# Patient Record
Sex: Female | Born: 1990
Health system: Southern US, Community
[De-identification: ages and names within clinical notes are randomized; demographics above are authoritative.]

## PROBLEM LIST (undated history)

## (undated) DIAGNOSIS — Z87442 Personal history of urinary calculi: Secondary | ICD-10-CM

## (undated) DIAGNOSIS — D649 Anemia, unspecified: Secondary | ICD-10-CM

## (undated) DIAGNOSIS — A048 Other specified bacterial intestinal infections: Secondary | ICD-10-CM

## (undated) HISTORY — DX: Other specified bacterial intestinal infections: A04.8

## (undated) HISTORY — PX: OTHER SURGICAL HISTORY: SHX169

---

## 2016-08-31 ENCOUNTER — Emergency Department (HOSPITAL_COMMUNITY): Payer: Self-pay

## 2016-08-31 ENCOUNTER — Emergency Department (HOSPITAL_COMMUNITY)
Admission: EM | Admit: 2016-08-31 | Discharge: 2016-08-31 | Disposition: A | Discharge status: Home / Self Care | Payer: Self-pay | Attending: Emergency Medicine | Admitting: Emergency Medicine

## 2016-08-31 ENCOUNTER — Encounter (HOSPITAL_COMMUNITY): Payer: Self-pay | Admitting: Emergency Medicine

## 2016-08-31 DIAGNOSIS — R112 Nausea with vomiting, unspecified: Secondary | ICD-10-CM

## 2016-08-31 DIAGNOSIS — N200 Calculus of kidney: Secondary | ICD-10-CM

## 2016-08-31 DIAGNOSIS — N201 Calculus of ureter: Secondary | ICD-10-CM | POA: Insufficient documentation

## 2016-08-31 DIAGNOSIS — R197 Diarrhea, unspecified: Secondary | ICD-10-CM | POA: Insufficient documentation

## 2016-08-31 LAB — URINALYSIS, ROUTINE W REFLEX MICROSCOPIC
Bilirubin Urine: NEGATIVE
Glucose, UA: NEGATIVE mg/dL
Ketones, ur: 20 mg/dL — AB
LEUKOCYTES UA: NEGATIVE
NITRITE: NEGATIVE
PROTEIN: 30 mg/dL — AB
SPECIFIC GRAVITY, URINE: 1.021 (ref 1.005–1.030)
pH: 5 (ref 5.0–8.0)

## 2016-08-31 LAB — CBC
HEMATOCRIT: 39.1 % (ref 36.0–46.0)
Hemoglobin: 12.9 g/dL (ref 12.0–15.0)
MCH: 28 pg (ref 26.0–34.0)
MCHC: 33 g/dL (ref 30.0–36.0)
MCV: 84.8 fL (ref 78.0–100.0)
PLATELETS: 377 10*3/uL (ref 150–400)
RBC: 4.61 MIL/uL (ref 3.87–5.11)
RDW: 13.6 % (ref 11.5–15.5)
WBC: 13.3 10*3/uL — AB (ref 4.0–10.5)

## 2016-08-31 LAB — I-STAT BETA HCG BLOOD, ED (MC, WL, AP ONLY)

## 2016-08-31 LAB — COMPREHENSIVE METABOLIC PANEL
ALBUMIN: 4.1 g/dL (ref 3.5–5.0)
ALT: 20 U/L (ref 14–54)
AST: 18 U/L (ref 15–41)
Alkaline Phosphatase: 39 U/L (ref 38–126)
Anion gap: 8 (ref 5–15)
BILIRUBIN TOTAL: 0.6 mg/dL (ref 0.3–1.2)
BUN: 7 mg/dL (ref 6–20)
CO2: 23 mmol/L (ref 22–32)
CREATININE: 0.86 mg/dL (ref 0.44–1.00)
Calcium: 9.1 mg/dL (ref 8.9–10.3)
Chloride: 107 mmol/L (ref 101–111)
GFR calc Af Amer: >60 mL/min (ref >60)
GLUCOSE: 155 mg/dL — AB (ref 65–99)
Potassium: 3.7 mmol/L (ref 3.5–5.1)
Sodium: 138 mmol/L (ref 135–145)
TOTAL PROTEIN: 6.9 g/dL (ref 6.5–8.1)

## 2016-08-31 LAB — LIPASE, BLOOD: Lipase: 27 U/L (ref 11–51)

## 2016-08-31 MED ORDER — NAPROXEN 500 MG PO TABS: 500.0000 mg | ORAL_TABLET | Freq: Two times a day (BID) | ORAL | 0 refills | Status: DC

## 2016-08-31 MED ORDER — HYDROCODONE-ACETAMINOPHEN 5-325 MG PO TABS: 1.0000 | ORAL_TABLET | Freq: Four times a day (QID) | ORAL | 0 refills | Status: DC | PRN

## 2016-08-31 MED ORDER — MORPHINE SULFATE (PF) 4 MG/ML IV SOLN
4.0000 mg | Freq: Once | INTRAVENOUS | Status: AC
  Administered 2016-08-31: 4 mg via INTRAVENOUS
  Filled 2016-08-31: qty 1

## 2016-08-31 MED ORDER — ONDANSETRON HCL 4 MG/2ML IJ SOLN
4.0000 mg | Freq: Once | INTRAMUSCULAR | Status: AC
  Administered 2016-08-31: 4 mg via INTRAVENOUS
  Filled 2016-08-31: qty 2

## 2016-08-31 MED ORDER — ONDANSETRON 4 MG PO TBDP: 4.0000 mg | ORAL_TABLET | Freq: Once | ORAL | Status: DC | PRN

## 2016-08-31 MED ORDER — ONDANSETRON 4 MG PO TBDP: 4.0000 mg | ORAL_TABLET | Freq: Three times a day (TID) | ORAL | 0 refills | Status: DC | PRN

## 2016-08-31 MED ORDER — KETOROLAC TROMETHAMINE 30 MG/ML IJ SOLN
30.0000 mg | Freq: Once | INTRAMUSCULAR | Status: AC
  Administered 2016-08-31: 30 mg via INTRAVENOUS
  Filled 2016-08-31: qty 1

## 2016-08-31 MED ORDER — SODIUM CHLORIDE 0.9 % IV BOLUS (SEPSIS)
1000.0000 mL | Freq: Once | INTRAVENOUS | Status: AC
  Administered 2016-08-31: 1000 mL via INTRAVENOUS

## 2016-08-31 MED ORDER — LACTATED RINGERS IV BOLUS (SEPSIS)
1000.0000 mL | Freq: Once | INTRAVENOUS | Status: AC
  Administered 2016-08-31: 1000 mL via INTRAVENOUS

## 2016-08-31 MED ORDER — FENTANYL CITRATE (PF) 100 MCG/2ML IJ SOLN
INTRAMUSCULAR | Status: AC
  Filled 2016-08-31: qty 2

## 2016-08-31 MED ORDER — FENTANYL CITRATE (PF) 100 MCG/2ML IJ SOLN
50.0000 ug | Freq: Once | INTRAMUSCULAR | Status: AC
  Administered 2016-08-31: 50 ug via INTRAVENOUS

## 2016-08-31 MED ORDER — ONDANSETRON 4 MG PO TBDP
ORAL_TABLET | ORAL | Status: AC
  Filled 2016-08-31: qty 1

## 2016-08-31 NOTE — ED Provider Notes (Signed)
[facility]-EMERGENCY DEPT Provider Note   CSN: [id number] Arrival date & time: 08/31/16  0253     History   Chief Complaint Chief Complaint  Patient presents with  . Abdominal Pain    HPI Stephanie Young is a 26 y.o. female.  Stephanie Young is a 26 y.o. female with no previous health hx presents to the ER this morning with a complaint of abdominal pain. The pain began 9 hours ago while she was getting ready for bed. She describes the pain as a sharp, cramping pain in her right side radiating through her abdomen to midline.  Pain comes in waves and has been associated with episodes of diarrhea (although she states diarrhea is a regular occurrence for her) as well as nausea and 5-6 episodes of non-bilious, non-bloody emesis. She denies fever, SOB, body aches or pain elsewhere, urinary sx, bloody urine, blood in stool, URI sx, dizziness, HA, numbness and tingling, and skin changes.  Rates her pain a 10/10. Took 2 Tylenol for sx but vomited it back up. Denies exotic foods, sick contacts, hx of STIs, bodily trauma, ETOH and drug use, and previous similar episodes. Last BM was last night, LMP was 08/18/16, she is sexually active. She does not have a PCP and does not take any regular medication. No surgical hx.   The history is provided by the patient, medical records and a parent. No language interpreter was used.  Abdominal Pain   Associated symptoms include diarrhea, nausea and vomiting. Pertinent negatives include fever, dysuria, frequency, hematuria and headaches.    History reviewed. No pertinent past medical history.  There are no active problems to display for this patient.   History reviewed. No pertinent surgical history.  OB History    No data available       Home Medications    Prior to Admission medications   Medication Sig Start Date End Date Taking? Authorizing Provider  HYDROcodone-acetaminophen (NORCO/VICODIN) 5-325 MG tablet Take 1-2 tablets by mouth every 6 (six)  hours as needed for moderate pain or severe pain. 08/31/16   Everlene Farrier, PA-C  naproxen (NAPROSYN) 500 MG tablet Take 1 tablet (500 mg total) by mouth 2 (two) times daily with a meal. 08/31/16   Everlene Farrier, PA-C  ondansetron (ZOFRAN ODT) 4 MG disintegrating tablet Take 1 tablet (4 mg total) by mouth every 8 (eight) hours as needed for nausea or vomiting. 08/31/16   Everlene Farrier, PA-C    Family History History reviewed. No pertinent family history.  Social History Social History  Substance Use Topics  . Smoking status: Never Smoker  . Smokeless tobacco: Never Used  . Alcohol use No     Allergies   Patient has no known allergies.   Review of Systems Review of Systems  Constitutional: Negative for chills and fever.  HENT: Negative for congestion and sore throat.   Eyes: Negative for visual disturbance.  Respiratory: Negative for cough and shortness of breath.   Cardiovascular: Negative for chest pain.  Gastrointestinal: Positive for abdominal pain, diarrhea, nausea and vomiting.  Genitourinary: Positive for flank pain. Negative for difficulty urinating, dysuria, frequency, hematuria, urgency, vaginal bleeding and vaginal discharge.  Musculoskeletal: Negative for back pain and neck pain.  Skin: Negative for rash.  Neurological: Negative for light-headedness and headaches.     Physical Exam Updated Vital Signs BP (!) 150/89   Pulse (!) 42   Temp 98.3 F (36.8 C) (Oral)   Resp 17   LMP 08/18/2016   SpO2 99%  Physical Exam  Constitutional: She appears well-developed and well-nourished. No distress.  Nontoxic appearing.  HENT:  Head: Normocephalic and atraumatic.  Mouth/Throat: Oropharynx is clear and moist.  Eyes: Conjunctivae are normal. Pupils are equal, round, and reactive to light. Right eye exhibits no discharge. Left eye exhibits no discharge.  Neck: Neck supple.  Cardiovascular: Normal rate, regular rhythm, normal heart sounds and intact distal pulses.   Exam reveals no gallop and no friction rub.   No murmur heard. Pulmonary/Chest: Effort normal and breath sounds normal. No respiratory distress. She has no wheezes. She has no rales.  Abdominal: Soft. Bowel sounds are normal. She exhibits no distension and no mass. There is tenderness. There is no rebound and no guarding.  Abdomen is soft bowel sounds are present. Patient has right flank and right lower quadrant tenderness to palpation.  Musculoskeletal: She exhibits no edema.  Lymphadenopathy:    She has no cervical adenopathy.  Neurological: She is alert. Coordination normal.  Skin: Skin is warm and dry. No rash noted. She is not diaphoretic. No erythema. No pallor.  Psychiatric: She has a normal mood and affect. Her behavior is normal.  Nursing note and vitals reviewed.    ED Treatments / Results  Labs (all labs ordered are listed, but only abnormal results are displayed) Labs Reviewed  COMPREHENSIVE METABOLIC PANEL - Abnormal; Notable for the following:       Result Value   Glucose, Bld 155 (*)    All other components within normal limits  CBC - Abnormal; Notable for the following:    WBC 13.3 (*)    All other components within normal limits  URINALYSIS, ROUTINE W REFLEX MICROSCOPIC - Abnormal; Notable for the following:    APPearance CLOUDY (*)    Hgb urine dipstick LARGE (*)    Ketones, ur 20 (*)    Protein, ur 30 (*)    Bacteria, UA MANY (*)    Squamous Epithelial / LPF 0-5 (*)    All other components within normal limits  LIPASE, BLOOD  I-STAT BETA HCG BLOOD, ED ([facility], [facility], [facility] ONLY)    EKG  EKG Interpretation None       Radiology Ct Renal Stone Study  Result Date: 08/31/2016 CLINICAL DATA:  Right-sided abdominal pain for several hours EXAM: CT ABDOMEN AND PELVIS WITHOUT CONTRAST TECHNIQUE: Multidetector CT imaging of the abdomen and pelvis was performed following the standard protocol without IV contrast. COMPARISON:  None. FINDINGS: Lower chest: No acute  abnormality. Hepatobiliary: No focal liver abnormality is seen. No gallstones, gallbladder wall thickening, or biliary dilatation. Pancreas: Unremarkable. No pancreatic ductal dilatation or surrounding inflammatory changes. Spleen: Normal in size without focal abnormality. Adrenals/Urinary Tract: The adrenal glands are within normal limits. The left kidney shows renal calculi or obstructive changes. The right kidney is enlarged and slightly decreased in attenuation with mild hydronephrosis and hydroureter. This extends inferiorly to the level of the ureterovesical junction. The bladder is decompressed. A 3 mm stone is noted in the right UVJ causing the obstructive change. Stomach/Bowel: Stomach is within normal limits. Appendix appears normal. No evidence of bowel wall thickening, distention, or inflammatory changes. Vascular/Lymphatic: No significant vascular findings are present. No enlarged abdominal or pelvic lymph nodes. Reproductive: Uterus and bilateral adnexa are unremarkable. Other: Minimal free pelvic fluid is likely physiologic in nature. 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 No other focal abnormality is noted. IMPRESSION: 3 mm right UVJ stone with mild hydronephrosis and hydroureter. Chronic  changes as described above. Electronically Signed   By: Alcide CleverMark  Lukens M.D.   On: 08/31/2016 11:13    Procedures Procedures (including critical care time)  Medications Ordered in ED Medications  ondansetron (ZOFRAN-ODT) disintegrating tablet 4 mg (not administered)  ondansetron (ZOFRAN-ODT) 4 MG disintegrating tablet (not administered)  fentaNYL (SUBLIMAZE) 100 MCG/2ML injection (not administered)  fentaNYL (SUBLIMAZE) injection 50 mcg (50 mcg Intravenous Given 08/31/16 0312)  sodium chloride 0.9 % bolus 1,000 mL (0 mLs Intravenous Stopped 08/31/16 1038)  ondansetron (ZOFRAN) injection 4 mg (4 mg Intravenous Given 08/31/16 0857)  morphine 4 MG/ML injection 4  mg (4 mg Intravenous Given 08/31/16 0857)  morphine 4 MG/ML injection 4 mg (4 mg Intravenous Given 08/31/16 1015)  lactated ringers bolus 1,000 mL (1,000 mLs Intravenous New Bag/Given 08/31/16 1201)  ketorolac (TORADOL) 30 MG/ML injection 30 mg (30 mg Intravenous Given 08/31/16 1201)     Initial Impression / Assessment and Plan / ED Course  I have reviewed the triage vital signs and the nursing notes.  Pertinent labs & imaging results that were available during my care of the patient were reviewed by me and considered in my medical decision making (see chart for details).    Patient presented to the emergency department to 9 hours of right-sided flank pain radiating to her right lower quadrant. It is colicky in nature. She denies history of previous abdominal surgeries. No history of kidney stones. On exam the patient is afebrile and nontoxic appearing. She has right flank pain and right lower quadrant abdominal tenderness to palpation. Urinalysis shows hematuria and no evidence of infection. Pregnancy test is negative. Lipase is within normal limits. CMP shows good kidney function. CBC is remarkable for mild leukocytosis of 13,000. CT renal stone study shows a 3 mm right UVJ stone with mild hydronephrosis and hydroureter. Appendix is normal. Reevaluation following Toradol and additional fluid bolus patient reports her pain has resolved. She is tolerating by mouth. We'll discharge with prescriptions for naproxen, Zofran and Norco. I discussed narcotic pain medicine precautions. The patient was [place] the [place] controlled substance database. No recent prescriptions for controlled substances. I discussed return precautions. I encouraged follow-up with urology as this is the first kidney stone she's had. I advised the patient to follow-up with their primary care provider this week. I advised the patient to return to the emergency department with new or worsening symptoms or new concerns. The  patient verbalized understanding and agreement with plan.      Final Clinical Impressions(s) / ED Diagnoses   Final diagnoses:  Kidney stone  Nausea vomiting and diarrhea    New Prescriptions New Prescriptions   HYDROCODONE-ACETAMINOPHEN (NORCO/VICODIN) 5-325 MG TABLET    Take 1-2 tablets by mouth every 6 (six) hours as needed for moderate pain or severe pain.   NAPROXEN (NAPROSYN) 500 MG TABLET    Take 1 tablet (500 mg total) by mouth 2 (two) times daily with a meal.   ONDANSETRON (ZOFRAN ODT) 4 MG DISINTEGRATING TABLET    Take 1 tablet (4 mg total) by mouth every 8 (eight) hours as needed for nausea or vomiting.     Everlene FarrierDansie, Deren Degrazia, PA-C 08/31/16 1301    Charlynne PanderYao, David Hsienta, MD 09/02/16 1106

## 2016-08-31 NOTE — ED Triage Notes (Signed)
Pt presents to ED for assessment of abdominal pain radiating to her side side, stated she has had diarrhea all day today, followed by central abdominal pain then multiple episodes of vomiting, and now pain radiates down right side of abdomen.

## 2016-08-31 NOTE — ED Notes (Signed)
Report given to Anna, RN 

## 2016-08-31 NOTE — ED Notes (Signed)
Back from ct scan

## 2016-08-31 NOTE — ED Notes (Signed)
Pt unable to urinate at this time.  

## 2016-08-31 NOTE — ED Notes (Signed)
Triage of level 2 was accidental.  Changed to level 3.

## 2016-08-31 NOTE — ED Notes (Signed)
Provider at bedside

## 2016-08-31 NOTE — ED Notes (Signed)
Pt complaining of pain coming back nurse notified

## 2016-08-31 NOTE — ED Notes (Signed)
Pt given glass of ice

## 2017-02-25 ENCOUNTER — Ambulatory Visit: Payer: Self-pay | Attending: Family Medicine | Admitting: Family Medicine

## 2017-02-25 ENCOUNTER — Other Ambulatory Visit: Payer: Self-pay

## 2017-02-25 ENCOUNTER — Encounter: Payer: Self-pay | Admitting: Family Medicine

## 2017-02-25 VITALS — Ht 64.5 in | Wt 196.0 lb

## 2017-02-25 DIAGNOSIS — R195 Other fecal abnormalities: Secondary | ICD-10-CM

## 2017-02-25 DIAGNOSIS — Z79899 Other long term (current) drug therapy: Secondary | ICD-10-CM | POA: Insufficient documentation

## 2017-02-25 DIAGNOSIS — E739 Lactose intolerance, unspecified: Secondary | ICD-10-CM | POA: Insufficient documentation

## 2017-02-25 DIAGNOSIS — M545 Low back pain, unspecified: Secondary | ICD-10-CM

## 2017-02-25 DIAGNOSIS — N926 Irregular menstruation, unspecified: Secondary | ICD-10-CM | POA: Insufficient documentation

## 2017-02-25 DIAGNOSIS — Z87442 Personal history of urinary calculi: Secondary | ICD-10-CM | POA: Insufficient documentation

## 2017-02-25 DIAGNOSIS — Z789 Other specified health status: Secondary | ICD-10-CM

## 2017-02-25 LAB — POCT URINALYSIS DIPSTICK
GLUCOSE UA: NEGATIVE
Nitrite, UA: NEGATIVE
PH UA: 6 (ref 5.0–8.0)
Protein, UA: 100
Spec Grav, UA: 1.025 (ref 1.010–1.025)
UROBILINOGEN UA: 0.2 U/dL

## 2017-02-25 LAB — POCT URINE PREGNANCY: Preg Test, Ur: NEGATIVE

## 2017-02-25 MED ORDER — IBUPROFEN 600 MG PO TABS: 600.0000 mg | ORAL_TABLET | Freq: Three times a day (TID) | ORAL | 0 refills | Status: DC | PRN

## 2017-02-25 MED ORDER — LACTASE 3000 UNITS PO TABS: ORAL_TABLET | ORAL | Status: DC

## 2017-02-25 NOTE — Progress Notes (Signed)
   Subjective:  Patient ID: Stephanie Young, female    DOB: 17-Mar-1990  Age: 26 y.o. MRN: [id number]  CC: Back Pain   HPI Stephanie Young presents for back pain.  She reports recent history of nephrolithiasis 2 months ago.  Back pain is left-sided and is worse at night.  She reports taking ibuprofen over-the-counter and Tylenol for symptoms.  She denies any dysuria nausea, or vomiting.  She is currently on menstrual cycle.  She does report frequent urination.  She reports seeing black particulates in her urine about 2 weeks ago denies any since.  Abdominal pain: More than 1 year ago.  Associated symptoms include loose stool.  She reports symptoms aggravated with dairy products and fatty foods.  Attempting to conceive: She reports history of irregular menstrual periods .  Menstrual cycles typically last 5-6 days.   Outpatient Medications Prior to Visit  Medication Sig Dispense Refill  . HYDROcodone-acetaminophen (NORCO/VICODIN) 5-325 MG tablet Take 1-2 tablets by mouth every 6 (six) hours as needed for moderate pain or severe pain. 12 tablet 0  . naproxen (NAPROSYN) 500 MG tablet Take 1 tablet (500 mg total) by mouth 2 (two) times daily with a meal. 30 tablet 0  . ondansetron (ZOFRAN ODT) 4 MG disintegrating tablet Take 1 tablet (4 mg total) by mouth every 8 (eight) hours as needed for nausea or vomiting. 10 tablet 0   No facility-administered medications prior to visit.     ROS Review of Systems  Constitutional: Negative.   Respiratory: Negative.   Cardiovascular: Negative.   Gastrointestinal: Positive for abdominal pain.  Genitourinary: Positive for menstrual problem.  Musculoskeletal: Positive for back pain.   Objective:  Ht 5' 4.5" (1.638 m)   Wt 196 lb (88.9 kg)   LMP 02/25/2017 (Exact Date)   BMI 33.12 kg/m   BP/Weight 02/25/2017 08/31/2016  Systolic BP - 101  Diastolic BP - 70  Wt. (Lbs) 196 -  BMI 33.12 -     Physical Exam  Constitutional: She appears well-developed  and well-nourished.  HENT:  Head: Normocephalic and atraumatic.  Right Ear: External ear normal.  Left Ear: External ear normal.  Nose: Nose normal.  Mouth/Throat: Oropharynx is clear and moist.  Cardiovascular: Normal rate, regular rhythm, normal heart sounds and intact distal pulses.  Pulmonary/Chest: Effort normal and breath sounds normal.  Abdominal: Soft. Bowel sounds are normal. There is no tenderness.  Musculoskeletal:       Lumbar back: She exhibits pain.  Psychiatric: She has a normal mood and affect.  Nursing note and vitals reviewed.   Assessment & Plan:   1. Acute left-sided low back pain without sciatica  - POCT urinalysis dipstick - ibuprofen (ADVIL,MOTRIN) 600 MG tablet; Take 1 tablet (600 mg total) by mouth every 8 (eight) hours as needed.  Dispense: 30 tablet; Refill: 0 - DG Abd 1 View; Future  2. History of nephrolithiasis  - DG Abd 1 View; Future  3. Irregular menstrual cycle  - FSH/LH - TSH - POCT urine pregnancy  4. Attempting to conceive  - POCT urine pregnancy  5. Lactose intolerance  - lactase (LACTAID) 3000 units tablet; Use as directed.  6. Loose stools Rule out any gallbladder disease or acute infection. - CMP and Liver - CBC     Follow-up: Return in about 6 weeks (around 04/08/2017) for Back pain/ irregular periods.   Lizbeth BarkMandesia R Eward Rutigliano FNP

## 2017-02-25 NOTE — Progress Notes (Signed)
Pt c/o back pain Concerns for UTI

## 2017-02-26 LAB — CMP AND LIVER
ALBUMIN: 4.9 g/dL (ref 3.5–5.5)
ALT: 17 IU/L (ref 0–32)
AST: 18 IU/L (ref 0–40)
Alkaline Phosphatase: 51 IU/L (ref 39–117)
BUN: 9 mg/dL (ref 6–20)
Bilirubin Total: 0.6 mg/dL (ref 0.0–1.2)
Bilirubin, Direct: 0.15 mg/dL (ref 0.00–0.40)
CALCIUM: 9.5 mg/dL (ref 8.7–10.2)
CO2: 21 mmol/L (ref 20–29)
CREATININE: 0.77 mg/dL (ref 0.57–1.00)
Chloride: 105 mmol/L (ref 96–106)
GFR, EST AFRICAN AMERICAN: 124 mL/min/{1.73_m2} (ref >59)
GFR, EST NON AFRICAN AMERICAN: 108 mL/min/{1.73_m2} (ref >59)
GLUCOSE: 80 mg/dL (ref 65–99)
POTASSIUM: 4.2 mmol/L (ref 3.5–5.2)
SODIUM: 141 mmol/L (ref 134–144)
TOTAL PROTEIN: 7.5 g/dL (ref 6.0–8.5)

## 2017-02-26 LAB — CBC
Hematocrit: 40.2 % (ref 34.0–46.6)
Hemoglobin: 13 g/dL (ref 11.1–15.9)
MCH: 27.5 pg (ref 26.6–33.0)
MCHC: 32.3 g/dL (ref 31.5–35.7)
MCV: 85 fL (ref 79–97)
PLATELETS: 414 10*3/uL — AB (ref 150–379)
RBC: 4.72 x10E6/uL (ref 3.77–5.28)
RDW: 13.8 % (ref 12.3–15.4)
WBC: 10.7 10*3/uL (ref 3.4–10.8)

## 2017-02-26 LAB — TSH: TSH: 0.424 u[IU]/mL — ABNORMAL LOW (ref 0.450–4.500)

## 2017-02-26 LAB — FSH/LH
FSH: 4.2 m[IU]/mL
LH: 2.5 m[IU]/mL

## 2017-03-07 ENCOUNTER — Telehealth: Payer: Self-pay | Admitting: *Deleted

## 2017-03-07 NOTE — Telephone Encounter (Signed)
Patient verified DOB Patient is aware of all labs being normal. Patient is aware of thyroid being slightly decreased. Patient is scheduled for 03/25/17 at 9:00am for TSH FU. No further questions.

## 2017-03-07 NOTE — Telephone Encounter (Signed)
-----   Message from Mandesia R Hairston, FNP sent at 03/03/2017  1:15 PM EST ----- Thyroid levels are slightly decreased which can indicate hyperthyroidism. Recommend follow up in 4 weeks to re-evaluate levels. Labs that evaluate ovarian function are normal. Labs normal.  Kidney function normal Liver function normal   

## 2017-03-07 NOTE — Telephone Encounter (Signed)
-----   Message from Lizbeth BarkMandesia R Hairston, FNP sent at 03/03/2017  1:15 PM EST ----- Thyroid levels are slightly decreased which can indicate hyperthyroidism. Recommend follow up in 4 weeks to re-evaluate levels. Labs that evaluate ovarian function are normal. Labs normal.  Kidney function normal Liver function normal

## 2017-03-25 ENCOUNTER — Ambulatory Visit: Payer: Self-pay | Attending: Family Medicine | Admitting: Family Medicine

## 2017-03-25 ENCOUNTER — Other Ambulatory Visit: Payer: Self-pay

## 2017-03-25 ENCOUNTER — Encounter: Payer: Self-pay | Admitting: Family Medicine

## 2017-03-25 VITALS — BP 121/76 | HR 74 | Temp 98.5°F | Resp 12 | Ht 64.0 in | Wt 194.0 lb

## 2017-03-25 DIAGNOSIS — G8929 Other chronic pain: Secondary | ICD-10-CM | POA: Insufficient documentation

## 2017-03-25 DIAGNOSIS — Z0000 Encounter for general adult medical examination without abnormal findings: Secondary | ICD-10-CM

## 2017-03-25 DIAGNOSIS — Z23 Encounter for immunization: Secondary | ICD-10-CM | POA: Insufficient documentation

## 2017-03-25 DIAGNOSIS — F4322 Adjustment disorder with anxiety: Secondary | ICD-10-CM | POA: Insufficient documentation

## 2017-03-25 DIAGNOSIS — R195 Other fecal abnormalities: Secondary | ICD-10-CM | POA: Insufficient documentation

## 2017-03-25 DIAGNOSIS — K219 Gastro-esophageal reflux disease without esophagitis: Secondary | ICD-10-CM | POA: Insufficient documentation

## 2017-03-25 DIAGNOSIS — R1084 Generalized abdominal pain: Secondary | ICD-10-CM | POA: Insufficient documentation

## 2017-03-25 DIAGNOSIS — Z1329 Encounter for screening for other suspected endocrine disorder: Secondary | ICD-10-CM | POA: Insufficient documentation

## 2017-03-25 LAB — POCT URINE PREGNANCY: PREG TEST UR: NEGATIVE

## 2017-03-25 MED ORDER — SIMETHICONE 80 MG PO CHEW: 80.0000 mg | CHEWABLE_TABLET | Freq: Four times a day (QID) | ORAL | 0 refills | Status: DC | PRN

## 2017-03-25 MED ORDER — RANITIDINE HCL 150 MG PO TABS: 150.0000 mg | ORAL_TABLET | Freq: Every day | ORAL | 2 refills | Status: DC | PRN

## 2017-03-25 NOTE — Progress Notes (Signed)
Subjective:  Patient ID: Stephanie Young, female    DOB: September 27, 1990  Age: 27 y.o. MRN: [id number]  CC: Follow-up   HPI Stephanie Young presents for follow up. Upon screening for thyroid disorder, tsh levels showed possible hyperthyroidism. Recommend re-evaluation of levels. She denies any cardiac symptoms. She does reports GI symptoms of chronic intermittent abdominal pain. Onset 5 years ago.  Symptoms include loose stools with every meal, flatulence, and nausea.  She reports episode of abdominal pain 2 years ago that was so severe she had difficulty drinking fluids for about 5 days.  She denies any present dysphasia.  She does report episode of blood in stool over a month ago.  She denies any history of hemorrhoids.  She does report anxious mood.  Risk factors include: Negative event-mother was diagnosed with cancer 5 years ago.  She denies any SI/HI.  She denies any interventions.  She declined speaking with LCSW or additional interventions at this time.      Outpatient Medications Prior to Visit  Medication Sig Dispense Refill  . ibuprofen (ADVIL,MOTRIN) 600 MG tablet Take 1 tablet (600 mg total) by mouth every 8 (eight) hours as needed. 30 tablet 0  . lactase (LACTAID) 3000 units tablet Use as directed. (Patient not taking: Reported on 03/25/2017)     No facility-administered medications prior to visit.     ROS Review of Systems  Constitutional: Negative.   Respiratory: Negative.   Cardiovascular: Negative.   Gastrointestinal: Positive for abdominal pain.  Psychiatric/Behavioral: The patient is nervous/anxious.     Objective:  BP 121/76 (BP Location: Left Arm, Patient Position: Sitting, Cuff Size: Normal)   Pulse 74   Temp 98.5 F (36.9 C) (Oral)   Resp 12   Ht 5\' 4"  (1.626 m)   Wt 194 lb (88 kg)   LMP 02/25/2017 (Exact Date)   BMI 33.30 kg/m   BP/Weight 03/25/2017 02/25/2017 08/31/2016  Systolic BP 121 - 101  Diastolic BP 76 - 70  Wt. (Lbs) 194 196 -  BMI 33.3 33.12  -     Physical Exam  Constitutional: She is oriented to person, place, and time. She appears well-developed and well-nourished.  HENT:  Head: Normocephalic and atraumatic.  Right Ear: External ear normal.  Left Ear: External ear normal.  Nose: Nose normal.  Mouth/Throat: Oropharynx is clear and moist.  Eyes: Conjunctivae are normal. Pupils are equal, round, and reactive to light.  Neck: Normal range of motion. Neck supple. No thyromegaly present.  Cardiovascular: Normal rate, regular rhythm, normal heart sounds and intact distal pulses.  Pulmonary/Chest: Effort normal and breath sounds normal.  Abdominal: Soft. Bowel sounds are normal. There is no tenderness.  Neurological: She is alert and oriented to person, place, and time.  Skin: Skin is warm and dry.  Psychiatric: Her mood appears anxious.  Nursing note and vitals reviewed.    Assessment & Plan:   1. Screening for thyroid disorder  - Thyroid Panel With TSH  2. Chronic generalized abdominal pain  - Ambulatory referral to Gastroenterology - POCT urine pregnancy - H. pylori breath test - simethicone (GAS-X) 80 MG chewable tablet; Chew 1 tablet (80 mg total) by mouth every 6 (six) hours as needed for flatulence.  Dispense: 30 tablet; Refill: 0  3. Loose stools  - Ambulatory referral to Gastroenterology  4. Gastroesophageal reflux disease without esophagitis  - ranitidine (ZANTAC) 150 MG tablet; Take 1 tablet (150 mg total) by mouth daily as needed for heartburn.  Dispense: 30 tablet; Refill:  2  5. Adjustment disorder with anxious mood  She declines speaking with LCSW or interventions at this time.  6. Needs flu shot  - Flu Vaccine QUAD 6+ mos PF IM (Fluarix Quad PF)  7. Healthcare maintenance  - HIV antibody (with reflex)       Follow-up: Return in about 8 weeks (around 05/20/2017) for GERD/Anxiety.   Lizbeth Bark FNP

## 2017-03-25 NOTE — Progress Notes (Signed)
6 week f/u  Concerns with kidney and abdomen Taking medication Ibuprofen prn

## 2017-03-25 NOTE — Patient Instructions (Signed)
Food Choices for Gastroesophageal Reflux Disease, Adult When you have gastroesophageal reflux disease (GERD), the foods you eat and your eating habits are very important. Choosing the right foods can help ease your discomfort. What guidelines do I need to follow?  Choose fruits, vegetables, whole grains, and low-fat dairy products.  Choose low-fat meat, fish, and poultry.  Limit fats such as oils, salad dressings, butter, nuts, and avocado.  Keep a food diary. This helps you identify foods that cause symptoms.  Avoid foods that cause symptoms. These may be different for everyone.  Eat small meals often instead of 3 large meals a day.  Eat your meals slowly, in a place where you are relaxed.  Limit fried foods.  Cook foods using methods other than frying.  Avoid drinking alcohol.  Avoid drinking large amounts of liquids with your meals.  Avoid bending over or lying down until 2-3 hours after eating. What foods are not recommended? These are some foods and drinks that may make your symptoms worse: Vegetables  Tomatoes. Tomato juice. Tomato and spaghetti sauce. Chili peppers. Onion and garlic. Horseradish. Fruits  Oranges, grapefruit, and lemon (fruit and juice). Meats  High-fat meats, fish, and poultry. This includes hot dogs, ribs, ham, sausage, salami, and bacon. Dairy  Whole milk and chocolate milk. Sour cream. Cream. Butter. Ice cream. Cream cheese. Drinks  Coffee and tea. Bubbly (carbonated) drinks or energy drinks. Condiments  Hot sauce. Barbecue sauce. Sweets/Desserts  Chocolate and cocoa. Donuts. Peppermint and spearmint. Fats and Oils  High-fat foods. This includes French fries and potato chips. Other  Vinegar. Strong spices. This includes black pepper, white pepper, red pepper, cayenne, curry powder, cloves, ginger, and chili powder. The items listed above may not be a complete list of foods and drinks to avoid. Contact your dietitian for more information.    This information is not intended to replace advice given to you by your health care provider. Make sure you discuss any questions you have with your health care provider. Document Released: 08/24/2011 Document Revised: 07/31/2015 Document Reviewed: 12/27/2012 Elsevier Interactive Patient Education  2017 [employer]  

## 2017-03-26 LAB — THYROID PANEL WITH TSH
Free Thyroxine Index: 2.5 (ref 1.2–4.9)
T3 Uptake Ratio: 26 % (ref 24–39)
T4, Total: 9.6 ug/dL (ref 4.5–12.0)
TSH: 0.586 u[IU]/mL (ref 0.450–4.500)

## 2017-03-26 LAB — HIV ANTIBODY (ROUTINE TESTING W REFLEX): HIV SCREEN 4TH GENERATION: NONREACTIVE

## 2017-03-27 LAB — H. PYLORI BREATH TEST: H pylori Breath Test: POSITIVE — AB

## 2017-03-29 ENCOUNTER — Telehealth: Payer: Self-pay | Admitting: Family Medicine

## 2017-03-29 ENCOUNTER — Other Ambulatory Visit: Payer: Self-pay | Admitting: Family Medicine

## 2017-03-29 DIAGNOSIS — A048 Other specified bacterial intestinal infections: Secondary | ICD-10-CM

## 2017-03-29 MED ORDER — CLARITHROMYCIN 500 MG PO TABS: 500.0000 mg | ORAL_TABLET | Freq: Two times a day (BID) | ORAL | 0 refills | Status: DC

## 2017-03-29 MED ORDER — OMEPRAZOLE 20 MG PO CPDR: 20.0000 mg | DELAYED_RELEASE_CAPSULE | Freq: Every day | ORAL | 0 refills | Status: DC

## 2017-03-29 MED ORDER — AMOXICILLIN 500 MG PO TABS
1000.0000 mg | ORAL_TABLET | Freq: Two times a day (BID) | ORAL | 0 refills | Status: DC
Start: 2017-03-29 — End: 2017-05-24

## 2017-03-29 NOTE — Telephone Encounter (Signed)
Patient called to check on lab results, please fu with patient.

## 2017-03-29 NOTE — Telephone Encounter (Signed)
Labs resulted pleas notify pt.

## 2017-03-31 NOTE — Telephone Encounter (Signed)
Patient verified DOB Patient is aware of lab results showing a positive h pylori and a negative thyroid and HIV. Patient advised to pick up and [facility] the two medications [facility].  No further questions at this time

## 2017-04-06 ENCOUNTER — Telehealth (INDEPENDENT_AMBULATORY_CARE_PROVIDER_SITE_OTHER): Payer: Self-pay | Admitting: *Deleted

## 2017-04-06 NOTE — Telephone Encounter (Signed)
Patient verified DOB Patient is aware of h pylori being positive and has began treatment. Patient is also are of HIV and thyroid being normal. No further questions.

## 2017-04-06 NOTE — Telephone Encounter (Signed)
-----   Message from Lizbeth BarkMandesia R Hairston, FNP sent at 03/29/2017  4:21 PM EST ----- -H.pylori is positive. H.pylori is a bacteria that can infect the stomach and cause stomach ulcers. You will be prescribed medications to treat. Thyroid screen is normal HIV negative

## 2017-05-20 ENCOUNTER — Ambulatory Visit: Payer: Self-pay | Admitting: Family Medicine

## 2017-05-24 ENCOUNTER — Ambulatory Visit: Payer: Self-pay | Attending: Nurse Practitioner | Admitting: Nurse Practitioner

## 2017-05-24 ENCOUNTER — Encounter: Payer: Self-pay | Admitting: Nurse Practitioner

## 2017-05-24 VITALS — BP 117/85 | HR 84 | Temp 98.8°F | Ht 64.0 in | Wt 185.0 lb

## 2017-05-24 DIAGNOSIS — R109 Unspecified abdominal pain: Secondary | ICD-10-CM | POA: Insufficient documentation

## 2017-05-24 DIAGNOSIS — Z79899 Other long term (current) drug therapy: Secondary | ICD-10-CM | POA: Insufficient documentation

## 2017-05-24 DIAGNOSIS — Z7689 Persons encountering health services in other specified circumstances: Secondary | ICD-10-CM | POA: Insufficient documentation

## 2017-05-24 NOTE — Patient Instructions (Addendum)
Health Maintenance, Female Adopting a healthy lifestyle and getting preventive care can go a long way to promote health and wellness. Talk with your health care provider about what schedule of regular examinations is right for you. This is a good chance for you to check in with your provider about disease prevention and staying healthy. In between checkups, there are plenty of things you can do on your own. Experts have done a lot of research about which lifestyle changes and preventive measures are most likely to keep you healthy. Ask your health care provider for more information. Weight and diet Eat a healthy diet  Be sure to include plenty of vegetables, fruits, low-fat dairy products, and lean protein.  Do not eat a lot of foods high in solid fats, added sugars, or salt.  Get regular exercise. This is one of the most important things you can do for your health. ? Most adults should exercise for at least 150 minutes each week. The exercise should increase your heart rate and make you sweat (moderate-intensity exercise). ? Most adults should also do strengthening exercises at least twice a week. This is in addition to the moderate-intensity exercise.  Maintain a healthy weight  Body mass index (BMI) is a measurement that can be used to identify possible weight problems. It estimates body fat based on height and weight. Your health care provider can help determine your BMI and help you achieve or maintain a healthy weight.  For females 20 years of age and older: ? A BMI below 18.5 is considered underweight. ? A BMI of 18.5 to 24.9 is normal. ? A BMI of 25 to 29.9 is considered overweight. ? A BMI of 30 and above is considered obese.  Watch levels of cholesterol and blood lipids  You should start having your blood tested for lipids and cholesterol at 26 years of age, then have this test every 5 years.  You may need to have your cholesterol levels checked more often if: ? Your lipid or  cholesterol levels are high. ? You are older than 26 years of age. ? You are at high risk for heart disease.  Cancer screening Lung Cancer  Lung cancer screening is recommended for adults 55-80 years old who are at high risk for lung cancer because of a history of smoking.  A yearly low-dose CT scan of the lungs is recommended for people who: ? Currently smoke. ? Have quit within the past 15 years. ? Have at least a 30-pack-year history of smoking. A pack year is smoking an average of one pack of cigarettes a day for 1 year.  Yearly screening should continue until it has been 15 years since you quit.  Yearly screening should stop if you develop a health problem that would prevent you from having lung cancer treatment.  Breast Cancer  Practice breast self-awareness. This means understanding how your breasts normally appear and feel.  It also means doing regular breast self-exams. Let your health care provider know about any changes, no matter how small.  If you are in your 20s or 30s, you should have a clinical breast exam (CBE) by a health care provider every 1-3 years as part of a regular health exam.  If you are 40 or older, have a CBE every year. Also consider having a breast X-ray (mammogram) every year.  If you have a family history of breast cancer, talk to your health care provider about genetic screening.  If you are at high risk   for breast cancer, talk to your health care provider about having an MRI and a mammogram every year.  Breast cancer gene (BRCA) assessment is recommended for women who have family members with BRCA-related cancers. BRCA-related cancers include: ? Breast. ? Ovarian. ? Tubal. ? Peritoneal cancers.  Results of the assessment will determine the need for genetic counseling and BRCA1 and BRCA2 testing.  Cervical Cancer Your health care provider may recommend that you be screened regularly for cancer of the pelvic organs (ovaries, uterus, and  vagina). This screening involves a pelvic examination, including checking for microscopic changes to the surface of your cervix (Pap test). You may be encouraged to have this screening done every 3 years, beginning at age 22.  For women ages 56-65, health care providers may recommend pelvic exams and Pap testing every 3 years, or they may recommend the Pap and pelvic exam, combined with testing for human papilloma virus (HPV), every 5 years. Some types of HPV increase your risk of cervical cancer. Testing for HPV may also be done on women of any age with unclear Pap test results.  Other health care providers may not recommend any screening for nonpregnant women who are considered low risk for pelvic cancer and who do not have symptoms. Ask your health care provider if a screening pelvic exam is right for you.  If you have had past treatment for cervical cancer or a condition that could lead to cancer, you need Pap tests and screening for cancer for at least 20 years after your treatment. If Pap tests have been discontinued, your risk factors (such as having a new sexual partner) need to be reassessed to determine if screening should resume. Some women have medical problems that increase the chance of getting cervical cancer. In these cases, your health care provider may recommend more frequent screening and Pap tests.  Colorectal Cancer  This type of cancer can be detected and often prevented.  Routine colorectal cancer screening usually begins at 26 years of age and continues through 26 years of age.  Your health care provider may recommend screening at an earlier age if you have risk factors for colon cancer.  Your health care provider may also recommend using home test kits to check for hidden blood in the stool.  A small camera at the end of a tube can be used to examine your colon directly (sigmoidoscopy or colonoscopy). This is done to check for the earliest forms of colorectal  cancer.  Routine screening usually begins at age 33.  Direct examination of the colon should be repeated every 5-10 years through 26 years of age. However, you may need to be screened more often if early forms of precancerous polyps or small growths are found.  Skin Cancer  Check your skin from head to toe regularly.  Tell your health care provider about any new moles or changes in moles, especially if there is a change in a mole's shape or color.  Also tell your health care provider if you have a mole that is larger than the size of a pencil eraser.  Always use sunscreen. Apply sunscreen liberally and repeatedly throughout the day.  Protect yourself by wearing long sleeves, pants, a wide-brimmed hat, and sunglasses whenever you are outside.  Heart disease, diabetes, and high blood pressure  High blood pressure causes heart disease and increases the risk of stroke. High blood pressure is more likely to develop in: ? People who have blood pressure in the high end of  the normal range (130-139/85-89 mm Hg). ? People who are overweight or obese. ? People who are African American.  If you are 21-29 years of age, have your blood pressure checked every 3-5 years. If you are 3 years of age or older, have your blood pressure checked every year. You should have your blood pressure measured twice-once when you are at a hospital or clinic, and once when you are not at a hospital or clinic. Record the average of the two measurements. To check your blood pressure when you are not at a hospital or clinic, you can use: ? An automated blood pressure machine at a pharmacy. ? A home blood pressure monitor.  If you are between 17 years and 37 years old, ask your health care provider if you should take aspirin to prevent strokes.  Have regular diabetes screenings. This involves taking a blood sample to check your fasting blood sugar level. ? If you are at a normal weight and have a low risk for diabetes,  have this test once every three years after 26 years of age. ? If you are overweight and have a high risk for diabetes, consider being tested at a younger age or more often. Preventing infection Hepatitis B  If you have a higher risk for hepatitis B, you should be screened for this virus. You are considered at high risk for hepatitis B if: ? You were born in a country where hepatitis B is common. Ask your health care provider which countries are considered high risk. ? Your parents were born in a high-risk country, and you have not been immunized against hepatitis B (hepatitis B vaccine). ? You have HIV or AIDS. ? You use needles to inject street drugs. ? You live with someone who has hepatitis B. ? You have had sex with someone who has hepatitis B. ? You get hemodialysis treatment. ? You take certain medicines for conditions, including cancer, organ transplantation, and autoimmune conditions.  Hepatitis C  Blood testing is recommended for: ? Everyone born from [birth year] through 1964. ? Anyone with known risk factors for hepatitis C.  Sexually transmitted infections (STIs)  You should be screened for sexually transmitted infections (STIs) including gonorrhea and chlamydia if: ? You are sexually active and are younger than 26 years of age. ? You are older than 26 years of age and your health care provider tells you that you are at risk for this type of infection. ? Your sexual activity has changed since you were last screened and you are at an increased risk for chlamydia or gonorrhea. Ask your health care provider if you are at risk.  If you do not have HIV, but are at risk, it may be recommended that you take a prescription medicine daily to prevent HIV infection. This is called pre-exposure prophylaxis (PrEP). You are considered at risk if: ? You are sexually active and do not regularly use condoms or know the HIV status of your partner(s). ? You take drugs by injection. ? You are  sexually active with a partner who has HIV.  Talk with your health care provider about whether you are at high risk of being infected with HIV. If you choose to begin PrEP, you should first be tested for HIV. You should then be tested every 3 months for as long as you are taking PrEP. Pregnancy  If you are premenopausal and you may become pregnant, ask your health care provider about preconception counseling.  If you may become  pregnant, take 400 to 800 micrograms (mcg) of folic acid every day.  If you want to prevent pregnancy, talk to your health care provider about birth control (contraception). Osteoporosis and menopause  Osteoporosis is a disease in which the bones lose minerals and strength with aging. This can result in serious bone fractures. Your risk for osteoporosis can be identified using a bone density scan.  If you are 82 years of age or older, or if you are at risk for osteoporosis and fractures, ask your health care provider if you should be screened.  Ask your health care provider whether you should take a calcium or vitamin D supplement to lower your risk for osteoporosis.  Menopause may have certain physical symptoms and risks.  Hormone replacement therapy may reduce some of these symptoms and risks. Talk to your health care provider about whether hormone replacement therapy is right for you. Follow these instructions at home:  Schedule regular health, dental, and eye exams.  Stay current with your immunizations.  Do not use any tobacco products including cigarettes, chewing tobacco, or electronic cigarettes.  If you are pregnant, do not drink alcohol.  If you are breastfeeding, limit how much and how often you drink alcohol.  Limit alcohol intake to no more than 1 drink per day for nonpregnant women. One drink equals 12 ounces of beer, 5 ounces of wine, or 1 ounces of hard liquor.  Do not use street drugs.  Do not share needles.  Ask your health care  provider for help if you need support or information about quitting drugs.  Tell your health care provider if you often feel depressed.  Tell your health care provider if you have ever been abused or do not feel safe at home. This information is not intended to replace advice given to you by your health care provider. Make sure you discuss any questions you have with your health care provider. Document Released: 09/07/2010 Document Revised: 07/31/2015 Document Reviewed: 11/26/2014 Elsevier Interactive Patient Education  2018 [place] Maintenance, Female Adopting a healthy lifestyle and getting preventive care can go a long way to promote health and wellness. Talk with your health care provider about what schedule of regular examinations is right for you. This is a good chance for you to check in with your provider about disease prevention and staying healthy. In between checkups, there are plenty of things you can do on your own. Experts have done a lot of research about which lifestyle changes and preventive measures are most likely to keep you healthy. Ask your health care provider for more information. Weight and diet Eat a healthy diet  Be sure to include plenty of vegetables, fruits, low-fat dairy products, and lean protein.  Do not eat a lot of foods high in solid fats, added sugars, or salt.  Get regular exercise. This is one of the most important things you can do for your health. ? Most adults should exercise for at least 150 minutes each week. The exercise should increase your heart rate and make you sweat (moderate-intensity exercise). ? Most adults should also do strengthening exercises at least twice a week. This is in addition to the moderate-intensity exercise.  Maintain a healthy weight  Body mass index (BMI) is a measurement that can be used to identify possible weight problems. It estimates body fat based on height and weight. Your health care provider can help  determine your BMI and help you achieve or maintain a healthy weight.  For  females 62 years of age and older: ? A BMI below 18.5 is considered underweight. ? A BMI of 18.5 to 24.9 is normal. ? A BMI of 25 to 29.9 is considered overweight. ? A BMI of 30 and above is considered obese.  Watch levels of cholesterol and blood lipids  You should start having your blood tested for lipids and cholesterol at 26 years of age, then have this test every 5 years.  You may need to have your cholesterol levels checked more often if: ? Your lipid or cholesterol levels are high. ? You are older than 26 years of age. ? You are at high risk for heart disease.  Cancer screening Lung Cancer  Lung cancer screening is recommended for adults 43-33 years old who are at high risk for lung cancer because of a history of smoking.  A yearly low-dose CT scan of the lungs is recommended for people who: ? Currently smoke. ? Have quit within the past 15 years. ? Have at least a 30-pack-year history of smoking. A pack year is smoking an average of one pack of cigarettes a day for 1 year.  Yearly screening should continue until it has been 15 years since you quit.  Yearly screening should stop if you develop a health problem that would prevent you from having lung cancer treatment.  Breast Cancer  Practice breast self-awareness. This means understanding how your breasts normally appear and feel.  It also means doing regular breast self-exams. Let your health care provider know about any changes, no matter how small.  If you are in your 20s or 30s, you should have a clinical breast exam (CBE) by a health care provider every 1-3 years as part of a regular health exam.  If you are 70 or older, have a CBE every year. Also consider having a breast X-ray (mammogram) every year.  If you have a family history of breast cancer, talk to your health care provider about genetic screening.  If you are at high risk for  breast cancer, talk to your health care provider about having an MRI and a mammogram every year.  Breast cancer gene (BRCA) assessment is recommended for women who have family members with BRCA-related cancers. BRCA-related cancers include: ? Breast. ? Ovarian. ? Tubal. ? Peritoneal cancers.  Results of the assessment will determine the need for genetic counseling and BRCA1 and BRCA2 testing.  Cervical Cancer Your health care provider may recommend that you be screened regularly for cancer of the pelvic organs (ovaries, uterus, and vagina). This screening involves a pelvic examination, including checking for microscopic changes to the surface of your cervix (Pap test). You may be encouraged to have this screening done every 3 years, beginning at age 21.  For women ages 42-65, health care providers may recommend pelvic exams and Pap testing every 3 years, or they may recommend the Pap and pelvic exam, combined with testing for human papilloma virus (HPV), every 5 years. Some types of HPV increase your risk of cervical cancer. Testing for HPV may also be done on women of any age with unclear Pap test results.  Other health care providers may not recommend any screening for nonpregnant women who are considered low risk for pelvic cancer and who do not have symptoms. Ask your health care provider if a screening pelvic exam is right for you.  If you have had past treatment for cervical cancer or a condition that could lead to cancer, you need Pap tests and  screening for cancer for at least 20 years after your treatment. If Pap tests have been discontinued, your risk factors (such as having a new sexual partner) need to be reassessed to determine if screening should resume. Some women have medical problems that increase the chance of getting cervical cancer. In these cases, your health care provider may recommend more frequent screening and Pap tests.  Colorectal Cancer  This type of cancer can be  detected and often prevented.  Routine colorectal cancer screening usually begins at 26 years of age and continues through 26 years of age.  Your health care provider may recommend screening at an earlier age if you have risk factors for colon cancer.  Your health care provider may also recommend using home test kits to check for hidden blood in the stool.  A small camera at the end of a tube can be used to examine your colon directly (sigmoidoscopy or colonoscopy). This is done to check for the earliest forms of colorectal cancer.  Routine screening usually begins at age 81.  Direct examination of the colon should be repeated every 5-10 years through 25 years of age. However, you may need to be screened more often if early forms of precancerous polyps or small growths are found.  Skin Cancer  Check your skin from head to toe regularly.  Tell your health care provider about any new moles or changes in moles, especially if there is a change in a mole's shape or color.  Also tell your health care provider if you have a mole that is larger than the size of a pencil eraser.  Always use sunscreen. Apply sunscreen liberally and repeatedly throughout the day.  Protect yourself by wearing long sleeves, pants, a wide-brimmed hat, and sunglasses whenever you are outside.  Heart disease, diabetes, and high blood pressure  High blood pressure causes heart disease and increases the risk of stroke. High blood pressure is more likely to develop in: ? People who have blood pressure in the high end of the normal range (130-139/85-89 mm Hg). ? People who are overweight or obese. ? People who are African American.  If you are 53-71 years of age, have your blood pressure checked every 3-5 years. If you are 5 years of age or older, have your blood pressure checked every year. You should have your blood pressure measured twice-once when you are at a hospital or clinic, and once when you are not at a  hospital or clinic. Record the average of the two measurements. To check your blood pressure when you are not at a hospital or clinic, you can use: ? An automated blood pressure machine at a pharmacy. ? A home blood pressure monitor.  If you are between 28 years and 69 years old, ask your health care provider if you should take aspirin to prevent strokes.  Have regular diabetes screenings. This involves taking a blood sample to check your fasting blood sugar level. ? If you are at a normal weight and have a low risk for diabetes, have this test once every three years after 26 years of age. ? If you are overweight and have a high risk for diabetes, consider being tested at a younger age or more often. Preventing infection Hepatitis B  If you have a higher risk for hepatitis B, you should be screened for this virus. You are considered at high risk for hepatitis B if: ? You were born in a country where hepatitis B is common. Ask  your health care provider which countries are considered high risk. ? Your parents were born in a high-risk country, and you have not been immunized against hepatitis B (hepatitis B vaccine). ? You have HIV or AIDS. ? You use needles to inject street drugs. ? You live with someone who has hepatitis B. ? You have had sex with someone who has hepatitis B. ? You get hemodialysis treatment. ? You take certain medicines for conditions, including cancer, organ transplantation, and autoimmune conditions.  Hepatitis C  Blood testing is recommended for: ? Everyone born from [birth year] through 1964. ? Anyone with known risk factors for hepatitis C.  Sexually transmitted infections (STIs)  You should be screened for sexually transmitted infections (STIs) including gonorrhea and chlamydia if: ? You are sexually active and are younger than 26 years of age. ? You are older than 26 years of age and your health care provider tells you that you are at risk for this type of  infection. ? Your sexual activity has changed since you were last screened and you are at an increased risk for chlamydia or gonorrhea. Ask your health care provider if you are at risk.  If you do not have HIV, but are at risk, it may be recommended that you take a prescription medicine daily to prevent HIV infection. This is called pre-exposure prophylaxis (PrEP). You are considered at risk if: ? You are sexually active and do not regularly use condoms or know the HIV status of your partner(s). ? You take drugs by injection. ? You are sexually active with a partner who has HIV.  Talk with your health care provider about whether you are at high risk of being infected with HIV. If you choose to begin PrEP, you should first be tested for HIV. You should then be tested every 3 months for as long as you are taking PrEP. Pregnancy  If you are premenopausal and you may become pregnant, ask your health care provider about preconception counseling.  If you may become pregnant, take 400 to 800 micrograms (mcg) of folic acid every day.  If you want to prevent pregnancy, talk to your health care provider about birth control (contraception). Osteoporosis and menopause  Osteoporosis is a disease in which the bones lose minerals and strength with aging. This can result in serious bone fractures. Your risk for osteoporosis can be identified using a bone density scan.  If you are 38 years of age or older, or if you are at risk for osteoporosis and fractures, ask your health care provider if you should be screened.  Ask your health care provider whether you should take a calcium or vitamin D supplement to lower your risk for osteoporosis.  Menopause may have certain physical symptoms and risks.  Hormone replacement therapy may reduce some of these symptoms and risks. Talk to your health care provider about whether hormone replacement therapy is right for you. Follow these instructions at home:  Schedule  regular health, dental, and eye exams.  Stay current with your immunizations.  Do not use any tobacco products including cigarettes, chewing tobacco, or electronic cigarettes.  If you are pregnant, do not drink alcohol.  If you are breastfeeding, limit how much and how often you drink alcohol.  Limit alcohol intake to no more than 1 drink per day for nonpregnant women. One drink equals 12 ounces of beer, 5 ounces of wine, or 1 ounces of hard liquor.  Do not use street drugs.  Do not share  needles.  Ask your health care provider for help if you need support or information about quitting drugs.  Tell your health care provider if you often feel depressed.  Tell your health care provider if you have ever been abused or do not feel safe at home. This information is not intended to replace advice given to you by your health care provider. Make sure you discuss any questions you have with your health care provider. Document Released: 09/07/2010 Document Revised: 07/31/2015 Document Reviewed: 11/26/2014 Elsevier Interactive Patient Education  [employer]. Infertility Infertility is when you are unable to get pregnant (conceive) after a year of having sex regularly without using birth control. Infertility can also mean that a woman is not able to carry a pregnancy to full term. Both women and men can have fertility problems. What causes infertility? What Causes Infertility in Women? There are many possible causes of infertility in women. For some women, the cause of infertility is not known (unexplained infertility). Infertility can also be linked to more than one cause. Infertility problems in women can be caused by problems with the menstrual cycle or reproductive organs, certain medical conditions, and factors related to lifestyle and age.  Problems with your menstrual cycle can interfere with your ovaries producing eggs (ovulation). This can make it difficult to get pregnant. This  includes having a menstrual cycle that is very long, very short, or irregular.  Problems with reproductive organs can include: ? An abnormally narrow cervix or a cervix that does not remain closed during a pregnancy. ? A blockage in your fallopian tubes. ? An abnormally shaped uterus. ? Uterine fibroids. This is a tissue mass (tumor) that can develop on your uterus.  Medical conditions that can affect a woman's fertility include: ? Polycystic ovarian syndrome (PCOS). This is a hormonal disorder that can cause small cysts to grow on your ovaries. This is the most common cause of infertility in women. ? Endometriosis. This is a condition in which the tissue that lines your uterus (endometrium) grows outside of its normal location. ? Primary ovary insufficiency. This is when your ovaries stop producing eggs and hormones before the age of 92. ? Sexually transmitted diseases, such as chlamydia or gonorrhea. These infections can cause scarring in your fallopian tubes. This makes it difficult for eggs to reach your uterus. ? Autoimmune disorders. These are disorders in which your immune system attacks normal, healthy cells. ? Hormone imbalances.  Other factors include: ? Age. A woman's fertility declines with age, especially after her mid-59s. ? Being under- or overweight. ? Drinking too much alcohol. ? Using drugs. ? Exercising excessively. ? Being exposed to environmental toxins, such as radiation, pesticides, and certain chemicals.  What Causes Infertility in Men? There are many causes of infertility in men. Infertility can be linked to more than one cause. Infertility problems in men can be caused by problems with sperm or the reproductive organs, certain medical conditions, and factors related to lifestyle and age. Some men have unexplained infertility.  Problems with sperm. Infertility can result if there is a problem producing: ? Enough sperm (low sperm count). ? Enough normally-shaped  sperm (sperm morphology). ? Sperm that are able to reach the egg (poor motility).  Infertility can also be caused by: ? A problem with hormones. ? Enlarged veins (varicoceles), cysts (spermatoceles), or tumors of the testicles. ? Sexual dysfunction. ? Injury to the testicles. ? A birth defect, such as not having the tubes that carry sperm (vas deferens).  Medical conditions that can affect a man's fertility include: ? Diabetes. ? Cancer treatments, such as chemotherapy or radiation. ? Klinefelter syndrome. This is an inherited genetic disorder. ? Thyroid problems, such as an under- or overactive thyroid. ? Cystic fibrosis. ? Sexually transmitted diseases.  Other factors include: ? Age. A man's fertility declines with age. ? Drinking too much alcohol. ? Using drugs. ? Being exposed to environmental toxins, such as pesticides and lead.  What are the symptoms of infertility? Being unable to get pregnant after one year of having regular sex without using birth control is the only sign of infertility. How is infertility diagnosed? In order to be diagnosed with infertility, both partners will have a physical exam. Both partners will also have an extensive medical and sexual history taken. If there is no obvious reason for infertility, additional tests may be done. What Tests Will Women Have? Women may first have tests to check whether they are ovulating each month. The tests may include:  Blood tests to check hormone levels.  An ultrasound of the ovaries. This looks for possible problems on or in the ovaries.  Taking a small sample of the tissue that lines the uterus for examination under a microscope (endometrial biopsy).  Women who are ovulating may have additional tests. These may include:  Hysterosalpingography. ? This is an X-ray of the fallopian tubes and uterus taken after a specific type of dye is injected. ? This test can show the shape of the uterus and whether the  fallopian tubes are open.  Laparoscopy. ? In this test, a lighted tube (laparoscope) is used to look for problems in the fallopian tubes and other female organs.  Transvaginal ultrasound. ? This is an imaging test to check for abnormalities of the uterus and ovaries. ? A health care provider can use this test to count the number of follicles on the ovaries.  Hysteroscopy. ? This test involves using a lighted tube to examine the cervix and inside the uterus. ? It is done to find any abnormalities inside the uterus.  What Tests Will Men Have? Tests for men's infertility includes:  Semen tests to check sperm count, morphology, and motility.  Blood tests to check for hormone levels.  Taking a small sample of tissue from inside a testicle (biopsy). This is examined under a microscope.  Blood tests to check for genetic abnormalities (genetic testing).  How are women treated for infertility? Treatment depends on the cause of infertility. Most cases of infertility in women are treated with medicine or surgery.  Women may take medicine to: ? Correct ovulation problems. ? Treat other health conditions, such as PCOS.  Surgery may be done to: ? Repair damage to the ovaries, fallopian tubes, cervix, or uterus. ? Remove growths from the uterus. ? Remove scar tissue from the uterus, pelvis, or other female organs.  How are men treated for infertility? Treatment depends on the cause of infertility. Most cases of infertility in men are treated with medicine or surgery.  Men may take medicine to: ? Correct hormone problems. ? Treat other health conditions. ? Treat sexual dysfunction.  Surgery may be done to: ? Remove blockages in the reproductive tract. ? Correct other structural problems of the reproductive tract.  What is assisted reproductive technology? Assisted reproductive technology (ART) refers to all treatments and procedures that combine eggs and sperm outside the body to try  to help a couple conceive. ART is often combined with fertility drugs to stimulate ovulation. Sometimes ART  is done using eggs retrieved from another woman's body (donor eggs) or from previously frozen fertilized eggs (embryos). There are different types of ART. These include:  Intrauterine insemination (IUI). ? In this procedure, sperm is placed directly into a woman's uterus with a long, thin tube. ? This may be most effective for infertility caused by sperm problems, including low sperm count and low motility. ? Can be used in combination with fertility drugs.  In vitro fertilization (IVF). ? This is often done when a woman's fallopian tubes are blocked or when a man has low sperm counts. ? Fertility drugs stimulate the ovaries to produce multiple eggs. Once mature, these eggs are removed from the body and combined with the sperm to be fertilized. ? These fertilized eggs are then placed in the woman's uterus.  This information is not intended to replace advice given to you by your health care provider. Make sure you discuss any questions you have with your health care provider. Document Released: 02/25/2003 Document Revised: 07/25/2015 Document Reviewed: 11/07/2013 Elsevier Interactive Patient Education  2018 [employer].

## 2017-05-24 NOTE — Progress Notes (Signed)
Assessment & Plan:  Stephanie Young was seen today for establish care.  Diagnoses and all orders for this visit:  Abdominal pain, unspecified abdominal location Resolved   Patient has been counseled on age-appropriate routine health concerns for screening and prevention. These are reviewed and up-to-date. Referrals have been placed accordingly. Immunizations are up-to-date or declined.    Subjective:   Chief Complaint  Patient presents with  . Establish Care    Pt's establish care. Pt's would like to talk to PCP about getting a pap smear next time she's here. Stomach is much better now.    HPI Stephanie SchneidersJuliana Young 27 y.o. female presents to office today to establish care and for follow up of abdominal pain. She is due for PAP. Will schedule for next office visit.   Abdominal Pain She was diagnosed and treated for Hpylori a few months ago. Today she reports abdominal pain has greatly improved. She endorses previous medication compliance to treat hpylori.    Review of Systems  Constitutional: Negative for fever, malaise/fatigue and weight loss.  HENT: Negative.  Negative for nosebleeds.   Eyes: Negative.  Negative for blurred vision, double vision and photophobia.  Respiratory: Negative.  Negative for cough and shortness of breath.   Cardiovascular: Negative.  Negative for chest pain, palpitations and leg swelling.  Gastrointestinal: Negative.  Negative for abdominal pain, constipation, diarrhea, heartburn, nausea and vomiting.  Musculoskeletal: Negative.  Negative for myalgias.  Neurological: Negative.  Negative for dizziness, focal weakness, seizures and headaches.  Psychiatric/Behavioral: Negative.  Negative for suicidal ideas.    History reviewed. No pertinent past medical history.  History reviewed. No pertinent surgical history.  Family History  Problem Relation Age of Onset  . Breast cancer Mother   . Hypertension Mother   . Hypertension Brother   . Diabetes Maternal Uncle     . Diabetes Other     Social History Reviewed with no changes to be made today.   Outpatient Medications Prior to Visit  Medication Sig Dispense Refill  . amoxicillin (AMOXIL) 500 MG tablet Take 2 tablets (1,000 mg total) by mouth 2 (two) times daily. (Patient not taking: Reported on 05/24/2017) 56 tablet 0  . clarithromycin (BIAXIN) 500 MG tablet Take 1 tablet (500 mg total) by mouth 2 (two) times daily. (Patient not taking: Reported on 05/24/2017) 28 tablet 0  . ibuprofen (ADVIL,MOTRIN) 600 MG tablet Take 1 tablet (600 mg total) by mouth every 8 (eight) hours as needed. (Patient not taking: Reported on 05/24/2017) 30 tablet 0  . lactase (LACTAID) 3000 units tablet Use as directed. (Patient not taking: Reported on 03/25/2017)    . omeprazole (PRILOSEC) 20 MG capsule Take 1 capsule (20 mg total) by mouth daily. (Patient not taking: Reported on 05/24/2017) 28 capsule 0  . ranitidine (ZANTAC) 150 MG tablet Take 1 tablet (150 mg total) by mouth daily as needed for heartburn. (Patient not taking: Reported on 05/24/2017) 30 tablet 2  . simethicone (GAS-X) 80 MG chewable tablet Chew 1 tablet (80 mg total) by mouth every 6 (six) hours as needed for flatulence. (Patient not taking: Reported on 05/24/2017) 30 tablet 0   No facility-administered medications prior to visit.     No Known Allergies     Objective:    BP 117/85 (BP Location: Right Arm, Patient Position: Sitting, Cuff Size: Normal)   Pulse 84   Temp 98.8 F (37.1 C) (Oral)   Ht 5\' 4"  (1.626 m)   Wt 185 lb (83.9 kg)   LMP  04/28/2017   SpO2 99%   BMI 31.76 kg/m  Wt Readings from Last 3 Encounters:  05/24/17 185 lb (83.9 kg)  03/25/17 194 lb (88 kg)  02/25/17 196 lb (88.9 kg)    Physical Exam  Constitutional: She is oriented to person, place, and time. She appears well-developed and well-nourished. She is cooperative.  HENT:  Head: Normocephalic and atraumatic.  Eyes: EOM are normal.  Neck: Normal range of motion. Thyromegaly  present.  Cardiovascular: Normal rate, regular rhythm and normal heart sounds. Exam reveals no gallop and no friction rub.  No murmur heard. Pulmonary/Chest: Effort normal and breath sounds normal. No tachypnea. No respiratory distress. She has no decreased breath sounds. She has no wheezes. She has no rhonchi. She has no rales. She exhibits no tenderness.  Abdominal: Soft. Bowel sounds are normal.  Musculoskeletal: Normal range of motion. She exhibits no edema.  Neurological: She is alert and oriented to person, place, and time. Coordination normal.  Skin: Skin is warm and dry.  Psychiatric: She has a normal mood and affect. Her behavior is normal. Judgment and thought content normal.  Nursing note and vitals reviewed.        Patient has been counseled extensively about nutrition and exercise as well as the importance of adherence with medications and regular follow-up. The patient was given clear instructions to go to ER or return to medical center if symptoms don't improve, worsen or new problems develop. The patient verbalized understanding.   Follow-up: Return for PAP SMEAR, Needs appointment with financial representative.Claiborne Rigg, FNP-BC [facility] [place], [place] [phone]   05/24/2017, 10:44 AM

## 2017-05-30 ENCOUNTER — Ambulatory Visit: Payer: Self-pay

## 2017-06-20 ENCOUNTER — Encounter: Payer: Self-pay | Admitting: Internal Medicine

## 2017-06-29 ENCOUNTER — Encounter

## 2017-06-29 ENCOUNTER — Ambulatory Visit: Payer: Self-pay | Admitting: Nurse Practitioner

## 2017-08-23 ENCOUNTER — Encounter: Payer: Self-pay | Admitting: Nurse Practitioner

## 2017-08-23 ENCOUNTER — Other Ambulatory Visit: Payer: Self-pay

## 2017-08-23 ENCOUNTER — Ambulatory Visit: Payer: Self-pay | Attending: Nurse Practitioner | Admitting: Nurse Practitioner

## 2017-08-23 VITALS — BP 117/82 | HR 88 | Temp 98.3°F | Resp 16 | Wt 172.0 lb

## 2017-08-23 DIAGNOSIS — N912 Amenorrhea, unspecified: Secondary | ICD-10-CM | POA: Insufficient documentation

## 2017-08-23 DIAGNOSIS — N921 Excessive and frequent menstruation with irregular cycle: Secondary | ICD-10-CM | POA: Insufficient documentation

## 2017-08-23 DIAGNOSIS — Z0189 Encounter for other specified special examinations: Secondary | ICD-10-CM | POA: Insufficient documentation

## 2017-08-23 DIAGNOSIS — Z124 Encounter for screening for malignant neoplasm of cervix: Secondary | ICD-10-CM | POA: Insufficient documentation

## 2017-08-23 DIAGNOSIS — R946 Abnormal results of thyroid function studies: Secondary | ICD-10-CM | POA: Insufficient documentation

## 2017-08-23 DIAGNOSIS — O9270 Unspecified disorders of lactation: Secondary | ICD-10-CM

## 2017-08-23 DIAGNOSIS — Z8249 Family history of ischemic heart disease and other diseases of the circulatory system: Secondary | ICD-10-CM | POA: Insufficient documentation

## 2017-08-23 DIAGNOSIS — Z01419 Encounter for gynecological examination (general) (routine) without abnormal findings: Secondary | ICD-10-CM | POA: Insufficient documentation

## 2017-08-23 DIAGNOSIS — R7989 Other specified abnormal findings of blood chemistry: Secondary | ICD-10-CM

## 2017-08-23 DIAGNOSIS — Z0000 Encounter for general adult medical examination without abnormal findings: Secondary | ICD-10-CM

## 2017-08-23 LAB — POCT URINE PREGNANCY: PREG TEST UR: NEGATIVE

## 2017-08-23 MED ORDER — TRIAMCINOLONE ACETONIDE 0.5 % EX OINT: 1.0000 "application " | TOPICAL_OINTMENT | Freq: Two times a day (BID) | CUTANEOUS | 1 refills | Status: DC

## 2017-08-23 NOTE — Progress Notes (Signed)
Assessment & Plan:  Stephanie Young was seen today for metrorrhagia and gynecologic exam.  Diagnoses and all orders for this visit:  Encounter for Papanicolaou smear for cervical cancer screening -     Cytology - PAP  Routine adult health maintenance -     HIV antibody (with reflex)  Amenorrhea -     POCT urine pregnancy  Lactation problem -     Prolactin  Abnormal TSH -     Thyroid Panel With TSH   Patient has been counseled on age-appropriate routine health concerns for screening and prevention. These are reviewed and up-to-date. Referrals have been placed accordingly. Immunizations are up-to-date or declined.    Subjective:   Chief Complaint  Patient presents with  . Metrorrhagia  . Gynecologic Exam   HPI Stephanie Young 27 y.o. female presents to office today for routine PAP. She endorses spontaneous lactation of her right breast. This has been ongoing for quite some time now. She has a previous history of abnormal TSH and endorses Thyroid disorder in her maternal cousin. She currently denies any hypo or hyperthyroid symptoms.   Amenorrhea Patient complains of amenorrhea. Currently periods are occurring every several weeks.   Bleeding is moderate.  Periods were not regular in the past occurring every several weeks. Patient has no relevant history of abnormal sexual development. Is there a chance of pregnancy? yes  HCG? yes, negative. Factors that may be contributory to menstrual abnormalities include NONE. Previous treatments for menstrual abnormalities include NONE.     Review of Systems  Constitutional: Negative for fever, malaise/fatigue and weight loss.  HENT: Negative.  Negative for nosebleeds.   Eyes: Negative.  Negative for blurred vision, double vision and photophobia.  Respiratory: Negative.  Negative for cough and shortness of breath.   Cardiovascular: Negative.  Negative for chest pain, palpitations and leg swelling.  Gastrointestinal: Negative.  Negative for  heartburn, nausea and vomiting.  Musculoskeletal: Negative.  Negative for myalgias.  Neurological: Negative.  Negative for dizziness, focal weakness, seizures and headaches.  Psychiatric/Behavioral: Negative.  Negative for suicidal ideas.    No past medical history on file.  No past surgical history on file.  Family History  Problem Relation Age of Onset  . Breast cancer Mother   . Hypertension Mother   . Hypertension Brother   . Diabetes Maternal Uncle   . Diabetes Other     Social History Reviewed with no changes to be made today.   No outpatient medications prior to visit.   No facility-administered medications prior to visit.     No Known Allergies     Objective:    BP 117/82 (BP Location: Left Arm, Patient Position: Sitting, Cuff Size: Normal)   Pulse 88   Temp 98.3 F (36.8 C) (Oral)   Resp 16   Wt 172 lb (78 kg)   SpO2 99%   BMI 29.52 kg/m  Wt Readings from Last 3 Encounters:  08/23/17 172 lb (78 kg)  05/24/17 185 lb (83.9 kg)  03/25/17 194 lb (88 kg)    Physical Exam  Constitutional: She is oriented to person, place, and time. She appears well-developed and well-nourished. She is cooperative.  HENT:  Head: Normocephalic and atraumatic.  Eyes: EOM are normal.  Cardiovascular: Normal rate, regular rhythm and normal heart sounds. Exam reveals no gallop and no friction rub.  No murmur heard. Pulmonary/Chest: Effort normal and breath sounds normal. No tachypnea. No respiratory distress. She has no decreased breath sounds. She has no wheezes. She  has no rhonchi. She has no rales. She exhibits no tenderness. Right breast exhibits nipple discharge. Right breast exhibits no inverted nipple, no mass, no skin change and no tenderness. Left breast exhibits no inverted nipple, no mass, no nipple discharge, no skin change and no tenderness. No breast swelling, tenderness or bleeding. Breasts are symmetrical.  Abdominal: Soft. Bowel sounds are normal. Hernia confirmed  negative in the right inguinal area and confirmed negative in the left inguinal area.  Genitourinary: Rectum normal, vagina normal and uterus normal. Rectal exam shows no external hemorrhoid. No labial fusion. There is no rash, tenderness, lesion or injury on the right labia. There is no rash, tenderness, lesion or injury on the left labia. Uterus is not deviated and not enlarged. Cervix exhibits no motion tenderness and no friability. Right adnexum displays no mass, no tenderness and no fullness. Left adnexum displays no mass, no tenderness and no fullness. No erythema, tenderness or bleeding in the vagina. No foreign body in the vagina. No signs of injury around the vagina. No vaginal discharge found.  Lymphadenopathy: No inguinal adenopathy noted on the right or left side.       Right: No inguinal adenopathy present.       Left: No inguinal adenopathy present.  Neurological: She is alert and oriented to person, place, and time. Coordination normal.  Skin: Skin is warm and dry.  Psychiatric: She has a normal mood and affect. Her behavior is normal. Judgment and thought content normal.  Nursing note and vitals reviewed.        Patient has been counseled extensively about nutrition and exercise as well as the importance of adherence with medications and regular follow-up. The patient was given clear instructions to go to ER or return to medical center if symptoms don't improve, worsen or new problems develop. The patient verbalized understanding.   Follow-up: No follow-ups on file.   Claiborne RiggZelda W Giannie Soliday, FNP-BC [facility] [place] , [place] [phone]   08/23/2017, 2:17 PM

## 2017-08-23 NOTE — Patient Instructions (Signed)
Pap Test Why am I having this test? A pap test is sometimes called a pap smear. It is a screening test that is used to check for signs of cancer of the vagina, cervix, and uterus. The test can also identify the presence of infection or precancerous changes. Your health care provider will likely recommend you have this test done on a regular basis. This test may be done:  Every 3 years, starting at age 26.  Every 5 years, in combination with testing for the presence of human papillomavirus (HPV).  More or less often depending on other medical conditions.  What kind of sample is taken? Using a small cotton swab, plastic spatula, or brush, your health care provider will collect a sample of cells from the surface of your cervix. Your cervix is the opening to your uterus, also called a womb. Secretions from the cervix and vagina may also be collected. How do I prepare for this test?  Be aware of where you are in your menstrual cycle. You may be asked to reschedule the test if you are menstruating on the day of the test.  You may need to reschedule if you have a known vaginal infection on the day of the test.  You may be asked to avoid douching or taking a bath the day before or the day of the test.  Some medicines can cause abnormal test results, such as digitalis and tetracycline. Talk with your health care provider before your test if you take one of these medicines. What do the results mean? Abnormal test results may indicate a number of health conditions. These may include:  Cancer. Although pap test results cannot be used to diagnose cancer of the cervix, vagina, or uterus, they may suggest the possibility of cancer. Further tests would be required to determine if cancer is present.  Sexually transmitted disease.  Fungal infection.  Parasite infection.  Herpes infection.  A condition causing or contributing to infertility.  It is your responsibility to obtain your test results.  Ask the lab or department performing the test when and how you will get your results. Contact your health care provider to discuss any questions you have about your results. Talk with your health care provider to discuss your results, treatment options, and if necessary, the need for more tests. Talk with your health care provider if you have any questions about your results. This information is not intended to replace advice given to you by your health care provider. Make sure you discuss any questions you have with your health care provider. Document Released: 05/15/2002 Document Revised: 10/29/2015 Document Reviewed: 07/16/2013 Elsevier Interactive Patient Education  2018 Elsevier Inc.  Preventing Cervical Cancer Cervical cancer is cancer that grows on the cervix. The cervix is at the bottom of the uterus. It connects the uterus to the vagina. The uterus is where a baby develops during pregnancy. Cancer occurs when cells become abnormal and start to grow out of control. Cervical cancer grows slowly and may not cause any symptoms at first. Over time, the cancer can grow deep into the cervix tissue and spread to other areas. If it is found early, cervical cancer can be treated effectively. You can also take steps to prevent this type of cancer. Most cases of cervical cancer are caused by an STI (sexually transmitted infection) called human papillomavirus (HPV). One way to reduce your risk of cervical cancer is to avoid infection with the HPV virus. You can do this by practicing safe   sex and by getting the HPV vaccine. Getting regular Pap tests is also important because this can help identify changes in cells that could lead to cancer. Your chances of getting this disease can also be reduced by making certain lifestyle changes. How can I protect myself from cervical cancer? Preventing HPV infection  Ask your health care provider about getting the HPV vaccine. If you are 26 years old or younger, you may  need to get this vaccine, which is given in three doses over 6 months. This vaccine protects against the types of HPV that could cause cancer.  Limit the number of people you have sex with. Also avoid having sex with people who have had many sex partners.  Use a latex condom during sex. Getting Pap tests  Get Pap tests regularly, starting at age 26. Talk with your health care provider about how often you need these tests. ? Most women who are 21?26 years of age should have a Pap test every 3 years. ? Most women who are 30?26 years of age should have a Pap test in combination with an HPV test every 5 years. ? Women with a higher risk of cervical cancer, such as those with a weakened immune system or those who have been exposed to the drug diethylstilbestrol (DES), may need more frequent testing. Making other lifestyle changes  Do not use any products that contain nicotine or tobacco, such as cigarettes and e-cigarettes. If you need help quitting, ask your health care provider.  Eat at least 5 servings of fruits and vegetables every day.  Lose weight if you are overweight. Why are these changes important?  These changes and screening tests are designed to address the factors that are known to increase the risk of cervical cancer. Taking these steps is the best way to reduce your risk.  Having regular Pap tests will help identify changes in cells that could lead to cancer. Steps can then be taken to prevent cancer from developing.  These changes will also help find cervical cancer early. This type of cancer can be treated effectively if it is found early. It can be more dangerous and difficult to treat if cancer has grown deep into your cervix or has spread.  In addition to making you less likely to get cervical cancer, these changes will also provide other health benefits, such as the following: ? Practicing safe sex is important for preventing STIs and unplanned pregnancies. ? Avoiding  tobacco can reduce your risk for other cancers and health issues. ? Eating a healthy diet and maintaining a healthy weight are good for your overall health. What can happen if changes are not made? In the early stages, cervical cancer might not have any symptoms. It can take many years for the cancer to grow and get deep into the cervix tissue. This may be happening without you knowing about it. If you develop any symptoms, such as pelvic pain or unusual discharge or bleeding from your vagina, you should see your health care provider right away. If cervical cancer is not found early, you might need treatments such as radiation, chemotherapy, or surgery. In some cases, surgery may mean that you will not be able to get pregnant or carry a pregnancy to term. Where to find support: Talk with your health care provider, school nurse, or local health department for guidance about screening and vaccination. Some children and teens may be able to get the HPV vaccine free of charge through the U.[place]. government's   Vaccines for Children (VFC) program. Other places that provide vaccinations include:  Public health clinics. Check with your local health department.  Federally Qualified Health Centers, where you would pay only what you can afford. To find one near you, check this website: www.fqhc.org/find-an-fqhc/  Rural Health Clinics. These are part of a program for Medicare and Medicaid patients who live in rural areas.  The National Breast and Cervical Cancer Early Detection Program also provides breast and cervical cancer screenings and diagnostic services to low-income, uninsured, and underinsured women. Cervical cancer can be passed down through families. Talk with your health care provider or genetic counselor to learn more about genetic testing for cancer. Where to find more information: Learn more about cervical cancer from:  American College of Gynecology:  www.acog.org/Patients/FAQs/Cervical-Cancer  American Cancer Society: www.cancer.org/cancer/cervicalcancer/  U.S. Centers for Disease Control and Prevention: www.cdc.gov/cancer/cervical/  Summary  Talk with your health care provider about getting the HPV vaccine.  Be sure to get regular Pap tests as recommended by your health care provider.  See your health care provider right away if you have any pelvic pain or unusual discharge or bleeding from your vagina. This information is not intended to replace advice given to you by your health care provider. Make sure you discuss any questions you have with your health care provider. Document Released: 03/09/2015 Document Revised: 10/21/2015 Document Reviewed: 10/21/2015 Elsevier Interactive Patient Education  2018 Elsevier Inc.  

## 2017-08-24 LAB — CYTOLOGY - PAP
Bacterial vaginitis: NEGATIVE
CANDIDA VAGINITIS: NEGATIVE
CHLAMYDIA, DNA PROBE: NEGATIVE
DIAGNOSIS: NEGATIVE
NEISSERIA GONORRHEA: NEGATIVE
TRICH (WINDOWPATH): NEGATIVE

## 2017-08-24 LAB — THYROID PANEL WITH TSH
FREE THYROXINE INDEX: 2.2 (ref 1.2–4.9)
T3 Uptake Ratio: 26 % (ref 24–39)
T4, Total: 8.3 ug/dL (ref 4.5–12.0)
TSH: 0.514 u[IU]/mL (ref 0.450–4.500)

## 2017-08-24 LAB — PROLACTIN: PROLACTIN: 22.1 ng/mL (ref 4.8–23.3)

## 2017-08-24 LAB — HIV ANTIBODY (ROUTINE TESTING W REFLEX): HIV SCREEN 4TH GENERATION: NONREACTIVE

## 2017-08-25 ENCOUNTER — Telehealth: Payer: Self-pay

## 2017-08-25 NOTE — Telephone Encounter (Signed)
CMA attempt to call patient to inform on lab results.  No answer and left a VM for patient to call back.  If patient call back, please inform:   Thyroid levels are normal as well as prolactin level. HIV test negative PAP smear is normal. Next PAP is 2022

## 2017-08-25 NOTE — Telephone Encounter (Signed)
-----   Message from Claiborne RiggZelda W Fleming, NP sent at 08/24/2017  7:03 PM EDT ----- PAP smear is normal. Next PAP is 2022

## 2017-08-25 NOTE — Telephone Encounter (Signed)
-----   Message from Claiborne RiggZelda W Fleming, NP sent at 08/24/2017  9:19 AM EDT ----- Thyroid levels are normal as well as prolactin level. HIV test negative

## 2017-08-29 ENCOUNTER — Telehealth: Payer: Self-pay | Admitting: *Deleted

## 2017-08-29 NOTE — Telephone Encounter (Signed)
Patient verified DOB Patient is aware of labs being normal including prolactin and thyroid. Patient will complete another PAP in 2022. No further questions.

## 2017-09-13 ENCOUNTER — Ambulatory Visit (INDEPENDENT_AMBULATORY_CARE_PROVIDER_SITE_OTHER): Payer: Self-pay | Admitting: Gastroenterology

## 2017-09-13 ENCOUNTER — Encounter: Payer: Self-pay | Admitting: Internal Medicine

## 2017-09-13 ENCOUNTER — Encounter: Payer: Self-pay | Admitting: Gastroenterology

## 2017-09-13 VITALS — BP 137/83 | HR 78 | Temp 97.0°F | Ht 65.0 in | Wt 165.0 lb

## 2017-09-13 DIAGNOSIS — R109 Unspecified abdominal pain: Secondary | ICD-10-CM

## 2017-09-13 DIAGNOSIS — K594 Anal spasm: Secondary | ICD-10-CM

## 2017-09-13 MED ORDER — HYDROCORTISONE 2.5 % RE CREA: 1.0000 "application " | TOPICAL_CREAM | Freq: Two times a day (BID) | RECTAL | 1 refills | Status: DC

## 2017-09-13 MED ORDER — DICYCLOMINE HCL 10 MG PO CAPS: 10.0000 mg | ORAL_CAPSULE | Freq: Three times a day (TID) | ORAL | 3 refills | Status: DC

## 2017-09-13 NOTE — Progress Notes (Signed)
Primary Care Physician:  Claiborne RiggFleming, [name], NP Primary Gastroenterologist:  Dr. Jena Gaussourk   Chief Complaint  Patient presents with  . Abdominal Pain    improved. 'occas cramping eposides then will have a loose stool"  . Rectal Pain    Will occas get sharp rectal pain, comes/goes x past few months    HPI:   Stephanie SchneidersJuliana Young is a 27 y.o. female presenting today at the request of Bertram DenverZelda Fleming, NP, due to abdominal pain and loose stool.   H.pylori breath test positive in Jan 2019. Treated with antibiotics. Epigastric pain resolved. Will sometimes have rare reflux but not routinely.    Will have intermittent cramping and will have loose stool. Will have sharp rectal pain every 2 weeks that lasts a few times throughout the day. No recent rectal bleeding. Scant blood in past but not frequent or repeated episodes. Rectal pain started 3 months ago. BM usually every day. Will have a few flares of loose stool occasionally. No identifying triggers.   Past Medical History:  Diagnosis Date  . H. pylori infection   . Kidney stones     Past Surgical History:  Procedure Laterality Date  . none      Current Outpatient Medications  Medication Sig Dispense Refill  . acetaminophen (TYLENOL) 500 MG tablet Take 500 mg by mouth every 6 (six) hours as needed.     No current facility-administered medications for this visit.     Allergies as of 09/13/2017  . (No Known Allergies)    Family History  Problem Relation Age of Onset  . Breast cancer Mother [id number]  . Hypertension Mother   . Hypertension Brother   . Diabetes Maternal Uncle   . Diabetes Other   . Colon cancer Neg Hx   . Colon polyps Neg Hx     Social History   Socioeconomic History  . Marital status: Single    Spouse name: Not on file  . Number of children: Not on file  . Years of education: Not on file  . Highest education level: Not on file  Occupational History  . Not on file  Social Needs  . Financial resource strain:  Not on file  . Food insecurity:    Worry: Not on file    Inability: Not on file  . Transportation needs:    Medical: Not on file    Non-medical: Not on file  Tobacco Use  . Smoking status: Former Games developermoker  . Smokeless tobacco: Never Used  Substance and Sexual Activity  . Alcohol use: Yes    Comment: socially   . Drug use: Yes    Types: Marijuana    Comment: socially  . Sexual activity: Yes    Partners: Male  Lifestyle  . Physical activity:    Days per week: Not on file    Minutes per session: Not on file  . Stress: Not on file  Relationships  . Social connections:    Talks on phone: Not on file    Gets together: Not on file    Attends religious service: Not on file    Active member of club or organization: Not on file    Attends meetings of clubs or organizations: Not on file    Relationship status: Not on file  . Intimate partner violence:    Fear of current or ex partner: Not on file    Emotionally abused: Not on file    Physically abused: Not on file  Forced sexual activity: Not on file  Other Topics Concern  . Not on file  Social History Narrative  . Not on file    Review of Systems: Gen: Denies any fever, chills, fatigue, weight loss, lack of appetite.  CV: Denies chest pain, heart palpitations, peripheral edema, syncope.  Resp: Denies shortness of breath at rest or with exertion. Denies wheezing or cough.  GI: see HPI  GU : Denies urinary burning, urinary frequency, urinary hesitancy MS: Denies joint pain, muscle weakness, cramps, or limitation of movement.  Derm: Denies rash, itching, dry skin Psych: Denies depression, anxiety, memory loss, and confusion Heme: Denies bruising, bleeding, and enlarged lymph nodes.  Physical Exam: BP 137/83   Pulse 78   Temp (!) 97 F (36.1 C) (Oral)   Ht 5\' 5"  (1.651 m)   Wt 165 lb (74.8 kg)   LMP 09/13/2017   BMI 27.46 kg/m  General:   Alert and oriented. Pleasant and cooperative. Well-nourished and well-developed.   Head:  Normocephalic and atraumatic. Eyes:  Without icterus, sclera clear and conjunctiva pink.  Ears:  Normal auditory acuity. Nose:  No deformity, discharge,  or lesions. Mouth:  No deformity or lesions, oral mucosa pink.  Lungs:  Clear to auscultation bilaterally. No wheezes, rales, or rhonchi. No distress.  Heart:  S1, S2 present without murmurs appreciated.  Abdomen:  +BS, soft, non-tender and non-distended. No HSM noted. No guarding or rebound. No masses appreciated.  Rectal:  Deferred  Msk:  Symmetrical without gross deformities. Normal posture. Extremities:  Without clubbing or edema. Neurologic:  Alert and  oriented x4;  grossly normal neurologically. Psych:  Alert and cooperative. Normal mood and affect.

## 2017-09-13 NOTE — Patient Instructions (Signed)
It was good to meet you!  For the rectal discomfort: you can use the anusol cream as needed for episodes. I sent this to the pharmacy.  For abdominal cramping and intermittent loose stool, you can take Bentyl as needed up to 4 times a day. Monitor for dry mouth, constipation, dizziness.  I will see you in 4-6 months!  It was a pleasure to see you today. I strive to create trusting relationships with patients to provide genuine, compassionate, and quality care. I value your feedback. If you receive a survey regarding your visit,  I greatly appreciate you taking time to fill this out.   Gelene MinkAnna W. Lowell Makara, PhD, ANP-BC [facility]

## 2017-09-16 NOTE — Assessment & Plan Note (Signed)
Intermittent rectal discomfort unprovoked. Trial of anusol prn. May need colonoscopy if continues. Return in 4-6 months.

## 2017-09-16 NOTE — Assessment & Plan Note (Signed)
27 year old delightful female with history of intermittent loose stool and cramping but no alarm features. Purposefully working on weight loss. Will trial Bentyl prn. Return in 4-6 months.

## 2017-09-20 NOTE — Progress Notes (Signed)
cc'ed to pcp °

## 2017-09-27 ENCOUNTER — Encounter: Payer: Self-pay | Admitting: Nurse Practitioner

## 2017-09-27 ENCOUNTER — Ambulatory Visit: Payer: Self-pay | Attending: Nurse Practitioner | Admitting: Nurse Practitioner

## 2017-09-27 VITALS — BP 128/81 | HR 77 | Temp 98.9°F | Ht 64.0 in | Wt 164.2 lb

## 2017-09-27 DIAGNOSIS — Z0000 Encounter for general adult medical examination without abnormal findings: Secondary | ICD-10-CM

## 2017-09-27 DIAGNOSIS — M25511 Pain in right shoulder: Secondary | ICD-10-CM | POA: Insufficient documentation

## 2017-09-27 DIAGNOSIS — Z87442 Personal history of urinary calculi: Secondary | ICD-10-CM | POA: Insufficient documentation

## 2017-09-27 DIAGNOSIS — Z803 Family history of malignant neoplasm of breast: Secondary | ICD-10-CM | POA: Insufficient documentation

## 2017-09-27 DIAGNOSIS — Z0001 Encounter for general adult medical examination with abnormal findings: Secondary | ICD-10-CM | POA: Insufficient documentation

## 2017-09-27 DIAGNOSIS — Z8249 Family history of ischemic heart disease and other diseases of the circulatory system: Secondary | ICD-10-CM | POA: Insufficient documentation

## 2017-09-27 MED ORDER — TIZANIDINE HCL 4 MG PO TABS
4.0000 mg | ORAL_TABLET | Freq: Four times a day (QID) | ORAL | 1 refills | Status: DC | PRN
Start: 2017-09-27 — End: 2017-11-21

## 2017-09-27 NOTE — Progress Notes (Signed)
Assessment & Plan:  Stephanie Young was seen today for annual exam.  Diagnoses and all orders for this visit:  Annual physical exam  Acute pain of right shoulder -     tiZANidine (Stephanie Young) 4 MG tablet; Take 1 tablet (4 mg total) by mouth every 6 (six) hours as needed for muscle spasms.    Patient has been counseled on age-appropriate routine health concerns for screening and prevention. These are reviewed and up-to-date. Referrals have been placed accordingly. Immunizations are up-to-date or declined.    Subjective:   Chief Complaint  Patient presents with  . Annual Exam    Pt. stated is here for a physical and patient have a right shoulder pain, and she fill her lymph nodes are swollen.    HPI Stephanie Young 27 y.o. female presents to office today for annual physical exam. She has complaints of right shoulder pain today.   Shoulder Pain Patient complaints of right shoulder pain. The pain is described as aching, sharp and tight band.  The onset of the pain was several months ago.  The pain occurs continuously and lasts all day.  Location is neck, trapezius. No history of dislocation. Symptoms are aggravated by all activities. Symptoms are diminished by massaging the area. Limited activities include: no limitations. moderate stiffness, no weakness, no swelling, no crepitus noted is reported. Patient has not missed work.   Review of Systems  Constitutional: Negative.  Negative for chills, fever, malaise/fatigue and weight loss.  HENT: Negative.  Negative for congestion, hearing loss, sinus pain and sore throat.   Eyes: Negative.  Negative for blurred vision, double vision, photophobia and pain.  Respiratory: Negative.  Negative for cough, sputum production, shortness of breath and wheezing.   Cardiovascular: Negative.  Negative for chest pain and leg swelling.  Gastrointestinal: Negative.  Negative for abdominal pain, constipation, diarrhea, heartburn, nausea and vomiting.    Genitourinary: Negative.   Musculoskeletal: Positive for myalgias. Negative for joint pain.  Skin: Negative.  Negative for rash.  Neurological: Negative.  Negative for dizziness, tremors, speech change, focal weakness, seizures and headaches.  Endo/Heme/Allergies: Negative.  Negative for environmental allergies.  Psychiatric/Behavioral: Negative.  Negative for depression and suicidal ideas. The patient is not nervous/anxious and does not have insomnia.     Past Medical History:  Diagnosis Date  . Stephanie Young   . Stephanie Young     Past Surgical History:  Procedure Laterality Date  . none      Family History  Problem Relation Age of Onset  . Breast cancer Mother 6  . Hypertension Mother   . Hypertension Brother   . Diabetes Maternal Uncle   . Diabetes Other   . Colon cancer Neg Hx   . Colon polyps Neg Hx     Social History Reviewed with no changes to be made today.   Outpatient Medications Prior to Visit  Medication Sig Dispense Refill  . acetaminophen (TYLENOL) 500 MG tablet Take 500 mg by mouth every 6 (six) hours as needed.    . dicyclomine (BENTYL) 10 MG capsule Take 1 capsule (10 mg total) by mouth 4 (four) times daily -  before meals and at bedtime. As needed for abdominal cramping and loose stool 120 capsule 3  . hydrocortisone (ANUSOL-HC) 2.5 % rectal cream Place 1 application rectally 2 (two) times daily. As needed for rectal discomfort 30 g 1   No facility-administered medications prior to visit.     No Known Allergies     Objective:  BP 128/81 (BP Location: Right Arm, Patient Position: Sitting, Cuff Size: Normal)   Pulse 77   Temp 98.9 F (37.2 C) (Oral)   Ht 5\' 4"  (1.626 m)   Wt 164 lb 3.2 oz (74.5 kg)   LMP 09/13/2017   SpO2 100%   BMI 28.18 kg/m  Wt Readings from Last 3 Encounters:  09/27/17 164 lb 3.2 oz (74.5 kg)  09/13/17 165 lb (74.8 kg)  08/23/17 172 lb (78 kg)    Physical Exam  Constitutional: She is oriented to person,  place, and time. She appears well-developed and well-nourished.  HENT:  Head: Normocephalic and atraumatic.  Right Ear: External ear normal.  Left Ear: External ear normal.  Nose: Nose normal.  Mouth/Throat: Oropharynx is clear and moist. No oropharyngeal exudate.  Eyes: Pupils are equal, round, and reactive to light. Conjunctivae and EOM are normal. Right eye exhibits no discharge. No scleral icterus.  Neck: Normal range of motion. Neck supple. No tracheal deviation present. No thyromegaly present.  Cardiovascular: Normal rate, regular rhythm, normal heart sounds and intact distal pulses. Exam reveals no friction rub.  No murmur heard. Pulmonary/Chest: Effort normal and breath sounds normal. No accessory muscle usage. No respiratory distress. She has no decreased breath sounds. She has no wheezes. She has no rhonchi. She has no rales. She exhibits no tenderness. Right breast exhibits no inverted nipple, no mass, no nipple discharge, no skin change and no tenderness. Left breast exhibits no inverted nipple, no mass, no nipple discharge, no skin change and no tenderness. Breasts are symmetrical.  Abdominal: Soft. Bowel sounds are normal. She exhibits no distension and no mass. There is no tenderness. There is no rebound and no guarding.  Musculoskeletal: Normal range of motion. She exhibits no edema, tenderness or deformity.       Arms: Lymphadenopathy:    She has no cervical adenopathy.  Neurological: She is alert and oriented to person, place, and time. She has normal reflexes. No cranial nerve deficit. Coordination normal.  Skin: Skin is warm and dry. No erythema.  Psychiatric: She has a normal mood and affect. Her speech is normal and behavior is normal. Judgment and thought content normal.      Patient has been counseled extensively about nutrition and exercise as well as the importance of adherence with medications and regular follow-up. The patient was given clear instructions to go to ER  or return to medical center if symptoms don't improve, worsen or new problems develop. The patient verbalized understanding.   Follow-up: Return if symptoms worsen or fail to improve.   Claiborne RiggZelda W Fleming, FNP-BC [facility] [place], [place] [phone]   09/27/2017, 9:32 PM

## 2017-09-27 NOTE — Patient Instructions (Signed)
Benign Positional Vertigo Vertigo is the feeling that you or your surroundings are moving when they are not. Benign positional vertigo is the most common form of vertigo. The cause of this condition is not serious (is benign). This condition is triggered by certain movements and positions (is positional). This condition can be dangerous if it occurs while you are doing something that could endanger you or others, such as driving. What are the causes? In many cases, the cause of this condition is not known. It may be caused by a disturbance in an area of the inner ear that helps your brain to sense movement and balance. This disturbance can be caused by a viral infection (labyrinthitis), head injury, or repetitive motion. What increases the risk? This condition is more likely to develop in:  Women.  People who are 50 years of age or older.  What are the signs or symptoms? Symptoms of this condition usually happen when you move your head or your eyes in different directions. Symptoms may start suddenly, and they usually last for less than a minute. Symptoms may include:  Loss of balance and falling.  Feeling like you are spinning or moving.  Feeling like your surroundings are spinning or moving.  Nausea and vomiting.  Blurred vision.  Dizziness.  Involuntary eye movement (nystagmus).  Symptoms can be mild and cause only slight annoyance, or they can be severe and interfere with daily life. Episodes of benign positional vertigo may return (recur) over time, and they may be triggered by certain movements. Symptoms may improve over time. How is this diagnosed? This condition is usually diagnosed by medical history and a physical exam of the head, neck, and ears. You may be referred to a health care provider who specializes in ear, nose, and throat (ENT) problems (otolaryngologist) or a provider who specializes in disorders of the nervous system (neurologist). You may have additional testing,  including:  MRI.  A CT scan.  Eye movement tests. Your health care provider may ask you to change positions quickly while he or she watches you for symptoms of benign positional vertigo, such as nystagmus. Eye movement may be tested with an electronystagmogram (ENG), caloric stimulation, the Dix-Hallpike test, or the roll test.  An electroencephalogram (EEG). This records electrical activity in your brain.  Hearing tests.  How is this treated? Usually, your health care provider will treat this by moving your head in specific positions to adjust your inner ear back to normal. Surgery may be needed in severe cases, but this is rare. In some cases, benign positional vertigo may resolve on its own in 2-4 weeks. Follow these instructions at home: Safety  Move slowly.Avoid sudden body or head movements.  Avoid driving.  Avoid operating heavy machinery.  Avoid doing any tasks that would be dangerous to you or others if a vertigo episode would occur.  If you have trouble walking or keeping your balance, try using a cane for stability. If you feel dizzy or unstable, sit down right away.  Return to your normal activities as told by your health care provider. Ask your health care provider what activities are safe for you. General instructions  Take over-the-counter and prescription medicines only as told by your health care provider.  Avoid certain positions or movements as told by your health care provider.  Drink enough fluid to keep your urine clear or pale yellow.  Keep all follow-up visits as told by your health care provider. This is important. Contact a health care   provider if:  You have a fever.  Your condition gets worse or you develop new symptoms.  Your family or friends notice any behavioral changes.  Your nausea or vomiting gets worse.  You have numbness or a "pins and needles" sensation. Get help right away if:  You have difficulty speaking or moving.  You are  always dizzy.  You faint.  You develop severe headaches.  You have weakness in your legs or arms.  You have changes in your hearing or vision.  You develop a stiff neck.  You develop sensitivity to light. This information is not intended to replace advice given to you by your health care provider. Make sure you discuss any questions you have with your health care provider. Document Released: 11/30/2005 Document Revised: 07/31/2015 Document Reviewed: 06/17/2014 Elsevier Interactive Patient Education  2018 [employer]. Dizziness Dizziness is a common problem. It makes you feel unsteady or light-headed. You may feel like you are about to pass out (faint). Dizziness can lead to getting hurt if you stumble or fall. Dizziness can be caused by many things, including:  Medicines.  Not having enough water in your body (dehydration).  Illness.  Follow these instructions at home: Eating and drinking  Drink enough fluid to keep your pee (urine) clear or pale yellow. This helps to keep you from getting dehydrated. Try to drink more clear fluids, such as water.  Do not drink alcohol.  Limit how much caffeine you drink or eat, if your doctor tells you to do that.  Limit how much salt (sodium) you drink or eat, if your doctor tells you to do that. Activity  Avoid making quick movements. ? When you stand up from sitting in a chair, steady yourself until you feel okay. ? In the morning, first sit up on the side of the bed. When you feel okay, stand slowly while you hold onto something. Do this until you know that your balance is fine.  If you need to stand in one place for a long time, move your legs often. Tighten and relax the muscles in your legs while you are standing.  Do not drive or use heavy machinery if you feel dizzy.  Avoid bending down if you feel dizzy. Place items in your home so you can reach them easily without leaning over. Lifestyle  Do not use any products that  contain nicotine or tobacco, such as cigarettes and e-cigarettes. If you need help quitting, ask your doctor.  Try to lower your stress level. You can do this by using methods such as yoga or meditation. Talk with your doctor if you need help. General instructions  Watch your dizziness for any changes.  Take over-the-counter and prescription medicines only as told by your doctor. Talk with your doctor if you think that you are dizzy because of a medicine that you are taking.  Tell a friend or a family member that you are feeling dizzy. If he or she notices any changes in your behavior, have this person call your doctor.  Keep all follow-up visits as told by your doctor. This is important. Contact a doctor if:  Your dizziness does not go away.  Your dizziness or light-headedness gets worse.  You feel sick to your stomach (nauseous).  You have trouble hearing.  You have new symptoms.  You are unsteady on your feet.  You feel like the room is spinning. Get help right away if:  You throw up (vomit) or have watery poop (diarrhea),  and you cannot eat or drink anything.  You have trouble: ? Talking. ? Walking. ? Swallowing. ? Using your arms, hands, or legs.  You feel generally weak.  You are not thinking clearly, or you have trouble forming sentences. A friend or family member may notice this.  You have: ? Chest pain. ? Pain in your belly (abdomen). ? Shortness of breath. ? Sweating.  Your vision changes.  You are bleeding.  You have a very bad headache.  You have neck pain or a stiff neck.  You have a fever. These symptoms may be an emergency. Do not wait to see if the symptoms will go away. Get medical help right away. Call your local emergency services (911 in the [place] Do not drive yourself to the hospital. Summary  Dizziness makes you feel unsteady or light-headed. You may feel like you are about to pass out (faint).  Drink enough fluid to keep your pee  (urine) clear or pale yellow. Do not drink alcohol.  Avoid making quick movements if you feel dizzy.  Watch your dizziness for any changes. This information is not intended to replace advice given to you by your health care provider. Make sure you discuss any questions you have with your health care provider. Document Released: 02/11/2011 Document Revised: 03/11/2016 Document Reviewed: 03/11/2016 Elsevier Interactive Patient Education  2017 [employer]  Muscle Pain, Adult Muscle pain (myalgia) may be mild or severe. In most cases, the pain lasts only a short time and it goes away without treatment. It is normal to feel some muscle pain after starting a workout program. Muscles that have not been used often will be sore at first. Muscle pain may also be caused by many other things, including:  Overuse or muscle strain, especially if you are not in shape. This is the most common cause of muscle pain.  Injury.  Bruises.  Viruses, such as the flu.  Infectious diseases.  A chronic condition that causes muscle tenderness, fatigue, and headache (fibromyalgia).  A condition, such as lupus, in which the body's disease-fighting system attacks other organs in the body (autoimmune or rheumatologic diseases).  Certain drugs, including ACE inhibitors and statins.  To diagnose the cause of your muscle pain, your health care provider will do a physical exam and ask questions about the pain and when it began. If you have not had muscle pain for very long, your health care provider may want to wait before doing much testing. If your muscle pain has lasted a long time, your health care provider may want to run tests right away. In some cases, this may include tests to rule out certain conditions or illnesses. Treatment for muscle pain depends on the cause. Home care is often enough to relieve muscle pain. Your health care provider may also prescribe anti-inflammatory medicine. Follow these  instructions at home: Activity  If overuse is causing your muscle pain: ? Slow down your activities until the pain goes away. ? Do regular, gentle exercises if you are not usually active. ? Warm up before exercising. Stretch before and after exercising. This can help lower the risk of muscle pain.  Do not continue working out if the pain is very bad. Bad pain could mean that you have injured a muscle. Managing pain and discomfort   If directed, apply ice to the sore muscle: ? Put ice in a plastic bag. ? Place a towel between your skin and the bag. ? Leave the ice on for 20 minutes,  2-3 times a day.  You may also alternate between applying ice and applying heat as told by your health care provider. To apply heat, use the heat source that your health care provider recommends, such as a moist heat pack or a heating pad. ? Place a towel between your skin and the heat source. ? Leave the heat on for 20-30 minutes. ? Remove the heat if your skin turns bright red. This is especially important if you are unable to feel pain, heat, or cold. You may have a greater risk of getting burned. Medicines  Take over-the-counter and prescription medicines only as told by your health care provider.  Do not drive or use heavy machinery while taking prescription pain medicine. Contact a health care provider if:  Your muscle pain gets worse and medicines do not help.  You have muscle pain that lasts longer than 3 days.  You have a rash or fever along with muscle pain.  You have muscle pain after a tick bite.  You have muscle pain while working out, even though you are in good physical condition.  You have redness, soreness, or swelling along with muscle pain.  You have muscle pain after starting a new medicine or changing the dose of a medicine. Get help right away if:  You have trouble breathing.  You have trouble swallowing.  You have muscle pain along with a stiff neck, fever, and  vomiting.  You have severe muscle weakness or cannot move part of your body. This information is not intended to replace advice given to you by your health care provider. Make sure you discuss any questions you have with your health care provider. Document Released: 01/14/2006 Document Revised: 09/12/2015 Document Reviewed: 07/15/2015 Elsevier Interactive Patient Education  2018 [employer].

## 2017-10-12 ENCOUNTER — Encounter (HOSPITAL_COMMUNITY): Payer: Self-pay | Admitting: Emergency Medicine

## 2017-10-12 ENCOUNTER — Ambulatory Visit (INDEPENDENT_AMBULATORY_CARE_PROVIDER_SITE_OTHER): Payer: Self-pay

## 2017-10-12 ENCOUNTER — Emergency Department (HOSPITAL_COMMUNITY): Payer: Self-pay

## 2017-10-12 ENCOUNTER — Inpatient Hospital Stay (HOSPITAL_COMMUNITY)
Admission: EM | Admit: 2017-10-12 | Discharge: 2017-11-01 | DRG: 870 | Disposition: A | Discharge status: Home / Self Care | Payer: Self-pay | Source: Ambulatory Visit | Attending: Internal Medicine | Admitting: Internal Medicine

## 2017-10-12 ENCOUNTER — Other Ambulatory Visit: Payer: Self-pay

## 2017-10-12 ENCOUNTER — Ambulatory Visit (HOSPITAL_COMMUNITY)
Admission: EM | Admit: 2017-10-12 | Discharge: 2017-10-12 | Disposition: A | Discharge status: Home / Self Care | Payer: Self-pay | Attending: Family Medicine | Admitting: Family Medicine

## 2017-10-12 DIAGNOSIS — R079 Chest pain, unspecified: Secondary | ICD-10-CM

## 2017-10-12 DIAGNOSIS — J80 Acute respiratory distress syndrome: Secondary | ICD-10-CM | POA: Diagnosis not present

## 2017-10-12 DIAGNOSIS — Z8249 Family history of ischemic heart disease and other diseases of the circulatory system: Secondary | ICD-10-CM

## 2017-10-12 DIAGNOSIS — R000 Tachycardia, unspecified: Secondary | ICD-10-CM

## 2017-10-12 DIAGNOSIS — R0602 Shortness of breath: Secondary | ICD-10-CM

## 2017-10-12 DIAGNOSIS — D72829 Elevated white blood cell count, unspecified: Secondary | ICD-10-CM

## 2017-10-12 DIAGNOSIS — R1312 Dysphagia, oropharyngeal phase: Secondary | ICD-10-CM | POA: Diagnosis not present

## 2017-10-12 DIAGNOSIS — J9600 Acute respiratory failure, unspecified whether with hypoxia or hypercapnia: Secondary | ICD-10-CM

## 2017-10-12 DIAGNOSIS — E873 Alkalosis: Secondary | ICD-10-CM | POA: Diagnosis present

## 2017-10-12 DIAGNOSIS — E871 Hypo-osmolality and hyponatremia: Secondary | ICD-10-CM

## 2017-10-12 DIAGNOSIS — R509 Fever, unspecified: Secondary | ICD-10-CM

## 2017-10-12 DIAGNOSIS — Z87891 Personal history of nicotine dependence: Secondary | ICD-10-CM

## 2017-10-12 DIAGNOSIS — Z833 Family history of diabetes mellitus: Secondary | ICD-10-CM

## 2017-10-12 DIAGNOSIS — J9601 Acute respiratory failure with hypoxia: Secondary | ICD-10-CM

## 2017-10-12 DIAGNOSIS — Z3202 Encounter for pregnancy test, result negative: Secondary | ICD-10-CM

## 2017-10-12 DIAGNOSIS — Z781 Physical restraint status: Secondary | ICD-10-CM

## 2017-10-12 DIAGNOSIS — J181 Lobar pneumonia, unspecified organism: Secondary | ICD-10-CM | POA: Diagnosis present

## 2017-10-12 DIAGNOSIS — Z978 Presence of other specified devices: Secondary | ICD-10-CM

## 2017-10-12 DIAGNOSIS — Z789 Other specified health status: Secondary | ICD-10-CM

## 2017-10-12 DIAGNOSIS — R490 Dysphonia: Secondary | ICD-10-CM | POA: Diagnosis not present

## 2017-10-12 DIAGNOSIS — D62 Acute posthemorrhagic anemia: Secondary | ICD-10-CM | POA: Diagnosis present

## 2017-10-12 DIAGNOSIS — Z87442 Personal history of urinary calculi: Secondary | ICD-10-CM

## 2017-10-12 DIAGNOSIS — J189 Pneumonia, unspecified organism: Secondary | ICD-10-CM | POA: Diagnosis present

## 2017-10-12 DIAGNOSIS — A419 Sepsis, unspecified organism: Principal | ICD-10-CM | POA: Diagnosis present

## 2017-10-12 DIAGNOSIS — E870 Hyperosmolality and hypernatremia: Secondary | ICD-10-CM | POA: Diagnosis not present

## 2017-10-12 DIAGNOSIS — E876 Hypokalemia: Secondary | ICD-10-CM | POA: Diagnosis not present

## 2017-10-12 DIAGNOSIS — Z803 Family history of malignant neoplasm of breast: Secondary | ICD-10-CM

## 2017-10-12 DIAGNOSIS — R0682 Tachypnea, not elsewhere classified: Secondary | ICD-10-CM

## 2017-10-12 DIAGNOSIS — R451 Restlessness and agitation: Secondary | ICD-10-CM | POA: Diagnosis not present

## 2017-10-12 DIAGNOSIS — E8770 Fluid overload, unspecified: Secondary | ICD-10-CM | POA: Diagnosis not present

## 2017-10-12 LAB — CBC WITH DIFFERENTIAL/PLATELET
Abs Immature Granulocytes: 0.1 10*3/uL (ref 0.0–0.1)
Basophils Absolute: 0 10*3/uL (ref 0.0–0.1)
Basophils Relative: 0 %
EOS ABS: 0 10*3/uL (ref 0.0–0.7)
EOS PCT: 0 %
HCT: 35.6 % — ABNORMAL LOW (ref 36.0–46.0)
Hemoglobin: 11.4 g/dL — ABNORMAL LOW (ref 12.0–15.0)
Immature Granulocytes: 1 %
Lymphocytes Relative: 3 %
Lymphs Abs: 0.5 10*3/uL — ABNORMAL LOW (ref 0.7–4.0)
MCH: 27.7 pg (ref 26.0–34.0)
MCHC: 32 g/dL (ref 30.0–36.0)
MCV: 86.6 fL (ref 78.0–100.0)
MONO ABS: 0.2 10*3/uL (ref 0.1–1.0)
MONOS PCT: 1 %
Neutro Abs: 16 10*3/uL — ABNORMAL HIGH (ref 1.7–7.7)
Neutrophils Relative %: 95 %
PLATELETS: 398 10*3/uL (ref 150–400)
RBC: 4.11 MIL/uL (ref 3.87–5.11)
RDW: 13.2 % (ref 11.5–15.5)
WBC: 16.8 10*3/uL — ABNORMAL HIGH (ref 4.0–10.5)

## 2017-10-12 LAB — BASIC METABOLIC PANEL
Anion gap: 13 (ref 5–15)
BUN: 6 mg/dL (ref 6–20)
CALCIUM: 9.3 mg/dL (ref 8.9–10.3)
CO2: 21 mmol/L — AB (ref 22–32)
CREATININE: 0.6 mg/dL (ref 0.44–1.00)
Chloride: 101 mmol/L (ref 98–111)
GFR calc Af Amer: >60 mL/min (ref >60)
GFR calc non Af Amer: >60 mL/min (ref >60)
GLUCOSE: 109 mg/dL — AB (ref 70–99)
Potassium: 3.3 mmol/L — ABNORMAL LOW (ref 3.5–5.1)
Sodium: 135 mmol/L (ref 135–145)

## 2017-10-12 LAB — HEPATIC FUNCTION PANEL
ALK PHOS: 66 U/L (ref 38–126)
ALT: 17 U/L (ref 0–44)
AST: 22 U/L (ref 15–41)
Albumin: 3.7 g/dL (ref 3.5–5.0)
BILIRUBIN DIRECT: 0.2 mg/dL (ref 0.0–0.2)
BILIRUBIN INDIRECT: 1.2 mg/dL — AB (ref 0.3–0.9)
Total Bilirubin: 1.4 mg/dL — ABNORMAL HIGH (ref 0.3–1.2)
Total Protein: 8 g/dL (ref 6.5–8.1)

## 2017-10-12 LAB — I-STAT TROPONIN, ED: Troponin i, poc: 0 ng/mL (ref 0.00–0.08)

## 2017-10-12 LAB — D-DIMER, QUANTITATIVE: D-Dimer, Quant: 0.58 ug/mL-FEU — ABNORMAL HIGH (ref 0.00–0.50)

## 2017-10-12 LAB — POCT PREGNANCY, URINE: Preg Test, Ur: NEGATIVE

## 2017-10-12 LAB — I-STAT CG4 LACTIC ACID, ED: Lactic Acid, Venous: 0.84 mmol/L (ref 0.5–1.9)

## 2017-10-12 LAB — POC URINE PREG, ED: Preg Test, Ur: NEGATIVE

## 2017-10-12 LAB — LIPASE, BLOOD: Lipase: 27 U/L (ref 11–51)

## 2017-10-12 MED ORDER — IOPAMIDOL (ISOVUE-370) INJECTION 76%
INTRAVENOUS | Status: AC
  Administered 2017-10-12: 100 mL
  Filled 2017-10-12: qty 100

## 2017-10-12 MED ORDER — SODIUM CHLORIDE 0.9 % IV BOLUS
1000.0000 mL | Freq: Once | INTRAVENOUS | Status: AC
  Administered 2017-10-12: 1000 mL via INTRAVENOUS

## 2017-10-12 MED ORDER — POTASSIUM CHLORIDE CRYS ER 20 MEQ PO TBCR
40.0000 meq | EXTENDED_RELEASE_TABLET | Freq: Once | ORAL | Status: AC
  Administered 2017-10-13: 40 meq via ORAL
  Filled 2017-10-12: qty 2

## 2017-10-12 MED ORDER — LEVOFLOXACIN IN D5W 750 MG/150ML IV SOLN
750.0000 mg | Freq: Once | INTRAVENOUS | Status: AC
  Administered 2017-10-12: 750 mg via INTRAVENOUS
  Filled 2017-10-12: qty 150

## 2017-10-12 NOTE — ED Provider Notes (Signed)
MSE was initiated and I personally evaluated the patient and placed orders (if any) at  4:38 PM on October 12, 2017.  The patient appears stable so that the remainder of the MSE may be completed by another provider.  Patient placed in Quick Look pathway, seen and evaluated   Chief Complaint: chest pain, shortness of breath  HPI:   27 year old female presents to ED for evaluation of 4-day history of acute onset of shortness of breath, chest pain, dry cough.  She was at work today when her coworker checked her pulse and was told it was 125 and that her "right lungs sounded congested."  Patient sent over from urgent care to rule out PE or other cardiac cause of symptoms.  She denies any increased stress or anxiety related to work at home.  Denies any prior history of PE or MI.  Denies any OCP use, recent surgeries, recent prolonged travel.  Has been using her grandmothers inhaler with only mild improvement in symptoms.  ROS: Chest pain, shortness of breath  Physical Exam:   Gen: No distress  Neuro: Awake and Alert  Skin: Warm    Focused Exam: Tachycardic to 115. Lungs CTAB.    Initiation of care has begun. The patient has been counseled on the process, plan, and necessity for staying for the completion/evaluation, and the remainder of the medical screening examination    Dietrich PatesKhatri, Reed Eifert, PA-C 10/12/17 1641    Derwood KaplanNanavati, Ankit, MD 10/16/17 1512

## 2017-10-12 NOTE — ED Triage Notes (Signed)
Pt reports sob that has been going on for 4 days. Pt reports right cp when coughing 6/10. Pt sent from UC to rule out PE.  Dry cough, pt reports vomiting 4x today, yellow bile. No recent travel, no recent surgeries, not on BC pills.

## 2017-10-12 NOTE — ED Triage Notes (Signed)
Pt reports atypical SOB x4 days.  She reports pain in her right lung when she coughs from the SOB.  She reports having tachycardia at times, one reading at 125. She states she is unable to lie down on her side at night, she has to lay on her back.  Yesterday she states she became diaphoretic and nauseated and vomited x4 days. She reports diaphoresis at night.  She has not eaten in several days due to her nausea.  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

## 2017-10-12 NOTE — ED Provider Notes (Addendum)
[facility] Provider [facility]   CSN: [id number] Arrival date & time: 10/12/17  1629     History   Chief Complaint Chief Complaint  Patient presents with  . Shortness of Breath    HPI Stephanie Young is a 27 y.o. female who presents with shortness of breath.  Past medical history significant for H. pylori, kidney stones.  The patient states that she has had a occasional dry cough for the past month which is associated with chest pain with coughing.  It was not persistent or severe so she did not think much of it.  Over the past 4 days she reports becoming short of breath when she gets up and with any activity.  She is had difficulty sleeping due to her shortness of breath.  She reports associated fatigue and generalized malaise, diaphoresis and chills.  She is unsure of a fever.  Last night she started having nausea and vomiting and burning epigastric abdominal pain.  This continued today.  She tried to go to work however due to fatigue and shortness of breath she decided to go to urgent care.  Urgent care advised her to come to the ED to rule out PE. No recent surgery/travel/immobilization, hx of cancer, leg swelling, hemoptysis, prior DVT/PE, or hormone use.  She has been using a family members inhaler with moderate relief of her shortness of breath. No diarrhea or urinary symptoms. 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  HPI  Past Medical History:  Diagnosis Date  . H. pylori infection   . Kidney stones     Patient Active Problem List   Diagnosis Date Noted  . 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 Abdominal cramping 05/24/2017    Past Surgical History:  Procedure Laterality Date  . none       OB History   None      Home Medications    Prior to Admission medications   Medication Sig Start Date End Date Taking? Authorizing Provider  acetaminophen (TYLENOL) 500 MG tablet Take 500 mg by mouth every 6 (six) hours as needed.    [provider]  dicyclomine (BENTYL) 10 MG capsule Take 1 capsule (10 mg total) by mouth 4 (four) times daily -  before meals and at bedtime. As needed for abdominal cramping and loose stool 09/13/17   Gelene MinkBoone, Anna W, NP  hydrocortisone (ANUSOL-HC) 2.5 % rectal cream Place 1 application rectally 2 (two) times daily. As needed for rectal discomfort 09/13/17   Gelene MinkBoone, Anna W, NP  tiZANidine (ZANAFLEX) 4 MG tablet Take 1 tablet (4 mg total) by mouth every 6 (six) hours as needed for muscle spasms. 09/27/17   Claiborne RiggFleming, [name], NP    Family History Family History  Problem Relation Age of Onset  . Breast cancer Mother 2064  . Hypertension Mother   . Hypertension Brother   . Diabetes Maternal Uncle   . Diabetes Other   . Colon cancer Neg Hx   . Colon polyps Neg Hx     Social History Social History   Tobacco Use  . Smoking status: Former Games developermoker  . Smokeless tobacco: Never Used  Substance Use Topics  . Alcohol use: Yes    Comment: socially   . Drug use: Yes    Types: Marijuana    Comment: socially     Allergies   Patient has no known allergies.   Review of Systems Review of Systems  Constitutional: Positive for appetite change, chills,  diaphoresis and fatigue. Negative for fever.  Respiratory: Positive for cough and shortness of breath.   Cardiovascular: Positive for chest pain (with coughing). Negative for palpitations and leg swelling.  Gastrointestinal: Positive for abdominal pain (epigastric), nausea and vomiting. Negative for diarrhea.  Genitourinary: Negative for dysuria.  All other systems reviewed and are negative.    Physical Exam Updated Vital Signs BP (!) 130/94   Pulse (!) 110   Temp 99.2 F (37.3 C) (Oral)   Resp 18   Ht 5\' 5"  (1.651 m)   Wt 71.2 kg (157 lb)   LMP 09/13/2017   SpO2 98%   BMI 26.13 kg/m   Physical Exam  Constitutional: She is oriented to person, place, and time. She appears well-developed and well-nourished. No distress.  Calm, cooperative.   Tachypnea  HENT:  Head: Normocephalic and atraumatic.  Eyes: Pupils are equal, round, and reactive to light. Conjunctivae are normal. Right eye exhibits no discharge. Left eye exhibits no discharge. No scleral icterus.  Neck: Normal range of motion.  Cardiovascular: Tachycardia present. Exam reveals no gallop and no friction rub.  No murmur heard. Pulmonary/Chest: Effort normal. No stridor. Tachypnea noted. No respiratory distress. She has no wheezes. She has rales in the right middle field. She exhibits no tenderness.  Abdominal: Soft. Bowel sounds are normal. She exhibits no distension. There is no tenderness.  Musculoskeletal:  No peripheral edema  Neurological: She is alert and oriented to person, place, and time.  Skin: Skin is warm and dry.  Psychiatric: She has a normal mood and affect. Her behavior is normal.  Nursing note and vitals reviewed.    ED Treatments / Results  Labs (all labs ordered are listed, but only abnormal results are displayed) Labs Reviewed  BASIC METABOLIC PANEL - Abnormal; Notable for the following components:      Result Value   Potassium 3.3 (*)    CO2 21 (*)    Glucose, Bld 109 (*)    All other components within normal limits  CBC WITH DIFFERENTIAL/PLATELET - Abnormal; Notable for the following components:   WBC 16.8 (*)    Hemoglobin 11.4 (*)    HCT 35.6 (*)    Neutro Abs 16.0 (*)    Lymphs Abs 0.5 (*)    All other components within normal limits  D-DIMER, QUANTITATIVE (NOT AT [facility]) - Abnormal; Notable for the following components:   D-Dimer, Quant 0.58 (*)    All other components within normal limits  I-STAT TROPONIN, ED    EKG EKG Interpretation  Date/Time:  Wednesday October 12 2017 16:43:19 EDT Ventricular Rate:  101 PR Interval:  140 QRS Duration: 108 QT Interval:  338 QTC Calculation: 438 R Axis:   80 Text Interpretation:  Sinus tachycardia Incomplete right bundle branch block Borderline ECG No significant change was found  Confirmed by Paula Libra ([id number]) on 10/13/2017 12:53:12 PM   Radiology Dg Chest 2 View  Result Date: 10/12/2017 CLINICAL DATA:  Shortness of breath and productive cough for 4 days. EXAM: CHEST - 2 VIEW COMPARISON:  None. FINDINGS: The heart size and mediastinal contours are within normal limits. There is no focal infiltrate, pulmonary edema, or pleural effusion. The visualized skeletal structures are unremarkable. IMPRESSION: No active cardiopulmonary disease. Electronically Signed   By: Sherian Rein M.D.   On: 10/12/2017 15:27    Procedures Procedures (including critical care time)  Medications Ordered in ED Medications  potassium chloride SA (K-DUR,KLOR-CON) CR tablet 40 mEq (has no administration in time range)  sodium chloride 0.9 % bolus 1,000 mL (0 mLs Intravenous Stopped 10/12/17 2102)  iopamidol (ISOVUE-370) 76 % injection (100 mLs  Contrast Given 10/12/17 1919)  sodium chloride 0.9 % bolus 1,000 mL (0 mLs Intravenous Stopped 10/12/17 2252)  levofloxacin (LEVAQUIN) IVPB 750 mg (0 mg Intravenous Stopped 10/12/17 2252)     Initial Impression / Assessment and Plan / ED Course  I have reviewed the triage vital signs and the nursing notes.  Pertinent labs & imaging results that were available during my care of the patient were reviewed by me and considered in my medical decision making (see chart for details).  27 year old female presents with cough, diaphoresis, shortness of breath for the past several days.  On initial exam she is tachycardic and tachypneic.  She is hypertensive and not hypoxic. Her lung exam is remarkable for rales in the right middle lung field.  CBC is remarkable for leukocytosis of 16.8.  BMP is remarkable for mild hypokalemia.  Hepatic function is normal.  Troponin is normal.  Her history is concerning for pneumonia however CXR is clear. D-dimer was elevated and therefore CT of the chest was ordered to rule out PE and further characterize. Will hold off on antibiotics  until etiology is more clear.  8:00 PM CT is negative for PE but remarkable for multifocal pneumonia. Sepsis order set was utilized and blood cultures, lactic acid were obtained. Initial lactic acid is normal. She was given 30cc/kg bolus and Levaquin.  She was ambulated and maintained her O2 sats but heart rate was in the 130s and she still had tachypnea. Will discuss with hospitalist regarding admission.  12:40 AM Spoke with Dr. Clyde Lundborg who will come to admit   Final Clinical Impressions(s) / ED Diagnoses   Final diagnoses:  Multifocal pneumonia    ED Discharge Orders    None       Bethel Born, PA-C 10/13/17 0041    Charlynne Pander, MD 10/13/17 1453    Bethel Born, PA-C 10/23/17 1647    Charlynne Pander, MD 10/27/17 [phone]

## 2017-10-12 NOTE — ED Provider Notes (Signed)
[facility]   [id number] 10/12/17 Arrival Time: 1410  ASSESSMENT & PLAN:  1. SOB (shortness of breath)   2. Chest pain, unspecified type   3. Tachycardia    Unclear etiology. CXR normal. EKG with sinus tachycardia. Discussed importance of ruling out PE even with lower suspicion. Will proceed to the ED for further evaluation.  Stable upon discharge.  Reviewed expectations re: course of current medical issues. Questions answered. Outlined signs and symptoms indicating need for more acute intervention. Patient verbalized understanding. After Visit Summary given.   SUBJECTIVE:  Stephanie Young is a 27 y.o. female who presents with complaint of fairly abrupt onset of shortness of breath at rest and with exertion approximately 4 days ago. Gradual worsening. Describes anterior upper R chest discomfort (described as an ache) at onset and continuing sporadically. HR of 125 at work today. Associated episodes of significant diaphoresis at night and at work today. Nausea with emesis also reported; sporadic. Overall decreased PO intake. Also reports associated cough without hemoptysis. R sided chest discomfort sometimes worse at night, esp when supine; "can't really get comfortable". SOB is non-positional. No h/o similar symptoms. No LE edema. No recent travel or surgery. No abdominal pain reported. No specific aggravating or alleviating factors reported. No stress/anxiety reported. No drug use reported.  Patient's last menstrual period was 09/13/2017.  No OCP use.  Social History   Tobacco Use  Smoking Status Former Smoker  Smokeless Tobacco Never Used   No OTC treatment.  ROS: As per HPI. All other systems negative.  OBJECTIVE:  Vitals:   10/12/17 1448  BP: (!) 149/100  Pulse: (!) 115  Temp: 99.7 F (37.6 C)  TempSrc: Oral  SpO2: 98%    Elevated BP and tachycardia noted.  General appearance: alert Eyes: EOMI; PERRLA HENT: normocephalic; atraumatic Neck:  supple Lungs: breathing heavily on exam table but able to speak full sentences; clear to auscultation bilaterally Heart: regular; tachycardic Chest Wall: no tenderness with palpation Abdomen: soft, non-tender; bowel sounds normal Extremities: no edema; symmetrical with no gross deformities Skin: warm and dry Psychological: alert and cooperative; normal mood and affect  ECG: Orders placed or performed during the hospital encounter of 10/12/17  . ED EKG  . ED EKG    Labs: Results for orders placed or performed during the hospital encounter of 10/12/17  Pregnancy, urine POC  Result Value Ref Range   Preg Test, Ur NEGATIVE NEGATIVE   Labs Reviewed  POCT PREGNANCY, URINE    Imaging: Dg Chest 2 View  Result Date: 10/12/2017 CLINICAL DATA:  Shortness of breath and productive cough for 4 days. EXAM: CHEST - 2 VIEW COMPARISON:  None. FINDINGS: The heart size and mediastinal contours are within normal limits. There is no focal infiltrate, pulmonary edema, or pleural effusion. The visualized skeletal structures are unremarkable. IMPRESSION: No active cardiopulmonary disease. Electronically Signed   By: Sherian Rein M.D.   On: 10/12/2017 15:27    No Known Allergies  Past Medical History:  Diagnosis Date  . H. pylori infection   . Kidney stones    Social History   Socioeconomic History  . Marital status: Single    Spouse name: Not on file  . Number of children: Not on file  . Years of education: Not on file  . Highest education level: Not on file  Occupational History  . Not on file  Social Needs  . Financial resource strain: Not on file  . Food insecurity:    Worry: Not on  file    Inability: Not on file  . Transportation needs:    Medical: Not on file    Non-medical: Not on file  Tobacco Use  . Smoking status: Former Games developermoker  . Smokeless tobacco: Never Used  Substance and Sexual Activity  . Alcohol use: Yes    Comment: socially   . Drug use: Yes    Types: Marijuana     Comment: socially  . Sexual activity: Yes    Partners: Male  Lifestyle  . Physical activity:    Days per week: Not on file    Minutes per session: Not on file  . Stress: Not on file  Relationships  . Social connections:    Talks on phone: Not on file    Gets together: Not on file    Attends religious service: Not on file    Active member of club or organization: Not on file    Attends meetings of clubs or organizations: Not on file    Relationship status: Not on file  . Intimate partner violence:    Fear of current or ex partner: Not on file    Emotionally abused: Not on file    Physically abused: Not on file    Forced sexual activity: Not on file  Other Topics Concern  . Not on file  Social History Narrative  . Not on file   Family History  Problem Relation Age of Onset  . Breast cancer Mother 3264  . Hypertension Mother   . Hypertension Brother   . Diabetes Maternal Uncle   . Diabetes Other   . Colon cancer Neg Hx   . Colon polyps Neg Hx    Past Surgical History:  Procedure Laterality Date  . none       Mardella LaymanHagler, Virgel Haro, MD 10/12/17 [phone]

## 2017-10-13 ENCOUNTER — Encounter (HOSPITAL_COMMUNITY): Payer: Self-pay | Admitting: Internal Medicine

## 2017-10-13 DIAGNOSIS — J189 Pneumonia, unspecified organism: Secondary | ICD-10-CM | POA: Diagnosis present

## 2017-10-13 DIAGNOSIS — J181 Lobar pneumonia, unspecified organism: Secondary | ICD-10-CM | POA: Diagnosis present

## 2017-10-13 DIAGNOSIS — E876 Hypokalemia: Secondary | ICD-10-CM | POA: Diagnosis present

## 2017-10-13 DIAGNOSIS — A419 Sepsis, unspecified organism: Secondary | ICD-10-CM | POA: Diagnosis present

## 2017-10-13 LAB — URINALYSIS, ROUTINE W REFLEX MICROSCOPIC
Bacteria, UA: NONE SEEN
Bilirubin Urine: NEGATIVE
Glucose, UA: NEGATIVE mg/dL
Ketones, ur: 20 mg/dL — AB
Leukocytes, UA: NEGATIVE
Nitrite: NEGATIVE
Protein, ur: NEGATIVE mg/dL
Specific Gravity, Urine: 1.011 (ref 1.005–1.030)
pH: 5 (ref 5.0–8.0)

## 2017-10-13 LAB — RESPIRATORY PANEL BY PCR
ADENOVIRUS-RVPPCR: NOT DETECTED
Bordetella pertussis: NOT DETECTED
CHLAMYDOPHILA PNEUMONIAE-RVPPCR: NOT DETECTED
CORONAVIRUS 229E-RVPPCR: NOT DETECTED
CORONAVIRUS HKU1-RVPPCR: NOT DETECTED
CORONAVIRUS NL63-RVPPCR: NOT DETECTED
CORONAVIRUS OC43-RVPPCR: NOT DETECTED
INFLUENZA A-RVPPCR: NOT DETECTED
Influenza B: NOT DETECTED
MYCOPLASMA PNEUMONIAE-RVPPCR: NOT DETECTED
Metapneumovirus: NOT DETECTED
PARAINFLUENZA VIRUS 1-RVPPCR: NOT DETECTED
Parainfluenza Virus 2: NOT DETECTED
Parainfluenza Virus 3: NOT DETECTED
Parainfluenza Virus 4: NOT DETECTED
Respiratory Syncytial Virus: NOT DETECTED
Rhinovirus / Enterovirus: NOT DETECTED

## 2017-10-13 LAB — LACTIC ACID, PLASMA: Lactic Acid, Venous: 0.8 mmol/L (ref 0.5–1.9)

## 2017-10-13 LAB — STREP PNEUMONIAE URINARY ANTIGEN: STREP PNEUMO URINARY ANTIGEN: NEGATIVE

## 2017-10-13 LAB — HIV ANTIBODY (ROUTINE TESTING W REFLEX): HIV SCREEN 4TH GENERATION: NONREACTIVE

## 2017-10-13 LAB — PROCALCITONIN: Procalcitonin: 0.56 ng/mL

## 2017-10-13 MED ORDER — SODIUM CHLORIDE 0.9 % IV BOLUS
500.0000 mL | Freq: Once | INTRAVENOUS | Status: AC
  Administered 2017-10-13: 500 mL via INTRAVENOUS

## 2017-10-13 MED ORDER — SODIUM CHLORIDE 0.9 % IV SOLN
1.0000 g | INTRAVENOUS | Status: DC
  Administered 2017-10-13: 1 g via INTRAVENOUS
  Filled 2017-10-13: qty 10

## 2017-10-13 MED ORDER — ALUM & MAG HYDROXIDE-SIMETH 200-200-20 MG/5ML PO SUSP
30.0000 mL | ORAL | Status: DC | PRN
  Filled 2017-10-13: qty 30

## 2017-10-13 MED ORDER — POTASSIUM CHLORIDE CRYS ER 20 MEQ PO TBCR
40.0000 meq | EXTENDED_RELEASE_TABLET | Freq: Once | ORAL | Status: AC
  Administered 2017-10-13: 40 meq via ORAL
  Filled 2017-10-13: qty 2

## 2017-10-13 MED ORDER — LEVALBUTEROL HCL 1.25 MG/0.5ML IN NEBU
1.2500 mg | INHALATION_SOLUTION | Freq: Four times a day (QID) | RESPIRATORY_TRACT | Status: DC
  Administered 2017-10-13 – 2017-10-14 (×6): 1.25 mg via RESPIRATORY_TRACT
  Filled 2017-10-13 (×8): qty 0.5

## 2017-10-13 MED ORDER — GUAIFENESIN ER 600 MG PO TB12
1200.0000 mg | ORAL_TABLET | Freq: Two times a day (BID) | ORAL | Status: DC
  Administered 2017-10-13 – 2017-10-14 (×3): 1200 mg via ORAL
  Filled 2017-10-13 (×3): qty 2

## 2017-10-13 MED ORDER — LEVOFLOXACIN IN D5W 750 MG/150ML IV SOLN: 750.0000 mg | Freq: Once | INTRAVENOUS | Status: DC

## 2017-10-13 MED ORDER — POTASSIUM CHLORIDE 20 MEQ/15ML (10%) PO SOLN
40.0000 meq | Freq: Once | ORAL | Status: AC
  Administered 2017-10-13: 40 meq via ORAL
  Filled 2017-10-13: qty 30

## 2017-10-13 MED ORDER — ENOXAPARIN SODIUM 40 MG/0.4ML ~~LOC~~ SOLN
40.0000 mg | Freq: Every day | SUBCUTANEOUS | Status: DC
  Administered 2017-10-13 – 2017-11-01 (×20): 40 mg via SUBCUTANEOUS
  Filled 2017-10-13 (×20): qty 0.4

## 2017-10-13 MED ORDER — TIZANIDINE HCL 4 MG PO TABS
4.0000 mg | ORAL_TABLET | Freq: Four times a day (QID) | ORAL | Status: DC | PRN
  Filled 2017-10-13 (×2): qty 1

## 2017-10-13 MED ORDER — DICYCLOMINE HCL 10 MG PO CAPS
10.0000 mg | ORAL_CAPSULE | Freq: Four times a day (QID) | ORAL | Status: DC | PRN
  Administered 2017-10-13: 10 mg via ORAL
  Filled 2017-10-13 (×5): qty 1

## 2017-10-13 MED ORDER — SODIUM CHLORIDE 0.9 % IV SOLN
1.0000 g | INTRAVENOUS | Status: DC
  Administered 2017-10-13 – 2017-10-14 (×2): 1 g via INTRAVENOUS
  Filled 2017-10-13 (×2): qty 10

## 2017-10-13 MED ORDER — SODIUM CHLORIDE 0.9 % IV SOLN
500.0000 mg | INTRAVENOUS | Status: DC
  Administered 2017-10-13 – 2017-10-15 (×3): 500 mg via INTRAVENOUS
  Filled 2017-10-13 (×4): qty 500

## 2017-10-13 MED ORDER — ONDANSETRON HCL 4 MG/2ML IJ SOLN
4.0000 mg | Freq: Three times a day (TID) | INTRAMUSCULAR | Status: DC | PRN
  Administered 2017-10-14 – 2017-10-16 (×4): 4 mg via INTRAVENOUS
  Filled 2017-10-13 (×4): qty 2

## 2017-10-13 MED ORDER — CHOLESTYRAMINE LIGHT 4 G PO PACK
4.0000 g | PACK | Freq: Four times a day (QID) | ORAL | Status: AC
  Administered 2017-10-13 – 2017-10-14 (×5): 4 g via ORAL
  Filled 2017-10-13 (×8): qty 1

## 2017-10-13 MED ORDER — DM-GUAIFENESIN ER 30-600 MG PO TB12
1.0000 | ORAL_TABLET | Freq: Two times a day (BID) | ORAL | Status: DC | PRN
  Administered 2017-10-14 – 2017-10-15 (×2): 1 via ORAL
  Filled 2017-10-13 (×4): qty 1

## 2017-10-13 MED ORDER — ACETAMINOPHEN 325 MG PO TABS
650.0000 mg | ORAL_TABLET | Freq: Four times a day (QID) | ORAL | Status: DC | PRN
  Administered 2017-10-13 – 2017-10-22 (×16): 650 mg via ORAL
  Filled 2017-10-13 (×17): qty 2

## 2017-10-13 MED ORDER — SODIUM CHLORIDE 0.9 % IV SOLN
INTRAVENOUS | Status: DC
  Administered 2017-10-13 – 2017-10-21 (×6): via INTRAVENOUS

## 2017-10-13 MED ORDER — STERILE WATER FOR INJECTION IJ SOLN
INTRAMUSCULAR | Status: AC
  Administered 2017-10-13: 04:00:00
  Filled 2017-10-13: qty 10

## 2017-10-13 MED ORDER — ZOLPIDEM TARTRATE 5 MG PO TABS
5.0000 mg | ORAL_TABLET | Freq: Every evening | ORAL | Status: DC | PRN
  Administered 2017-10-13: 5 mg via ORAL
  Filled 2017-10-13: qty 1

## 2017-10-13 MED ORDER — LEVOFLOXACIN IN D5W 750 MG/150ML IV SOLN: 750.0000 mg | INTRAVENOUS | Status: DC

## 2017-10-13 NOTE — ED Notes (Signed)
Attempted report 

## 2017-10-13 NOTE — H&P (Addendum)
History and Physical    Stephanie Young VHQ:[id number] DOB: 08/09/1990 DOA: 10/12/2017  Referring MD/NP/PA:   PCP: Claiborne RiggFleming, [name], NP   Patient coming from:  The patient is coming from home.  At baseline, pt is independent for most of ADL.   Chief Complaint: SOB and cough  HPI: Stephanie Young is a 27 y.o. female without significant past medical history, who presents with a cough, shortness of breath and chest pain.  Patient states that she has been having cough, shortness of breath and chest pain for almost 4 days. She coughs up clear mucus. Her chest pain is located in the frontal chest, sharp, mild, nonradiating, pleuritic, aggravated by deep breath and coughing. Patient has a subjective fever and chills. She states she has nausea and vomited several times, which has resolved. Currently patient does not have nausea, vomiting, diarrhea or abdominal pain. No symptoms of UTI. No unilateral weakness.  ED Course: pt was found to have  WBC 16.8, lactic acid 0.84, negative pregnancy test, potassium 3.3, creatinine normal, temperature 99.7, tachycardia, tachypnea, O2 sat are 96% on room air. CT angiogram is negative for PE, but showed multifocal pneumonia. Patient is placed on telemetry bed for observation.  Review of Systems:   General: has subjective fevers, chills, no body weight gain, has fatigue HEENT: no blurry vision, hearing changes or sore throat Respiratory: no dyspnea, coughing, wheezing CV: has chest pain, no palpitations GI: has nausea, vomiting, no abdominal pain, diarrhea, constipation GU: no dysuria, burning on urination, increased urinary frequency, hematuria  Ext: no leg edema Neuro: no unilateral weakness, numbness, or tingling, no vision change or hearing loss Skin: no rash, no skin tear. MSK: No muscle spasm, no deformity, no limitation of range of movement in spin Heme: No easy bruising.  Travel history: No recent long distant travel.  Allergy: No Known  Allergies  Past Medical History:  Diagnosis Date  . H. pylori infection   . Kidney stones     Past Surgical History:  Procedure Laterality Date  . dental procedure      Social History:  reports that she has quit smoking. She has never used smokeless tobacco. She reports that she drinks alcohol. She reports that she has current or past drug history. Drug: Marijuana.  Family History:  Family History  Problem Relation Age of Onset  . Breast cancer Mother 1264  . Hypertension Mother   . Hypertension Brother   . Diabetes Maternal Uncle   . Diabetes Other   . Colon cancer Neg Hx   . Colon polyps Neg Hx      Prior to Admission medications   Medication Sig Start Date End Date Taking? Authorizing Provider  acetaminophen (TYLENOL) 500 MG tablet Take 1,000 mg by mouth every 6 (six) hours as needed for moderate pain.    Yes [provider]  albuterol (PROVENTIL HFA;VENTOLIN HFA) 108 (90 Base) MCG/ACT inhaler Inhale 2 puffs into the lungs every 6 (six) hours as needed for wheezing or shortness of breath.   Yes [provider]  dicyclomine (BENTYL) 10 MG capsule Take 1 capsule (10 mg total) by mouth 4 (four) times daily -  before meals and at bedtime. As needed for abdominal cramping and loose stool 09/13/17  Yes Gelene MinkBoone, Anna W, NP  tiZANidine (ZANAFLEX) 4 MG tablet Take 1 tablet (4 mg total) by mouth every 6 (six) hours as needed for muscle spasms. 09/27/17  Yes Claiborne RiggFleming, [name], NP  hydrocortisone (ANUSOL-HC) 2.5 % rectal cream Place  1 application rectally 2 (two) times daily. As needed for rectal discomfort Patient not taking: Reported on 10/12/2017 09/13/17   Gelene Mink, NP    Physical Exam: Vitals:   10/13/17 0007 10/13/17 0015 10/13/17 0045 10/13/17 0100  BP: 125/71 117/60 117/64 (!) 106/58  Pulse: (!) 127 (!) 114 (!) 113 (!) 114  Resp: (!) 38 (!) 24 18 18   Temp:      TempSrc:      SpO2: 98% 96% 97% 96%  Weight:      Height:       General: Not in acute  distress HEENT:       Eyes: PERRL, EOMI, no scleral icterus.       ENT: No discharge from the ears and nose, no pharynx injection, no tonsillar enlargement.        Neck: No JVD, no bruit, no mass felt. Heme: No neck lymph node enlargement. Cardiac: S1/S2, RRR, No murmurs, No gallops or rubs. Respiratory: No rales, wheezing, rhonchi or rubs. GI: Soft, nondistended, nontender, no rebound pain, no organomegaly, BS present. GU: No hematuria Ext: No pitting leg edema bilaterally. 2+DP/PT pulse bilaterally. Musculoskeletal: No joint deformities, No joint redness or warmth, no limitation of ROM in spin. Skin: No rashes.  Neuro: Alert, oriented X3, cranial nerves II-XII grossly intact, moves all extremities normally. Psych: Patient is not psychotic, no suicidal or hemocidal ideation.  Labs on Admission: I have personally reviewed following labs and imaging studies  CBC: Recent Labs  Lab 10/12/17 1643  WBC 16.8*  NEUTROABS 16.0*  HGB 11.4*  HCT 35.6*  MCV 86.6  PLT 398   Basic Metabolic Panel: Recent Labs  Lab 10/12/17 1643  NA 135  K 3.3*  CL 101  CO2 21*  GLUCOSE 109*  BUN 6  CREATININE 0.60  CALCIUM 9.3   GFR: Estimated Creatinine Clearance: 105.5 mL/min (by C-G formula based on SCr of 0.6 mg/dL). Liver Function Tests: Recent Labs  Lab 10/12/17 1911  AST 22  ALT 17  ALKPHOS 66  BILITOT 1.4*  PROT 8.0  ALBUMIN 3.7   Recent Labs  Lab 10/12/17 1911  LIPASE 27   No results for input(s): AMMONIA in the last 168 hours. Coagulation Profile: No results for input(s): INR, PROTIME in the last 168 hours. Cardiac Enzymes: No results for input(s): CKTOTAL, CKMB, CKMBINDEX, TROPONINI in the last 168 hours. BNP (last 3 results) No results for input(s): PROBNP in the last 8760 hours. HbA1C: No results for input(s): HGBA1C in the last 72 hours. CBG: No results for input(s): GLUCAP in the last 168 hours. Lipid Profile: No results for input(s): CHOL, HDL, LDLCALC, TRIG,  CHOLHDL, LDLDIRECT in the last 72 hours. Thyroid Function Tests: No results for input(s): TSH, T4TOTAL, FREET4, T3FREE, THYROIDAB in the last 72 hours. Anemia Panel: No results for input(s): VITAMINB12, FOLATE, FERRITIN, TIBC, IRON, RETICCTPCT in the last 72 hours. Urine analysis:    Component Value Date/Time   COLORURINE YELLOW 08/31/2016 1039   APPEARANCEUR CLOUDY (A) 08/31/2016 1039   LABSPEC 1.021 08/31/2016 1039   PHURINE 5.0 08/31/2016 1039   GLUCOSEU NEGATIVE 08/31/2016 1039   HGBUR LARGE (A) 08/31/2016 1039   BILIRUBINUR small 02/25/2017 1428   KETONESUR 20 (A) 08/31/2016 1039   PROTEINUR 100 02/25/2017 1428   PROTEINUR 30 (A) 08/31/2016 1039   UROBILINOGEN 0.2 02/25/2017 1428   NITRITE negative 02/25/2017 1428   NITRITE NEGATIVE 08/31/2016 1039   LEUKOCYTESUR Trace (A) 02/25/2017 1428   Sepsis Labs: @LABRCNTIP (procalcitonin:4,lacticidven:4) )No results  found for this or any previous visit (from the past 240 hour(s)).   Radiological Exams on Admission: Dg Chest 2 View  Result Date: 10/12/2017 CLINICAL DATA:  Shortness of breath and productive cough for 4 days. EXAM: CHEST - 2 VIEW COMPARISON:  None. FINDINGS: The heart size and mediastinal contours are within normal limits. There is no focal infiltrate, pulmonary edema, or pleural effusion. The visualized skeletal structures are unremarkable. IMPRESSION: No active cardiopulmonary disease. Electronically Signed   By: Sherian Rein M.D.   On: 10/12/2017 15:27   Ct Angio Chest Pe W/cm &/or Wo Cm  Result Date: 10/12/2017 CLINICAL DATA:  Shortness of breath and cough for 4 days. EXAM: CT ANGIOGRAPHY CHEST WITH CONTRAST TECHNIQUE: Multidetector CT imaging of the chest was performed using the standard protocol during bolus administration of intravenous contrast. Multiplanar CT image reconstructions and MIPs were obtained to evaluate the vascular anatomy. CONTRAST:  ISOVUE-370 IOPAMIDOL (ISOVUE-370) INJECTION 76% COMPARISON:   Chest radiograph October 12, 2017 FINDINGS: CARDIOVASCULAR: Suboptimal contrast opacification of the pulmonary artery's (main pulmonary artery is 120 Hounsfield units, target is 250 Hounsfield units). Main pulmonary artery is not enlarged. No pulmonary arterial filling defects to the level of the lobar branches. Admixture of contrast and respiratory motion limits sensitivity for segmental and subsegmental emboli heart size is normal. No pericardial effusion. Thoracic aorta is normal course and caliber, unremarkable. MEDIASTINUM/NODES: No lymphadenopathy by CT size criteria. LUNGS/PLEURA: Tracheobronchial tree is patent, no pneumothorax. Multifocal dense consolidation bilateral lungs, confident in the lower lobes. No pleural effusion. UPPER ABDOMEN: Included view of the abdomen is unremarkable. MUSCULOSKELETAL: Visualized soft tissues and included osseous structures appear normal. Review of the MIP images confirms the above findings. IMPRESSION: 1. No acute central pulmonary embolus. 2. Multifocal pneumonia. Electronically Signed   By: Awilda Metro M.D.   On: 10/12/2017 19:53     EKG: Independently reviewed.  Sinus rhythm, QTC 485, incomplete right bundle blockage  Assessment/Plan Principal Problem:   Lobar pneumonia (HCC) Active Problems:   Hypokalemia   Sepsis (HCC)   Sepsis due to lobar pneumonia (multifocal pneumonia): atient meets criteria for sepsis with leukocytosis, tachycardia and tachypnea. Lactic acid is normal. Hemodynamically stable.  - Will place on telemetry bed for obs. - Levaquin was started by EDP-->will continue - Mucinex for cough  - prn Xopenex Neb prn for SOB - Urine legionella and S. pneumococcal antigen - Follow up blood culture x2, sputum culture and respiratory virus panel - will get Procalcitonin and trend lactic acid level per sepsis protocol - IVF: 2L + 500 ml of NS bolus, followed by 125 mL per hour of NS   Hypokalemia: K=3.3   on admission. -  Repleted   DVT ppx:  SQ Lovenox Code Status: Full code Family Communication:  Yes, patient's mother   at bed side Disposition Plan:  Anticipate discharge back to previous home environment Consults called:  none Admission status: Obs / tele    Date of Service 10/13/2017    Lorretta Harp [facility] Pager [phone]  If 7PM-7AM, please contact night-coverage [email or web address] Password [email or web address] 10/13/2017, 3:02 AM

## 2017-10-13 NOTE — ED Notes (Signed)
Attempting report

## 2017-10-13 NOTE — ED Notes (Signed)
PT ambulated in hall on monitor. Pulse: 130 O2: 98

## 2017-10-13 NOTE — Progress Notes (Signed)
Patient seen and evaluated, chart reviewed, please see EMR for updated orders. Please see full H&P dictated by admitting physician Dr Clyde LundborgNiu for same date of service.    27 year old otherwise healthy female admitted with community-acquired pneumonia, white count 16.8, lactic acid is not elevated  Patient's mom is at bedside, patient is quite symptomatic with mostly dry cough and tachycardia as well as tachypnea.  No significant hypoxia     Community-acquired pneumonia/Multi-focal---azithromycin, continue mucolytics and bronchodilators (switch to Xopenex due to tachycardia), continue to hydrate    Patient seen and evaluated, chart reviewed, please see EMR for updated orders. Please see full H&P dictated by admitting physician Dr Clyde LundborgNiu for same date of service.   Shon Haleourage Purva Vessell, MD

## 2017-10-13 NOTE — Progress Notes (Signed)
Pt arrived from ED via wheelchair.  Alert and oriented x4.  Boyfriend at bedside.  Telemetry applied. IV in RAC infusing NS @150 /hr.  Denies any pain, SOB, N/V.  Oriented to room.  Updated on plan of care.  NSR/ST on telemetry.  Call light within reach, will continue to monitor.

## 2017-10-13 NOTE — ED Notes (Signed)
[place]

## 2017-10-13 NOTE — Progress Notes (Signed)
Pt had temp of 103.1.  Night shift MD paged to notify.  Tylenol given.  Awaiting any new orders.  Report given to night shift RN.  Will continue to monitor.

## 2017-10-13 NOTE — ED Notes (Signed)
Admitting at bedside 

## 2017-10-14 LAB — CBC
HCT: 30.9 % — ABNORMAL LOW (ref 36.0–46.0)
HEMOGLOBIN: 9.9 g/dL — AB (ref 12.0–15.0)
MCH: 27.9 pg (ref 26.0–34.0)
MCHC: 32 g/dL (ref 30.0–36.0)
MCV: 87 fL (ref 78.0–100.0)
PLATELETS: 342 10*3/uL (ref 150–400)
RBC: 3.55 MIL/uL — ABNORMAL LOW (ref 3.87–5.11)
RDW: 13.5 % (ref 11.5–15.5)
WBC: 13.4 10*3/uL — ABNORMAL HIGH (ref 4.0–10.5)

## 2017-10-14 LAB — RESPIRATORY PANEL BY PCR
ADENOVIRUS-RVPPCR: NOT DETECTED
Bordetella pertussis: NOT DETECTED
CORONAVIRUS 229E-RVPPCR: NOT DETECTED
CORONAVIRUS HKU1-RVPPCR: NOT DETECTED
CORONAVIRUS NL63-RVPPCR: NOT DETECTED
Chlamydophila pneumoniae: NOT DETECTED
Coronavirus OC43: NOT DETECTED
Influenza A: NOT DETECTED
Influenza B: NOT DETECTED
MYCOPLASMA PNEUMONIAE-RVPPCR: NOT DETECTED
Metapneumovirus: NOT DETECTED
Parainfluenza Virus 1: NOT DETECTED
Parainfluenza Virus 2: NOT DETECTED
Parainfluenza Virus 3: NOT DETECTED
Parainfluenza Virus 4: NOT DETECTED
Respiratory Syncytial Virus: NOT DETECTED
Rhinovirus / Enterovirus: NOT DETECTED

## 2017-10-14 LAB — BASIC METABOLIC PANEL
Anion gap: 13 (ref 5–15)
BUN: <5 mg/dL — ABNORMAL LOW (ref 6–20)
CALCIUM: 8.7 mg/dL — AB (ref 8.9–10.3)
CHLORIDE: 105 mmol/L (ref 98–111)
CO2: 21 mmol/L — ABNORMAL LOW (ref 22–32)
CREATININE: 0.68 mg/dL (ref 0.44–1.00)
Glucose, Bld: 111 mg/dL — ABNORMAL HIGH (ref 70–99)
Potassium: 3.5 mmol/L (ref 3.5–5.1)
SODIUM: 139 mmol/L (ref 135–145)

## 2017-10-14 LAB — EXPECTORATED SPUTUM ASSESSMENT W GRAM STAIN, RFLX TO RESP C

## 2017-10-14 LAB — LEGIONELLA PNEUMOPHILA SEROGP 1 UR AG: L. pneumophila Serogp 1 Ur Ag: NEGATIVE

## 2017-10-14 LAB — EXPECTORATED SPUTUM ASSESSMENT W REFEX TO RESP CULTURE

## 2017-10-14 MED ORDER — SODIUM CHLORIDE 0.9 % IV SOLN
3.0000 g | Freq: Four times a day (QID) | INTRAVENOUS | Status: DC
  Administered 2017-10-14 – 2017-10-19 (×20): 3 g via INTRAVENOUS
  Filled 2017-10-14 (×22): qty 3

## 2017-10-14 MED ORDER — LEVALBUTEROL HCL 1.25 MG/0.5ML IN NEBU
1.2500 mg | INHALATION_SOLUTION | Freq: Two times a day (BID) | RESPIRATORY_TRACT | Status: DC
  Administered 2017-10-14: 1.25 mg via RESPIRATORY_TRACT
  Filled 2017-10-14: qty 0.5

## 2017-10-14 MED ORDER — ALBUTEROL SULFATE (2.5 MG/3ML) 0.083% IN NEBU
2.5000 mg | INHALATION_SOLUTION | RESPIRATORY_TRACT | Status: DC | PRN
  Administered 2017-10-15 – 2017-10-16 (×2): 2.5 mg via RESPIRATORY_TRACT
  Filled 2017-10-14 (×2): qty 3

## 2017-10-14 MED ORDER — GUAIFENESIN ER 600 MG PO TB12
600.0000 mg | ORAL_TABLET | Freq: Two times a day (BID) | ORAL | Status: DC
  Administered 2017-10-14 – 2017-10-16 (×4): 600 mg via ORAL
  Filled 2017-10-14 (×4): qty 1

## 2017-10-14 MED ORDER — LEVALBUTEROL HCL 1.25 MG/0.5ML IN NEBU
1.2500 mg | INHALATION_SOLUTION | Freq: Four times a day (QID) | RESPIRATORY_TRACT | Status: DC
  Administered 2017-10-15 – 2017-10-16 (×6): 1.25 mg via RESPIRATORY_TRACT
  Filled 2017-10-14 (×6): qty 0.5

## 2017-10-14 NOTE — Progress Notes (Signed)
PROGRESS NOTE    Stephanie Young   AOZ:[id number]  DOB: 12-Dec-1990  DOA: 10/12/2017 PCP: Claiborne RiggFleming, [name], NP   Brief Narrative:  Stephanie Young is a 27 y/o with cough, dyspnea and chest pain who is found to have multifocal pneumonia on CT. seh was given Levofloxacin, Rocephin and Azithromycin and admitted for further mangement   Subjective: Coughing and bringing up loose sputum with is clear colored. No dyspnea.    Assessment & Plan:   Principal Problem:   Multifocal pneumonia/   Sepsis  - cont on Ceftriaxone and Azithromycin but still having high fevers - WBC 16.8 has improved to 13.4 - strep pneumo and legionella negative - as she is still having fevers, will check Resp virus panel - apparently she was having vomiting prior to admission as well and aspiration pneumonia may need considered in the differential - will change Ceftriaxone to Unasyn  Active Problems:   Hypokalemia  - replace     DVT prophylaxis: Lovenox Code Status: Full code Family Communication:  Disposition Plan: home Consultants:   none Procedures:   none Antimicrobials:  Anti-infectives (From admission, onward)   Start     Dose/Rate Route Frequency Ordered Stop   10/13/17 2000  levofloxacin (LEVAQUIN) IVPB 750 mg  Status:  Discontinued     750 mg 100 mL/hr over 90 Minutes Intravenous  Once 10/13/17 0257 10/13/17 0752   10/13/17 2000  levofloxacin (LEVAQUIN) IVPB 750 mg  Status:  Discontinued     750 mg 100 mL/hr over 90 Minutes Intravenous Every 24 hours 10/13/17 0752 10/13/17 0817   10/13/17 2000  azithromycin (ZITHROMAX) 500 mg in sodium chloride 0.9 % 250 mL IVPB     500 mg 250 mL/hr over 60 Minutes Intravenous Every 24 hours 10/13/17 0817     10/13/17 1300  cefTRIAXone (ROCEPHIN) 1 g in sodium chloride 0.9 % 100 mL IVPB     1 g 200 mL/hr over 30 Minutes Intravenous Every 24 hours 10/13/17 1258     10/13/17 1000  cefTRIAXone (ROCEPHIN) 1 g in sodium chloride 0.9 % 100 mL IVPB  Status:   Discontinued     1 g 200 mL/hr over 30 Minutes Intravenous Every 24 hours 10/13/17 0817 10/13/17 1259   10/12/17 2030  levofloxacin (LEVAQUIN) IVPB 750 mg     750 mg 100 mL/hr over 90 Minutes Intravenous  Once 10/12/17 2017 10/12/17 2252       Objective: Vitals:   10/14/17 0008 10/14/17 0246 10/14/17 0724 10/14/17 0728  BP: 112/75  118/61   Pulse: 91  (!) 115   Resp: 20  18   Temp: 98.6 F (37 C)  (!) 102.4 F (39.1 C)   TempSrc: Oral  Oral   SpO2: 94% 91% 92% 90%  Weight:      Height:        Intake/Output Summary (Last 24 hours) at 10/14/2017 1513 Last data filed at 10/14/2017 0800 Gross per 24 hour  Intake 4227.99 ml  Output 400 ml  Net 3827.99 ml   Filed Weights   10/12/17 1639 10/13/17 1600  Weight: 71.2 kg 71.2 kg    Examination: General exam: Appears comfortable  HEENT: PERRLA, oral mucosa moist, no sclera icterus or thrush Respiratory system: not able to take deep breaths. No crackles on exam but poor air movement. Has cough.  Cardiovascular system: S1 & S2 heard, RRR.   Gastrointestinal system: Abdomen soft, non-tender, nondistended. Normal bowel sound. No organomegaly Central nervous system: Alert and oriented. No  focal neurological deficits. Extremities: No cyanosis, clubbing or edema Skin: No rashes or ulcers Psychiatry:  Mood & affect appropriate.     Data Reviewed: I have personally reviewed following labs and imaging studies  CBC: Recent Labs  Lab 10/12/17 1643 10/14/17 0433  WBC 16.8* 13.4*  NEUTROABS 16.0*  --   HGB 11.4* 9.9*  HCT 35.6* 30.9*  MCV 86.6 87.0  PLT 398 342   Basic Metabolic Panel: Recent Labs  Lab 10/12/17 1643 10/14/17 0433  NA 135 139  K 3.3* 3.5  CL 101 105  CO2 21* 21*  GLUCOSE 109* 111*  BUN 6 <5*  CREATININE 0.60 0.68  CALCIUM 9.3 8.7*   GFR: Estimated Creatinine Clearance: 105.5 mL/min (by C-G formula based on SCr of 0.68 mg/dL). Liver Function Tests: Recent Labs  Lab 10/12/17 1911  AST 22  ALT 17    ALKPHOS 66  BILITOT 1.4*  PROT 8.0  ALBUMIN 3.7   Recent Labs  Lab 10/12/17 1911  LIPASE 27   No results for input(s): AMMONIA in the last 168 hours. Coagulation Profile: No results for input(s): INR, PROTIME in the last 168 hours. Cardiac Enzymes: No results for input(s): CKTOTAL, CKMB, CKMBINDEX, TROPONINI in the last 168 hours. BNP (last 3 results) No results for input(s): PROBNP in the last 8760 hours. HbA1C: No results for input(s): HGBA1C in the last 72 hours. CBG: No results for input(s): GLUCAP in the last 168 hours. Lipid Profile: No results for input(s): CHOL, HDL, LDLCALC, TRIG, CHOLHDL, LDLDIRECT in the last 72 hours. Thyroid Function Tests: No results for input(s): TSH, T4TOTAL, FREET4, T3FREE, THYROIDAB in the last 72 hours. Anemia Panel: No results for input(s): VITAMINB12, FOLATE, FERRITIN, TIBC, IRON, RETICCTPCT in the last 72 hours. Urine analysis:    Component Value Date/Time   COLORURINE YELLOW 10/13/2017 2051   APPEARANCEUR CLEAR 10/13/2017 2051   LABSPEC 1.011 10/13/2017 2051   PHURINE 5.0 10/13/2017 2051   GLUCOSEU NEGATIVE 10/13/2017 2051   HGBUR SMALL (A) 10/13/2017 2051   BILIRUBINUR NEGATIVE 10/13/2017 2051   BILIRUBINUR small 02/25/2017 1428   KETONESUR 20 (A) 10/13/2017 2051   PROTEINUR NEGATIVE 10/13/2017 2051   UROBILINOGEN 0.2 02/25/2017 1428   NITRITE NEGATIVE 10/13/2017 2051   LEUKOCYTESUR NEGATIVE 10/13/2017 2051   Sepsis Labs: @LABRCNTIP (procalcitonin:4,lacticidven:4) ) Recent Results (from the past 240 hour(s))  Blood culture (routine x 2)     Status: None (Preliminary result)   Collection Time: 10/12/17  8:20 PM  Result Value Ref Range Status   Specimen Description BLOOD RIGHT ANTECUBITAL  Final   Special Requests   Final    BOTTLES DRAWN AEROBIC AND ANAEROBIC Blood Culture adequate volume   Culture   Final    NO GROWTH 2 DAYS Performed at [facility], [address], [place], [place] [address]    Report Status  PENDING  Incomplete  Blood culture (routine x 2)     Status: None (Preliminary result)   Collection Time: 10/12/17  8:37 PM  Result Value Ref Range Status   Specimen Description BLOOD SITE NOT SPECIFIED  Final   Special Requests   Final    BOTTLES DRAWN AEROBIC AND ANAEROBIC Blood Culture results may not be optimal due to an excessive volume of blood received in culture bottles   Culture   Final    NO GROWTH 2 DAYS Performed at [facility], [address] [place], [place] [address]    Report Status PENDING  Incomplete  Respiratory Panel  by PCR     Status: None   Collection Time: 10/13/17  8:40 AM  Result Value Ref Range Status   Adenovirus NOT DETECTED NOT DETECTED Final   Coronavirus 229E NOT DETECTED NOT DETECTED Final   Coronavirus HKU1 NOT DETECTED NOT DETECTED Final   Coronavirus NL63 NOT DETECTED NOT DETECTED Final   Coronavirus OC43 NOT DETECTED NOT DETECTED Final   Metapneumovirus NOT DETECTED NOT DETECTED Final   Rhinovirus / Enterovirus NOT DETECTED NOT DETECTED Final   Influenza A NOT DETECTED NOT DETECTED Final   Influenza B NOT DETECTED NOT DETECTED Final   Parainfluenza Virus 1 NOT DETECTED NOT DETECTED Final   Parainfluenza Virus 2 NOT DETECTED NOT DETECTED Final   Parainfluenza Virus 3 NOT DETECTED NOT DETECTED Final   Parainfluenza Virus 4 NOT DETECTED NOT DETECTED Final   Respiratory Syncytial Virus NOT DETECTED NOT DETECTED Final   Bordetella pertussis NOT DETECTED NOT DETECTED Final   Chlamydophila pneumoniae NOT DETECTED NOT DETECTED Final   Mycoplasma pneumoniae NOT DETECTED NOT DETECTED Final    Comment: Performed at [facility], [address], [place], [place] [address]         Radiology Studies: Dg Chest 2 View  Result Date: 10/12/2017 CLINICAL DATA:  Shortness of breath and productive cough for 4 days. EXAM: CHEST - 2 VIEW COMPARISON:  None. FINDINGS: The heart size and mediastinal contours are within normal limits. There is no focal  infiltrate, pulmonary edema, or pleural effusion. The visualized skeletal structures are unremarkable. IMPRESSION: No active cardiopulmonary disease. Electronically Signed   By: Sherian Rein M.D.   On: 10/12/2017 15:27   Ct Angio Chest Pe W/cm &/or Wo Cm  Result Date: 10/12/2017 CLINICAL DATA:  Shortness of breath and cough for 4 days. EXAM: CT ANGIOGRAPHY CHEST WITH CONTRAST TECHNIQUE: Multidetector CT imaging of the chest was performed using the standard protocol during bolus administration of intravenous contrast. Multiplanar CT image reconstructions and MIPs were obtained to evaluate the vascular anatomy. CONTRAST:  ISOVUE-370 IOPAMIDOL (ISOVUE-370) INJECTION 76% COMPARISON:  Chest radiograph October 12, 2017 FINDINGS: CARDIOVASCULAR: Suboptimal contrast opacification of the pulmonary artery's (main pulmonary artery is 120 Hounsfield units, target is 250 Hounsfield units). Main pulmonary artery is not enlarged. No pulmonary arterial filling defects to the level of the lobar branches. Admixture of contrast and respiratory motion limits sensitivity for segmental and subsegmental emboli heart size is normal. No pericardial effusion. Thoracic aorta is normal course and caliber, unremarkable. MEDIASTINUM/NODES: No lymphadenopathy by CT size criteria. LUNGS/PLEURA: Tracheobronchial tree is patent, no pneumothorax. Multifocal dense consolidation bilateral lungs, confident in the lower lobes. No pleural effusion. UPPER ABDOMEN: Included view of the abdomen is unremarkable. MUSCULOSKELETAL: Visualized soft tissues and included osseous structures appear normal. Review of the MIP images confirms the above findings. IMPRESSION: 1. No acute central pulmonary embolus. 2. Multifocal pneumonia. Electronically Signed   By: Awilda Metro M.D.   On: 10/12/2017 19:53      Scheduled Meds: . cholestyramine light  4 g Oral QID  . enoxaparin (LOVENOX) injection  40 mg Subcutaneous Daily  . guaiFENesin  1,200 mg Oral  BID  . levalbuterol  1.25 mg Nebulization BID   Continuous Infusions: . sodium chloride 150 mL/hr at 10/14/17 0400  . azithromycin 500 mg (10/13/17 2005)  . cefTRIAXone (ROCEPHIN)  IV 1 g (10/14/17 1312)     LOS: 0 days    Time spent in minutes: 35    Calvert Cantor, MD [facility] Pager:  [email or web address] Password [email or web address] 10/14/2017, 3:13 PM

## 2017-10-14 NOTE — Progress Notes (Signed)
ANTIBIOTIC CONSULT NOTE - INITIAL  Pharmacy Consult for Ampicillin/Sulbactam Indication: Aspiration PNA  No Known Allergies  Patient Measurements: Height: 5\' 5"  (165.1 cm) Weight: 156 lb 15.5 oz (71.2 kg) IBW/kg (Calculated) : 57   Vital Signs: Temp: 102.4 F (39.1 C) (08/09 0724) Temp Source: Oral (08/09 0724) BP: 118/61 (08/09 0724) Pulse Rate: 115 (08/09 0724) Intake/Output from previous day: 08/08 0701 - 08/09 0700 In: 3328 [P.O.:700; I.V.:2278; IV Piggyback:350] Out: 400 [Urine:400] Intake/Output from this shift: Total I/O In: 900 [P.O.:300; I.V.:600] Out: -   Labs: Recent Labs    10/12/17 1643 10/14/17 0433  WBC 16.8* 13.4*  HGB 11.4* 9.9*  PLT 398 342  CREATININE 0.60 0.68   Estimated Creatinine Clearance: 105.5 mL/min (by C-G formula based on SCr of 0.68 mg/dL). No results for input(s): VANCOTROUGH, VANCOPEAK, VANCORANDOM, GENTTROUGH, GENTPEAK, GENTRANDOM, TOBRATROUGH, TOBRAPEAK, TOBRARND, AMIKACINPEAK, AMIKACINTROU, AMIKACIN in the last 72 hours.   Microbiology: Recent Results (from the past 720 hour(s))  Blood culture (routine x 2)     Status: None (Preliminary result)   Collection Time: 10/12/17  8:20 PM  Result Value Ref Range Status   Specimen Description BLOOD RIGHT ANTECUBITAL  Final   Special Requests   Final    BOTTLES DRAWN AEROBIC AND ANAEROBIC Blood Culture adequate volume   Culture   Final    NO GROWTH 2 DAYS Performed at [facility], [address] [place], [place] [address]    Report Status PENDING  Incomplete  Blood culture (routine x 2)     Status: None (Preliminary result)   Collection Time: 10/12/17  8:37 PM  Result Value Ref Range Status   Specimen Description BLOOD SITE NOT SPECIFIED  Final   Special Requests   Final    BOTTLES DRAWN AEROBIC AND ANAEROBIC Blood Culture results may not be optimal due to an excessive volume of blood received in culture bottles   Culture   Final    NO GROWTH 2 DAYS Performed at [facility], [address] [place], [place] [address]    Report Status PENDING  Incomplete  Respiratory Panel by PCR     Status: None   Collection Time: 10/13/17  8:40 AM  Result Value Ref Range Status   Adenovirus NOT DETECTED NOT DETECTED Final   Coronavirus 229E NOT DETECTED NOT DETECTED Final   Coronavirus HKU1 NOT DETECTED NOT DETECTED Final   Coronavirus NL63 NOT DETECTED NOT DETECTED Final   Coronavirus OC43 NOT DETECTED NOT DETECTED Final   Metapneumovirus NOT DETECTED NOT DETECTED Final   Rhinovirus / Enterovirus NOT DETECTED NOT DETECTED Final   Influenza A NOT DETECTED NOT DETECTED Final   Influenza B NOT DETECTED NOT DETECTED Final   Parainfluenza Virus 1 NOT DETECTED NOT DETECTED Final   Parainfluenza Virus 2 NOT DETECTED NOT DETECTED Final   Parainfluenza Virus 3 NOT DETECTED NOT DETECTED Final   Parainfluenza Virus 4 NOT DETECTED NOT DETECTED Final   Respiratory Syncytial Virus NOT DETECTED NOT DETECTED Final   Bordetella pertussis NOT DETECTED NOT DETECTED Final   Chlamydophila pneumoniae NOT DETECTED NOT DETECTED Final   Mycoplasma pneumoniae NOT DETECTED NOT DETECTED Final    Comment: Performed at [facility], [address] [place], [place] [address]    Medical History: Past Medical History:  Diagnosis Date  . H. pylori infection   . Kidney stones      Assessment: 27 yo female to be treated for aspiration PNA. Pharmacy asked to  dose ampicillin/sulbactam.   Plan:  Unasyn 3gm IV q6h Monitor clinical course, LOT, renal function, and fever curve.   Glenna Brunkow A. Jeanella Craze, PharmD, BCPS Clinical Pharmacist [place] Pager: [phone] Please utilize Amion for appropriate phone number to reach the unit pharmacist [facility])   10/14/2017,3:22 PM

## 2017-10-15 ENCOUNTER — Inpatient Hospital Stay (HOSPITAL_COMMUNITY): Payer: Self-pay

## 2017-10-15 DIAGNOSIS — R112 Nausea with vomiting, unspecified: Secondary | ICD-10-CM

## 2017-10-15 DIAGNOSIS — R5081 Fever presenting with conditions classified elsewhere: Secondary | ICD-10-CM

## 2017-10-15 LAB — CBC
HCT: 29.9 % — ABNORMAL LOW (ref 36.0–46.0)
Hemoglobin: 9.4 g/dL — ABNORMAL LOW (ref 12.0–15.0)
MCH: 27.4 pg (ref 26.0–34.0)
MCHC: 31.4 g/dL (ref 30.0–36.0)
MCV: 87.2 fL (ref 78.0–100.0)
PLATELETS: 355 10*3/uL (ref 150–400)
RBC: 3.43 MIL/uL — AB (ref 3.87–5.11)
RDW: 13.8 % (ref 11.5–15.5)
WBC: 16.6 10*3/uL — ABNORMAL HIGH (ref 4.0–10.5)

## 2017-10-15 LAB — MRSA PCR SCREENING: MRSA BY PCR: NEGATIVE

## 2017-10-15 MED ORDER — IBUPROFEN 200 MG PO TABS
400.0000 mg | ORAL_TABLET | Freq: Once | ORAL | Status: AC
  Administered 2017-10-16: 400 mg via ORAL
  Filled 2017-10-15: qty 2

## 2017-10-15 MED ORDER — VANCOMYCIN HCL IN DEXTROSE 1-5 GM/200ML-% IV SOLN
1000.0000 mg | Freq: Two times a day (BID) | INTRAVENOUS | Status: DC
  Administered 2017-10-16: 1000 mg via INTRAVENOUS
  Filled 2017-10-15 (×2): qty 200

## 2017-10-15 MED ORDER — VANCOMYCIN HCL 10 G IV SOLR
1500.0000 mg | Freq: Once | INTRAVENOUS | Status: AC
  Administered 2017-10-15: 1500 mg via INTRAVENOUS
  Filled 2017-10-15: qty 1500

## 2017-10-15 MED ORDER — IBUPROFEN 200 MG PO TABS
400.0000 mg | ORAL_TABLET | Freq: Once | ORAL | Status: AC
  Administered 2017-10-15: 400 mg via ORAL
  Filled 2017-10-15: qty 2

## 2017-10-15 MED ORDER — HYDROCOD POLST-CPM POLST ER 10-8 MG/5ML PO SUER
5.0000 mL | Freq: Two times a day (BID) | ORAL | Status: DC
  Administered 2017-10-15 – 2017-10-16 (×3): 5 mL via ORAL
  Filled 2017-10-15 (×4): qty 5

## 2017-10-15 MED ORDER — LEVOFLOXACIN IN D5W 750 MG/150ML IV SOLN: 750.0000 mg | INTRAVENOUS | Status: DC

## 2017-10-15 MED ORDER — VANCOMYCIN HCL IN DEXTROSE 1-5 GM/200ML-% IV SOLN: 1000.0000 mg | Freq: Two times a day (BID) | INTRAVENOUS | Status: DC

## 2017-10-15 MED ORDER — SODIUM CHLORIDE 0.9 % IV BOLUS
500.0000 mL | Freq: Once | INTRAVENOUS | Status: AC
  Administered 2017-10-16: 500 mL via INTRAVENOUS

## 2017-10-15 NOTE — Consult Note (Signed)
  [facility]  Total days of antibiotics 4        Day 2 amp/sub       Reason for Consult:pneumonia    Referring Physician: rizwan  Principal Problem:   Multifocal pneumonia Active Problems:   Hypokalemia   Sepsis (HCC)   Lobar pneumonia (HCC)    HPI: Stephanie Young is a 27 y.o. 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 She was admitted on 8/7 for bilateral patchy pneumonia, with leukocytosis ,and fevers. Also complained of chest pain is located in the frontal chest, sharp, mild, nonradiating, pleuritic, aggravated by deep breath and coughing. On admit, she has leukocytosis of 16.8K with 95% N, and temp of 102.8F. Initially started on levo  X 1 dose -> ceftriaxone plus azithromycin  X 2 d-> but patient still having ongoing fevers, and elevated WBC.-> she was changed to amp/sub. CXR suggests mild worsening of bilateral infiltrates per my read. RVP negative, strep pneumo ur ag neg, as well as ur legionella neg.blood cx neg. She reports fevers mostly at night. She notices some improvement with albuterol nebulizers. She is coughing up think pinkish secretions occasionally.  May have had sick contact exposure at work from patients. Doesn't think she had cold prior to onset of these symptoms  Past Medical History:  Diagnosis Date  . H. pylori infection   . Kidney stones     Allergies: No Known Allergies  MEDICATIONS: . enoxaparin (LOVENOX) injection  40 mg Subcutaneous Daily  . guaiFENesin  600 mg Oral BID  . levalbuterol  1.25 mg Nebulization Q6H    Social History   Tobacco Use  . Smoking status: Former Smoker  . Smokeless tobacco: Never Used  Substance Use Topics  . Alcohol use: Yes    Comment: socially   . Drug use: Yes    Types: Marijuana   Comment: socially    Family History  Problem Relation Age of Onset  . Breast cancer Mother 64  . Hypertension Mother   . Hypertension Brother   . Diabetes Maternal Uncle   . Diabetes Other   . Colon cancer Neg Hx   . Colon polyps Neg Hx      Review of Systems  Constitutional: positive for fever, chills, diaphoresis, activity change, appetite change, fatigue and unexpected weight change.  HENT: Negative for congestion, sore throat, rhinorrhea, sneezing, trouble swallowing and sinus pressure.  Eyes: Negative for photophobia and visual disturbance.  Respiratory: positive for cough, chest tightness, shortness of breath, wheezing and stridor.  Cardiovascular: Negative for chest pain, palpitations and leg swelling.  Gastrointestinal: Negative for nausea, vomiting, abdominal pain, diarrhea, constipation, blood in stool, abdominal distention and anal bleeding.  Genitourinary: Negative for dysuria, hematuria, flank pain and difficulty urinating.  Musculoskeletal: Negative for myalgias, back pain, joint swelling, arthralgias and gait problem.  Skin: Negative for color change, pallor, rash and wound.  Neurological: Negative for dizziness, tremors, weakness and light-headedness.  Hematological: Negative for adenopathy. Does not bruise/bleed easily.  Psychiatric/Behavioral: Negative for behavioral problems, confusion, sleep disturbance, dysphoric mood, decreased concentration and agitation.     OBJECTIVE: Temp:  [98.9 F (37.2 C)-103 F (39.4 C)] 99.7 F (37.6 C) (08/10 0745) Pulse Rate:  [98-114] 104 (08/10 0745) Resp:  [18-24] 24 (08/10 0745) BP: (117-135)/(67-78)   117/67 (08/10 0745) SpO2:  [90 %-96 %] 96 % (08/10 0919) Physical Exam  Constitutional:  oriented to person, place, and time. appears well-developed and well-nourished. No distress.  HENT: [place]/AT, PERRLA, no scleral icterus Mouth/Throat: Oropharynx is clear and moist. No oropharyngeal exudate.  Cardiovascular: Normal rate,  regular rhythm and normal heart sounds. Exam reveals no gallop and no friction rub.  No murmur heard.  Pulmonary/Chest: Effort normal and breath sounds normal. No respiratory distress.  has no wheezes.  Neck = supple, no nuchal rigidity, + submandibular LN Abdominal: Soft. Bowel sounds are normal.  exhibits no distension. There is no tenderness.  Neurological: alert and oriented to person, place, and time.  Skin: Skin is warm and dry. No rash noted. No erythema.  Psychiatric: a normal mood and affect.  behavior is normal.    LABS: Results for orders placed or performed during the hospital encounter of 10/12/17 (from the past 48 hour(s))  Urinalysis, Routine w reflex microscopic     Status: Abnormal   Collection Time: 10/13/17  8:51 PM  Result Value Ref Range   Color, Urine YELLOW YELLOW   APPearance CLEAR CLEAR   Specific Gravity, Urine 1.011 1.005 - 1.030   pH 5.0 5.0 - 8.0   Glucose, UA NEGATIVE NEGATIVE mg/dL   Hgb urine dipstick SMALL (A) NEGATIVE   Bilirubin Urine NEGATIVE NEGATIVE   Ketones, ur 20 (A) NEGATIVE mg/dL   Protein, ur NEGATIVE NEGATIVE mg/dL   Nitrite NEGATIVE NEGATIVE   Leukocytes, UA NEGATIVE NEGATIVE   RBC / HPF 0-5 0 - 5 RBC/hpf   WBC, UA 0-5 0 - 5 WBC/hpf   Bacteria, UA NONE SEEN NONE SEEN   Squamous Epithelial / LPF 0-5 0 - 5   Mucus PRESENT     Comment: Performed at [facility], [address] [place], [place] [address]  CBC     Status: Abnormal   Collection Time: 10/14/17  4:33 AM  Result Value Ref Range   WBC 13.4 (H) 4.0 - 10.5 K/uL   RBC 3.55 (L) 3.87 - 5.11 MIL/uL   Hemoglobin 9.9 (L) 12.0 - 15.0 g/dL   HCT 30.9 (L) 36.0 - 46.0 %   MCV 87.0 78.0 - 100.0 fL   MCH 27.9 26.0 - 34.0 pg   MCHC 32.0 30.0 - 36.0 g/dL   RDW 13.5 11.5 - 15.5 %   Platelets 342 150 - 400 K/uL    Comment: Performed at [facility], [address] [place], [place] [address]  Basic metabolic panel     Status: Abnormal   Collection Time: 10/14/17  4:33 AM    Result Value Ref Range   Sodium 139 135 - 145 mmol/L   Potassium 3.5 3.5 - 5.1 mmol/L   Chloride 105 98 - 111 mmol/L   CO2 21 (L) 22 - 32 mmol/L   Glucose, Bld 111 (H) 70 - 99 mg/dL   BUN <5 (L) 6 - 20 mg/dL   Creatinine, Ser 0.68 0.44 - 1.00 mg/dL   Calcium 8.7 (L) 8.9 - 10.3 mg/dL   GFR calc non Af Amer >60 >60 mL/min   GFR calc Af Amer >60 >60 mL/min    Comment: (NOTE) The eGFR has been calculated using the CKD EPI equation. This calculation has not been validated in all clinical situations. eGFR's persistently <60 mL/min signify possible Chronic Kidney Disease.    Anion gap 13 5 - 15    Comment: Performed at [facility],   [address] [place], [place] [address]  Culture, sputum-assessment     Status: None   Collection Time: 10/14/17 12:30 PM  Result Value Ref Range   Specimen Description EXPECTORATED SPUTUM    Special Requests NONE    Sputum evaluation      Sputum specimen not acceptable for testing.  Please recollect.   Results Called to: .C. OKEEFE, RN AT 1616 ON 10/14/17 BY C. JESSUP, MLT. Performed at [facility], [place] [address], [place], [place] [address]    Report Status 10/14/2017 FINAL   Respiratory Panel by PCR     Status: None   Collection Time: 10/14/17  3:59 PM  Result Value Ref Range   Adenovirus NOT DETECTED NOT DETECTED   Coronavirus 229E NOT DETECTED NOT DETECTED   Coronavirus HKU1 NOT DETECTED NOT DETECTED   Coronavirus NL63 NOT DETECTED NOT DETECTED   Coronavirus OC43 NOT DETECTED NOT DETECTED   Metapneumovirus NOT DETECTED NOT DETECTED   Rhinovirus / Enterovirus NOT DETECTED NOT DETECTED   Influenza A NOT DETECTED NOT DETECTED   Influenza B NOT DETECTED NOT DETECTED   Parainfluenza Virus 1 NOT DETECTED NOT DETECTED   Parainfluenza Virus 2 NOT DETECTED NOT DETECTED   Parainfluenza Virus 3 NOT DETECTED NOT DETECTED   Parainfluenza Virus 4 NOT DETECTED NOT DETECTED   Respiratory Syncytial Virus NOT DETECTED NOT DETECTED   Bordetella  pertussis NOT DETECTED NOT DETECTED   Chlamydophila pneumoniae NOT DETECTED NOT DETECTED   Mycoplasma pneumoniae NOT DETECTED NOT DETECTED    Comment: Performed at [place] [address] [place], [place] [address]  CBC     Status: Abnormal   Collection Time: 10/15/17  4:14 AM  Result Value Ref Range   WBC 16.6 (H) 4.0 - 10.5 K/uL   RBC 3.43 (L) 3.87 - 5.11 MIL/uL   Hemoglobin 9.4 (L) 12.0 - 15.0 g/dL   HCT 29.9 (L) 36.0 - 46.0 %   MCV 87.2 78.0 - 100.0 fL   MCH 27.4 26.0 - 34.0 pg   MCHC 31.4 30.0 - 36.0 g/dL   RDW 13.8 11.5 - 15.5 %   Platelets 355 150 - 400 K/uL    Comment: Performed at [facility], [place] [address], [place], [place] [address]    MICRO: Reviewed, results listed above IMAGING: Dg Chest 2 View  Result Date: 10/15/2017 CLINICAL DATA:  Fever and cough. EXAM: CHEST - 2 VIEW COMPARISON:  October 12, 2017 FINDINGS: The cardiomediastinal silhouette is stable. No pneumothorax. Bilateral patchy pulmonary infiltrates suggest multifocal pneumonia. No other acute abnormalities. IMPRESSION: Multifocal pneumonia, worse on today's chest x-ray compared to the October 12, 2017 x-ray. Electronically Signed   By: Dorise Bullion III M.D   On: 10/15/2017 08:44    Assessment/Plan:  [id number] F with multifocal pneumonia not responding to CAP coverage. Concern that clinical picture c/w MRSA pneumonia - will add vancomycin - continue with amp/sub - will d/c azithromycin  Fevers = likely from pneumonia, will see if any improvement with the addition of vancomycin

## 2017-10-15 NOTE — Progress Notes (Addendum)
PROGRESS NOTE    Stephanie SchneidersJuliana Young   ZOX:[id number]  DOB: [birth year]  DOA: 10/12/2017 PCP: Claiborne RiggFleming, Stephanie W, NP   Brief Narrative:  Stephanie SchneidersJuliana Young is a 27 y/o with cough, dyspnea and chest pain who is found to have multifocal pneumonia on CT. She was given Levofloxacin, Rocephin and Azithromycin and admitted for further management. Symptoms of cough started about 3-4 days prior to admission. She also had 3 episodes of vomiting on the day of admission.   Subjective: Still has cough and fevers. No significant change in symptoms. Called by RN that the patient is coughing to the point of vomiting.     Assessment & Plan:   Principal Problem:   Multifocal pneumonia/   Sepsis  - WBC 16.8 has improved to 13.4 but back up to 16.6 today - strep pneumo and legionella negative - HIV neg and Resp virus panel neg - apparently she was having vomiting prior to admission as well and aspiration pneumonia may need considered in the differential - Ceftriaxone switched to Unasyn on 8/9  - cont Unasyn and Azithromycin -she denies smoking and drug use - as she continues to have high fevers, I have asked ID for an opinion - add Tussionex for severe cough  Active Problems:   Hypokalemia  - replaced     DVT prophylaxis: Lovenox Code Status: Full code Family Communication:  Disposition Plan: home Consultants:   none Procedures:   none Antimicrobials:  Anti-infectives (From admission, onward)   Start     Dose/Rate Route Frequency Ordered Stop   10/16/17 0000  vancomycin (VANCOCIN) IVPB 1000 mg/200 mL premix     1,000 mg 200 mL/hr over 60 Minutes Intravenous Every 12 hours 10/15/17 1132     10/15/17 1200  vancomycin (VANCOCIN) 1,500 mg in sodium chloride 0.9 % 500 mL IVPB     1,500 mg 250 mL/hr over 120 Minutes Intravenous  Once 10/15/17 1132     10/15/17 1000  levofloxacin (LEVAQUIN) IVPB 750 mg  Status:  Discontinued     750 mg 100 mL/hr over 90 Minutes Intravenous Every 24 hours  10/15/17 0954 10/15/17 1001   10/14/17 1530  Ampicillin-Sulbactam (UNASYN) 3 g in sodium chloride 0.9 % 100 mL IVPB     3 g 200 mL/hr over 30 Minutes Intravenous Every 6 hours 10/14/17 1525     10/13/17 2000  levofloxacin (LEVAQUIN) IVPB 750 mg  Status:  Discontinued     750 mg 100 mL/hr over 90 Minutes Intravenous  Once 10/13/17 0257 10/13/17 0752   10/13/17 2000  levofloxacin (LEVAQUIN) IVPB 750 mg  Status:  Discontinued     750 mg 100 mL/hr over 90 Minutes Intravenous Every 24 hours 10/13/17 0752 10/13/17 0817   10/13/17 2000  azithromycin (ZITHROMAX) 500 mg in sodium chloride 0.9 % 250 mL IVPB     500 mg 250 mL/hr over 60 Minutes Intravenous Every 24 hours 10/13/17 0817     10/13/17 1300  cefTRIAXone (ROCEPHIN) 1 g in sodium chloride 0.9 % 100 mL IVPB  Status:  Discontinued     1 g 200 mL/hr over 30 Minutes Intravenous Every 24 hours 10/13/17 1258 10/14/17 1520   10/13/17 1000  cefTRIAXone (ROCEPHIN) 1 g in sodium chloride 0.9 % 100 mL IVPB  Status:  Discontinued     1 g 200 mL/hr over 30 Minutes Intravenous Every 24 hours 10/13/17 0817 10/13/17 1259   10/12/17 2030  levofloxacin (LEVAQUIN) IVPB 750 mg     750 mg 100 mL/hr  over 90 Minutes Intravenous  Once 10/12/17 2017 10/12/17 2252       Objective: Vitals:   10/15/17 0745 10/15/17 0919 10/15/17 1000 10/15/17 1100  BP: 117/67     Pulse: (!) 104     Resp: (!) 24     Temp: 99.7 F (37.6 C)     TempSrc: Oral     SpO2: 91% 96% (!) 84% 95%  Weight:      Height:        Intake/Output Summary (Last 24 hours) at 10/15/2017 1424 Last data filed at 10/15/2017 0800 Gross per 24 hour  Intake 3996.5 ml  Output -  Net 3996.5 ml   Filed Weights   10/12/17 1639 10/13/17 1600  Weight: 71.2 kg 71.2 kg    Examination: General exam: Appears comfortable  HEENT: PERRLA, oral mucosa moist, no sclera icterus or thrush Respiratory system: not able to take deep breaths. No crackles on exam but poor air movement. Has cough.    Cardiovascular system: S1 & S2 heard, RRR.   Gastrointestinal system: Abdomen soft, non-tender, nondistended. Normal bowel sound. No organomegaly Central nervous system: Alert and oriented. No focal neurological deficits. Extremities: No cyanosis, clubbing or edema Skin: No rashes or ulcers Psychiatry:  Mood & affect appropriate.     Data Reviewed: I have personally reviewed following labs and imaging studies  CBC: Recent Labs  Lab 10/12/17 1643 10/14/17 0433 10/15/17 0414  WBC 16.8* 13.4* 16.6*  NEUTROABS 16.0*  --   --   HGB 11.4* 9.9* 9.4*  HCT 35.6* 30.9* 29.9*  MCV 86.6 87.0 87.2  PLT 398 342 355   Basic Metabolic Panel: Recent Labs  Lab 10/12/17 1643 10/14/17 0433  NA 135 139  K 3.3* 3.5  CL 101 105  CO2 21* 21*  GLUCOSE 109* 111*  BUN 6 <5*  CREATININE 0.60 0.68  CALCIUM 9.3 8.7*   GFR: Estimated Creatinine Clearance: 105.5 mL/min (by C-G formula based on SCr of 0.68 mg/dL). Liver Function Tests: Recent Labs  Lab 10/12/17 1911  AST 22  ALT 17  ALKPHOS 66  BILITOT 1.4*  PROT 8.0  ALBUMIN 3.7   Recent Labs  Lab 10/12/17 1911  LIPASE 27   No results for input(s): AMMONIA in the last 168 hours. Coagulation Profile: No results for input(s): INR, PROTIME in the last 168 hours. Cardiac Enzymes: No results for input(s): CKTOTAL, CKMB, CKMBINDEX, TROPONINI in the last 168 hours. BNP (last 3 results) No results for input(s): PROBNP in the last 8760 hours. HbA1C: No results for input(s): HGBA1C in the last 72 hours. CBG: No results for input(s): GLUCAP in the last 168 hours. Lipid Profile: No results for input(s): CHOL, HDL, LDLCALC, TRIG, CHOLHDL, LDLDIRECT in the last 72 hours. Thyroid Function Tests: No results for input(s): TSH, T4TOTAL, FREET4, T3FREE, THYROIDAB in the last 72 hours. Anemia Panel: No results for input(s): VITAMINB12, FOLATE, FERRITIN, TIBC, IRON, RETICCTPCT in the last 72 hours. Urine analysis:    Component Value Date/Time    COLORURINE YELLOW 10/13/2017 2051   APPEARANCEUR CLEAR 10/13/2017 2051   LABSPEC 1.011 10/13/2017 2051   PHURINE 5.0 10/13/2017 2051   GLUCOSEU NEGATIVE 10/13/2017 2051   HGBUR SMALL (A) 10/13/2017 2051   BILIRUBINUR NEGATIVE 10/13/2017 2051   BILIRUBINUR small 02/25/2017 1428   KETONESUR 20 (A) 10/13/2017 2051   PROTEINUR NEGATIVE 10/13/2017 2051   UROBILINOGEN 0.2 02/25/2017 1428   NITRITE NEGATIVE 10/13/2017 2051   LEUKOCYTESUR NEGATIVE 10/13/2017 2051   Sepsis Labs: @LABRCNTIP (procalcitonin:4,lacticidven:4) )  Recent Results (from the past 240 hour(s))  Blood culture (routine x 2)     Status: None (Preliminary result)   Collection Time: 10/12/17  8:20 PM  Result Value Ref Range Status   Specimen Description BLOOD RIGHT ANTECUBITAL  Final   Special Requests   Final    BOTTLES DRAWN AEROBIC AND ANAEROBIC Blood Culture adequate volume   Culture   Final    NO GROWTH 3 DAYS Performed at [facility], [address] [place], [place] [address]    Report Status PENDING  Incomplete  Blood culture (routine x 2)     Status: None (Preliminary result)   Collection Time: 10/12/17  8:37 PM  Result Value Ref Range Status   Specimen Description BLOOD SITE NOT SPECIFIED  Final   Special Requests   Final    BOTTLES DRAWN AEROBIC AND ANAEROBIC Blood Culture results may not be optimal due to an excessive volume of blood received in culture bottles   Culture   Final    NO GROWTH 3 DAYS Performed at [facility], [address], [place], [place] [address]    Report Status PENDING  Incomplete  Respiratory Panel by PCR     Status: None   Collection Time: 10/13/17  8:40 AM  Result Value Ref Range Status   Adenovirus NOT DETECTED NOT DETECTED Final   Coronavirus 229E NOT DETECTED NOT DETECTED Final   Coronavirus HKU1 NOT DETECTED NOT DETECTED Final   Coronavirus NL63 NOT DETECTED NOT DETECTED Final   Coronavirus OC43 NOT DETECTED NOT DETECTED Final   Metapneumovirus NOT  DETECTED NOT DETECTED Final   Rhinovirus / Enterovirus NOT DETECTED NOT DETECTED Final   Influenza A NOT DETECTED NOT DETECTED Final   Influenza B NOT DETECTED NOT DETECTED Final   Parainfluenza Virus 1 NOT DETECTED NOT DETECTED Final   Parainfluenza Virus 2 NOT DETECTED NOT DETECTED Final   Parainfluenza Virus 3 NOT DETECTED NOT DETECTED Final   Parainfluenza Virus 4 NOT DETECTED NOT DETECTED Final   Respiratory Syncytial Virus NOT DETECTED NOT DETECTED Final   Bordetella pertussis NOT DETECTED NOT DETECTED Final   Chlamydophila pneumoniae NOT DETECTED NOT DETECTED Final   Mycoplasma pneumoniae NOT DETECTED NOT DETECTED Final    Comment: Performed at [facility], [address], [place], [place] [address]  Culture, sputum-assessment     Status: None   Collection Time: 10/14/17 12:30 PM  Result Value Ref Range Status   Specimen Description EXPECTORATED SPUTUM  Final   Special Requests NONE  Final   Sputum evaluation   Final    Sputum specimen not acceptable for testing.  Please recollect.   Results Called to: .C. OKEEFE, RN AT 1616 ON 10/14/17 BY C. JESSUP, MLT. Performed at [facility] Lab, [address], [place], [place] [address]    Report Status 10/14/2017 FINAL  Final  Respiratory Panel by PCR     Status: None   Collection Time: 10/14/17  3:59 PM  Result Value Ref Range Status   Adenovirus NOT DETECTED NOT DETECTED Final   Coronavirus 229E NOT DETECTED NOT DETECTED Final   Coronavirus HKU1 NOT DETECTED NOT DETECTED Final   Coronavirus NL63 NOT DETECTED NOT DETECTED Final   Coronavirus OC43 NOT DETECTED NOT DETECTED Final   Metapneumovirus NOT DETECTED NOT DETECTED Final   Rhinovirus / Enterovirus NOT DETECTED NOT DETECTED Final   Influenza A NOT DETECTED NOT DETECTED Final   Influenza B NOT DETECTED NOT DETECTED  Final   Parainfluenza Virus 1 NOT DETECTED NOT DETECTED Final   Parainfluenza Virus 2 NOT DETECTED NOT DETECTED Final   Parainfluenza Virus 3 NOT DETECTED  NOT DETECTED Final   Parainfluenza Virus 4 NOT DETECTED NOT DETECTED Final   Respiratory Syncytial Virus NOT DETECTED NOT DETECTED Final   Bordetella pertussis NOT DETECTED NOT DETECTED Final   Chlamydophila pneumoniae NOT DETECTED NOT DETECTED Final   Mycoplasma pneumoniae NOT DETECTED NOT DETECTED Final    Comment: Performed at [facility], [address], [place], [place] [address]  MRSA PCR Screening     Status: None   Collection Time: 10/15/17 11:43 AM  Result Value Ref Range Status   MRSA by PCR NEGATIVE NEGATIVE Final    Comment:        The GeneXpert MRSA Assay (FDA approved for NASAL specimens only), is one component of a comprehensive MRSA colonization surveillance program. It is not intended to diagnose MRSA infection nor to guide or monitor treatment for MRSA infections. Performed at [facility], [address] [place], [place] [address]          Radiology Studies: Dg Chest 2 View  Result Date: 10/15/2017 CLINICAL DATA:  Fever and cough. EXAM: CHEST - 2 VIEW COMPARISON:  October 12, 2017 FINDINGS: The cardiomediastinal silhouette is stable. No pneumothorax. Bilateral patchy pulmonary infiltrates suggest multifocal pneumonia. No other acute abnormalities. IMPRESSION: Multifocal pneumonia, worse on today's chest x-ray compared to the October 12, 2017 x-ray. Electronically Signed   By: Gerome Sam III M.D   On: 10/15/2017 08:44      Scheduled Meds: . chlorpheniramine-HYDROcodone  5 mL Oral Q12H  . enoxaparin (LOVENOX) injection  40 mg Subcutaneous Daily  . guaiFENesin  600 mg Oral BID  . levalbuterol  1.25 mg Nebulization Q6H   Continuous Infusions: . sodium chloride 150 mL/hr at 10/15/17 0402  . ampicillin-sulbactam (UNASYN) IV 3 g (10/15/17 0859)  . azithromycin 500 mg (10/14/17 2053)  . vancomycin 1,500 mg (10/15/17 1232)  . [START ON 10/16/2017] vancomycin       LOS: 1 day    Time spent in minutes: 35    Calvert Cantor, MD [facility] Pager: [email or web address] Password [email or web address] 10/15/2017, 2:24 PM

## 2017-10-15 NOTE — Progress Notes (Addendum)
Pharmacy Antibiotic Note  Stephanie SchneidersJuliana Young is a 27 y.o. female admitted on 10/12/2017 with pneumonia.  Pharmacy has been consulted for vancomycin dosing.  Plan: Vancomycin 1500mg  x1 Loading dose.  Vancomycin 1000 IV every 8 hours.  Goal trough 15-20 mcg/mL.  Height: 5\' 5"  (165.1 cm) Weight: 156 lb 15.5 oz (71.2 kg) IBW/kg (Calculated) : 57  Temp (24hrs), Avg:100.7 F (38.2 C), Min:98.9 F (37.2 C), Max:103 F (39.4 C)  Recent Labs  Lab 10/12/17 1643 10/12/17 2121 10/13/17 0059 10/14/17 0433 10/15/17 0414  WBC 16.8*  --   --  13.4* 16.6*  CREATININE 0.60  --   --  0.68  --   LATICACIDVEN  --  0.84 0.8  --   --     Estimated Creatinine Clearance: 105.5 mL/min (by C-G formula based on SCr of 0.68 mg/dL).    No Known Allergies  Antimicrobials this admission: 8/10 Vancomycin >>  8/9 ampicillin/sulbactam >>   Dose adjustments this admission:  Microbiology results: 8/7 BCx: NGTD  Thank you for allowing pharmacy to be a part of this patient's care.  Bradley FerrisJoshua Latravion Graves, PharmD PGY1 Pharmacy Resident Direct Phone: [phone] 10/15/2017 11:23 AM

## 2017-10-15 NOTE — Progress Notes (Signed)
RT was stopped by RN in hallway.  RN stated patient was desating into 5980s.  RT put patient on continuous O2 monitoring and patient was sating around 67, patient had taken nasal cannula off to go to bathroom.  RT asked patient to put [place]mar on and patient moved up to 86 and 87.  Patient had dry cough but still managed to cough up some yellow/tan secretions.  RT gave patient PRN Albuterol treatment.  Patient stated she felt less SOB.  Patient still sating around 87 on 6L [place].  RT put patient on Venturi 40% on 8L.  Patient now sating around 89 to 90%.  RT will continue to monitor patient.

## 2017-10-16 ENCOUNTER — Inpatient Hospital Stay (HOSPITAL_COMMUNITY): Payer: Self-pay

## 2017-10-16 DIAGNOSIS — J9601 Acute respiratory failure with hypoxia: Secondary | ICD-10-CM

## 2017-10-16 DIAGNOSIS — J189 Pneumonia, unspecified organism: Secondary | ICD-10-CM

## 2017-10-16 DIAGNOSIS — J80 Acute respiratory distress syndrome: Secondary | ICD-10-CM

## 2017-10-16 LAB — BLOOD GAS, ARTERIAL
ACID-BASE EXCESS: 2.4 mmol/L — AB (ref 0.0–2.0)
Acid-Base Excess: 1.9 mmol/L (ref 0.0–2.0)
BICARBONATE: 26.4 mmol/L (ref 20.0–28.0)
Bicarbonate: 26.5 mmol/L (ref 20.0–28.0)
Drawn by: 280981
Drawn by: 518061
FIO2: 100
MECHVT: 460 mL
O2 CONTENT: 15 L/min
O2 SAT: 89.7 %
O2 Saturation: 99.4 %
PCO2 ART: 45.5 mmHg (ref 32.0–48.0)
PEEP: 10 cmH2O
PH ART: 7.427 (ref 7.350–7.450)
PO2 ART: 56.7 mmHg — AB (ref 83.0–108.0)
Patient temperature: 98.6
Patient temperature: 98.6
RATE: 18 resp/min
pCO2 arterial: 40.7 mmHg (ref 32.0–48.0)
pH, Arterial: 7.382 (ref 7.350–7.450)
pO2, Arterial: 248 mmHg — ABNORMAL HIGH (ref 83.0–108.0)

## 2017-10-16 LAB — GLUCOSE, CAPILLARY
GLUCOSE-CAPILLARY: 107 mg/dL — AB (ref 70–99)
GLUCOSE-CAPILLARY: 98 mg/dL (ref 70–99)

## 2017-10-16 LAB — TRIGLYCERIDES: Triglycerides: 60 mg/dL (ref <150)

## 2017-10-16 MED ORDER — ETOMIDATE 2 MG/ML IV SOLN: 20.0000 mg | Freq: Once | INTRAVENOUS | Status: DC

## 2017-10-16 MED ORDER — MIDAZOLAM HCL 5 MG/ML IJ SOLN
4.0000 mg | INTRAMUSCULAR | Status: DC | PRN
  Administered 2017-10-16: 4 mg via INTRAVENOUS

## 2017-10-16 MED ORDER — LEVALBUTEROL HCL 1.25 MG/0.5ML IN NEBU
1.2500 mg | INHALATION_SOLUTION | RESPIRATORY_TRACT | Status: DC
  Administered 2017-10-16: 1.25 mg via RESPIRATORY_TRACT
  Filled 2017-10-16 (×3): qty 0.5

## 2017-10-16 MED ORDER — ENSURE ENLIVE PO LIQD
237.0000 mL | Freq: Two times a day (BID) | ORAL | Status: DC
  Administered 2017-10-16: 237 mL via ORAL

## 2017-10-16 MED ORDER — ORAL CARE MOUTH RINSE
15.0000 mL | OROMUCOSAL | Status: DC
  Administered 2017-10-16 – 2017-10-24 (×76): 15 mL via OROMUCOSAL

## 2017-10-16 MED ORDER — PROPOFOL 1000 MG/100ML IV EMUL
INTRAVENOUS | Status: AC
  Administered 2017-10-16: 40 ug/kg/min via INTRAVENOUS
  Filled 2017-10-16: qty 100

## 2017-10-16 MED ORDER — FENTANYL 2500MCG IN NS 250ML (10MCG/ML) PREMIX INFUSION
0.0000 ug/h | INTRAVENOUS | Status: DC
  Administered 2017-10-16: 325 ug/h via INTRAVENOUS
  Administered 2017-10-16: 100 ug/h via INTRAVENOUS
  Administered 2017-10-17: 400 ug/h via INTRAVENOUS
  Administered 2017-10-17: 325 ug/h via INTRAVENOUS
  Administered 2017-10-17 – 2017-10-19 (×7): 400 ug/h via INTRAVENOUS
  Filled 2017-10-16 (×11): qty 250

## 2017-10-16 MED ORDER — MIDAZOLAM HCL 50 MG/10ML IJ SOLN
0.5000 mg/h | INTRAMUSCULAR | Status: DC
  Administered 2017-10-16: 0.5 mg/h via INTRAVENOUS
  Filled 2017-10-16: qty 10

## 2017-10-16 MED ORDER — ALBUTEROL SULFATE (2.5 MG/3ML) 0.083% IN NEBU
2.5000 mg | INHALATION_SOLUTION | RESPIRATORY_TRACT | Status: DC
  Administered 2017-10-16 – 2017-10-24 (×47): 2.5 mg via RESPIRATORY_TRACT
  Filled 2017-10-16 (×47): qty 3

## 2017-10-16 MED ORDER — MIDAZOLAM HCL 2 MG/2ML IJ SOLN
INTRAMUSCULAR | Status: AC
  Administered 2017-10-16: 5 mg via INTRAVENOUS
  Filled 2017-10-16: qty 6

## 2017-10-16 MED ORDER — SUCCINYLCHOLINE CHLORIDE 20 MG/ML IJ SOLN: 80.0000 mg | Freq: Once | INTRAMUSCULAR | Status: DC

## 2017-10-16 MED ORDER — MIDAZOLAM HCL 2 MG/2ML IJ SOLN
INTRAMUSCULAR | Status: AC
  Filled 2017-10-16: qty 4

## 2017-10-16 MED ORDER — FENTANYL CITRATE (PF) 100 MCG/2ML IJ SOLN
INTRAMUSCULAR | Status: AC
  Administered 2017-10-16: 50 ug via INTRAVENOUS
  Filled 2017-10-16: qty 2

## 2017-10-16 MED ORDER — FENTANYL CITRATE (PF) 100 MCG/2ML IJ SOLN
25.0000 ug | INTRAMUSCULAR | Status: DC | PRN
  Administered 2017-10-16: 50 ug via INTRAVENOUS
  Administered 2017-10-18: 100 ug via INTRAVENOUS
  Administered 2017-10-18: 50 ug via INTRAVENOUS
  Administered 2017-10-18 – 2017-10-19 (×5): 100 ug via INTRAVENOUS

## 2017-10-16 MED ORDER — MIDAZOLAM HCL 2 MG/2ML IJ SOLN: 2.5000 mg | Freq: Once | INTRAMUSCULAR | Status: DC

## 2017-10-16 MED ORDER — FENTANYL CITRATE (PF) 100 MCG/2ML IJ SOLN
100.0000 ug | Freq: Once | INTRAMUSCULAR | Status: AC
  Administered 2017-10-16: 50 ug via INTRAVENOUS

## 2017-10-16 MED ORDER — VANCOMYCIN HCL IN DEXTROSE 1-5 GM/200ML-% IV SOLN
1000.0000 mg | Freq: Three times a day (TID) | INTRAVENOUS | Status: DC
  Administered 2017-10-16 – 2017-10-17 (×4): 1000 mg via INTRAVENOUS
  Filled 2017-10-16 (×6): qty 200

## 2017-10-16 MED ORDER — MIDAZOLAM HCL 2 MG/2ML IJ SOLN
5.0000 mg | Freq: Once | INTRAMUSCULAR | Status: AC
  Administered 2017-10-16: 5 mg via INTRAVENOUS

## 2017-10-16 MED ORDER — PROPOFOL 1000 MG/100ML IV EMUL
5.0000 ug/kg/min | INTRAVENOUS | Status: DC
  Administered 2017-10-16 (×3): 60 ug/kg/min via INTRAVENOUS
  Administered 2017-10-16 – 2017-10-17 (×2): 40 ug/kg/min via INTRAVENOUS
  Administered 2017-10-17: 80 ug/kg/min via INTRAVENOUS
  Administered 2017-10-17 (×2): 60 ug/kg/min via INTRAVENOUS
  Administered 2017-10-17: 40 ug/kg/min via INTRAVENOUS
  Administered 2017-10-17: 80 ug/kg/min via INTRAVENOUS
  Administered 2017-10-18: 30 ug/kg/min via INTRAVENOUS
  Filled 2017-10-16 (×10): qty 100

## 2017-10-16 MED ORDER — ETOMIDATE 2 MG/ML IV SOLN
20.0000 mg | Freq: Once | INTRAVENOUS | Status: AC
  Administered 2017-10-16: 20 mg via INTRAVENOUS

## 2017-10-16 MED ORDER — MIDAZOLAM HCL 2 MG/2ML IJ SOLN
4.0000 mg | INTRAMUSCULAR | Status: DC | PRN
  Administered 2017-10-18: 4 mg via INTRAVENOUS

## 2017-10-16 MED ORDER — SUCCINYLCHOLINE CHLORIDE 20 MG/ML IJ SOLN
80.0000 mg | Freq: Once | INTRAMUSCULAR | Status: AC
  Administered 2017-10-16: 80 mg via INTRAVENOUS
  Filled 2017-10-16: qty 4

## 2017-10-16 MED ORDER — CHLORHEXIDINE GLUCONATE 0.12% ORAL RINSE (MEDLINE KIT)
15.0000 mL | Freq: Two times a day (BID) | OROMUCOSAL | Status: DC
  Administered 2017-10-16 – 2017-10-25 (×18): 15 mL via OROMUCOSAL

## 2017-10-16 NOTE — Progress Notes (Signed)
RN paged RT to see if pt could tolerate another O2 delivery device, as pt had been on NRB for multiple hours. RT placed pt on 15L HFNC, and RN came to assess pt with sats in the 70s. Pt breathing shallow at 30 bpm. RN placed pt back on 15L NRB and sats improved to 90%. RN paged MD to request pt be put on BiPAP, MD called pt's room and spoke with pt. MD stated no BiPAP was needed at this time, but to transfer pt to SDU and keep NRB on. Will continue to monitor.  Blane OharaSavannah Obelia Bonello, RN

## 2017-10-16 NOTE — Consult Note (Signed)
PULMONARY / CRITICAL CARE MEDICINE   Name: Stephanie Young MRN: [id number] DOB: [birth year]    ADMISSION DATE:  10/12/2017 CONSULTATION DATE:  10/16/17  REFERRING MD: Dr. Wynelle Cleveland  CHIEF COMPLAINT:  Worsening dyspnea   HPI: Stephanie Young is a 27 y.o. female without significant past medical history, who presents with a cough, shortness of breath and chest pain.  Patient states that she has been having cough, shortness of breath and chest pain for almost 4 days. She coughs up clear mucus. Her chest pain is located in the frontal chest, sharp, mild, nonradiating, pleuritic, aggravated by deep breath and coughing. Patient has a subjective fever and chills. She states she has nausea and vomited several times, which has resolved. Currently patient does not have nausea, vomiting, diarrhea or abdominal pain. No symptoms of UTI. No unilateral weakness.  ED Course: pt was found to have  WBC 16.8, lactic acid 0.84, negative pregnancy test, potassium 3.3, creatinine normal, temperature 99.7, tachycardia, tachypnea, O2 sat are 96% on room air. CT angiogram is negative for PE, but showed multifocal pneumonia. Patient is placed on telemetry bed for observation.  8/11  I was asked to see the patient today for worsening respiratory distress.  She appears quite tired and has increased work of breathing. Has a temp today of 100.7 The patient has a Pa02 of 57 on a NRM. Her recent CT scan chest shows multifocal infitlrates Her urine for Strep and Legionella was negative. Her MRSA nasal spec was negative for MRSA. Her CXR appears much worse today than iot did on 8/7. WBC is 16K and Procal 0.56. The patient has no hitory of recreational drug use, recent travel. Had a negative HIV test.The patient is on no immunosuppressive therapy.  She does not smoke. Ha s most recently been on Azithromax, Unasyn and Vanco. The patient has been seen by ID.  PAST MEDICAL HISTORY :  She  has a past medical history of H. pylori  infection and Kidney stones.  PAST SURGICAL HISTORY: She  has a past surgical history that includes dental procedure.  No Known Allergies  No current facility-administered medications on file prior to encounter.    Current Outpatient Medications on File Prior to Encounter  Medication Sig  . acetaminophen (TYLENOL) 500 MG tablet Take 1,000 mg by mouth every 6 (six) hours as needed for moderate pain.   Marland Kitchen albuterol (PROVENTIL HFA;VENTOLIN HFA) 108 (90 Base) MCG/ACT inhaler Inhale 2 puffs into the lungs every 6 (six) hours as needed for wheezing or shortness of breath.  . dicyclomine (BENTYL) 10 MG capsule Take 1 capsule (10 mg total) by mouth 4 (four) times daily -  before meals and at bedtime. As needed for abdominal cramping and loose stool  . tiZANidine (ZANAFLEX) 4 MG tablet Take 1 tablet (4 mg total) by mouth every 6 (six) hours as needed for muscle spasms.  . hydrocortisone (ANUSOL-HC) 2.5 % rectal cream Place 1 application rectally 2 (two) times daily. As needed for rectal discomfort (Patient not taking: Reported on 10/12/2017)    FAMILY HISTORY:  Her family history includes Breast cancer (age of onset: 91) in her mother; Diabetes in her maternal uncle and other; Hypertension in her brother and mother. There is no history of Colon cancer or Colon polyps.  SOCIAL HISTORY: She  reports that she has quit smoking. She has never used smokeless tobacco. She reports that she drinks alcohol. She reports that she has current or past drug history. Drug: Marijuana.  REVIEW OF SYSTEMS:  General: has subjective fevers, chills, no body weight gain, has fatigue HEENT: no blurry vision, hearing changes or sore throat Respiratory: no dyspnea, coughing, wheezing CV: has chest pain, no palpitations GI: has nausea, vomiting, no abdominal pain, diarrhea, constipation GU: no dysuria, burning on urination, increased urinary frequency, hematuria  Ext: no leg edema Neuro: no unilateral weakness, numbness,  or tingling, no vision change or hearing loss Skin: no rash, no skin tear. MSK: No muscle spasm, no deformity, no limitation of range of movement in spin Heme: No easy bruising.        VITAL SIGNS: BP 137/81 (BP Location: Right Arm)   Pulse 98   Temp 98.9 F (37.2 C) (Oral)   Resp (!) 28   Ht _0  (1.651 m)   Wt 71.2 kg   SpO2 94%   BMI 26.12 kg/m          INTAKE / OUTPUT: I/O last 3 completed shifts: In: 4503.9 [P.O.:480; I.V.:2723.9; IV Piggyback:1300] Out: -   PHYSICAL EXAMINATION: General:  Patient appears acutely ill, She appears quite tired Neuro:  Awake and alert, moving all extr,cooperative HEENT: [place]/AT Cardiovascular:  RRR s1s2 Lungs: bilateral BS Abdomen:  Soft, BS, non-tender Extr: s/c/c/e Skin:  Warm and dry  LABS:  BMET Recent Labs  Lab 10/12/17 1643 10/14/17 0433  NA 135 139  K 3.3* 3.5  CL 101 105  CO2 21* 21*  BUN 6 <5*  CREATININE 0.60 0.68  GLUCOSE 109* 111*    Electrolytes Recent Labs  Lab 10/12/17 1643 10/14/17 0433  CALCIUM 9.3 8.7*    CBC Recent Labs  Lab 10/12/17 1643 10/14/17 0433 10/15/17 0414  WBC 16.8* 13.4* 16.6*  HGB 11.4* 9.9* 9.4*  HCT 35.6* 30.9* 29.9*  PLT 398 342 355    Coag's No results for input(s): APTT, INR in the last 168 hours.  Sepsis Markers Recent Labs  Lab 10/12/17 2121 10/13/17 0059  LATICACIDVEN 0.84 0.8  PROCALCITON  --  0.56    ABG Recent Labs  Lab 10/16/17 1150  PHART 7.427  PCO2ART 40.7  PO2ART 56.7*    Liver Enzymes Recent Labs  Lab 10/12/17 1911  AST 22  ALT 17  ALKPHOS 66  BILITOT 1.4*  ALBUMIN 3.7    Cardiac Enzymes No results for input(s): TROPONINI, PROBNP in the last 168 hours.  Glucose No results for input(s): GLUCAP in the last 168 hours.  Imaging No results found.    DISCUSSION: Patient with several days history f worsenign shortness of breath and cough. Has had subjective fever and chills. CXR and CT scan shows bilateral alveolar  infiltrates that appear to be getting worse despit abx therapy.  ASSESSMENT / PLAN:  PULMONARY/INFX  The patient has a very large A-a gradient and appears to be fatiguing. She may require intubation and I have mentioned this to her and her mother. In the patient is intubted we will proceed with diagnostic BAL and I havealso mentioned this to patient and her mother. Unclear why she is not responding to abx at this time. There is no reason to believe she is immunosuppressed. The patient is on a regimen of Unasyn and Vanco presently. I just completed FOB and sent for fungus,AFB, routine culture, legionella DFA, PCP.   CARDIOVASCULAR  Patient is tachycardic. Maintaining her BP  RENAL Normal renal fn. At this time      FAMILY I have discussed her condtion, our intended management with her mother who has been at the bedside. I did  explain to her that her daughter was very ill.        Micheal Likens MD Pulmonary and [place] Pager: [phone] Cell [phone]  10/16/2017, 12:14 PM

## 2017-10-16 NOTE — Progress Notes (Signed)
Called to bedside due to episode of desaturation in the 70's.  On arrival to room pt on NRB @15L ,  Spo2 88-90%.  Pt has shallow respirations as well as tachypnea.  Pt states she feels tired and that her breathing is getting worse.  Dr. Butler Denmarkizwan called with concerns that pt now requiring 100% NRB with Spo2 at best 92% and seems to be tiring out.  Suggested that pt needs Bipap.  MD states that she does not feel pt needs Bipap at this time and would call into pt's room to speak with her.  MD states that pt is able to speak in full sentences and does not seem to be in distress,  will continue with 100% NRB.

## 2017-10-16 NOTE — Progress Notes (Signed)
RT tried Venturi 40% on 8L, patient still dropping down to 86%.  RT spoke with on-call physician Santina EvansKathryn Shorr, she wants to try everything we can before using Bipap.  Dr. Alden ServerShorr wants to institute IS therapy as well.  Have placed patient on non-rebreather 15L, patient's sats now at 96%.  RT will continue to monitor.

## 2017-10-16 NOTE — Progress Notes (Addendum)
[facility]  I evaluated the patient today and decided to transfer her to the SDU due to the level of hypoxia she has acutely developed overnight. I did also speak with PCCM as I felt she was nearing intubation. I have further discussed the case with ID as well.  Since my initial conversation with PCCM, she has been intubated and underwent a bronchoscopy. [facility] will sign off for now. Please call us when she is transferred out of the ICU.   Calvert CantorSaima Everrett Lacasse, MD

## 2017-10-16 NOTE — Progress Notes (Signed)
Per ID, pt to be transferred to 2M10, a negative pressure room while we wait for the results of the BAL TB test.

## 2017-10-16 NOTE — Progress Notes (Signed)
Pt transported from 2M08 to 2M10 on vent by RT.

## 2017-10-16 NOTE — Progress Notes (Addendum)
    [facility] Infectious Disease    Date of Admission:  10/12/2017   Total days of antibiotics 5        Day 2 vancomycin   ID: Stephanie Young is a 27 y.o. female with multifocal pneumonia Principal Problem:   Multifocal pneumonia Active Problems:   Hypokalemia   Sepsis ([place])   Lobar pneumonia ([place])    Subjective: Having ongoing hypoxia, requiring NRB - intubated this morning Medications:  . chlorpheniramine-HYDROcodone  5 mL Oral Q12H  . enoxaparin (LOVENOX) injection  40 mg Subcutaneous Daily  . feeding supplement (ENSURE ENLIVE)  237 mL Oral BID BM  . guaiFENesin  600 mg Oral BID  . levalbuterol  1.25 mg Nebulization Q4H    Objective: Vital signs in last 24 hours: Temp:  [98.9 F (37.2 C)-100.9 F (38.3 C)] 98.9 F (37.2 C) (08/11 0725) Pulse Rate:  [98-117] 98 (08/11 0725) Resp:  [16-28] 28 (08/11 1015) BP: (129-144)/(72-81) 137/81 (08/11 0725) SpO2:  [88 %-99 %] 92 % (08/11 1015) Physical Exam  Constitutional:  Sedated. appears well-developed and well-nourished. No distress.  HENT: [place]/AT, PERRLA, no scleral icterus Mouth/Throat: OETT in place Cardiovascular: tachycardia, regular rhythm and normal heart sounds. Exam reveals no gallop and no friction rub.  No murmur heard.  Pulmonary/Chest: CTAB, Abdominal: Soft. Bowel sounds are normal.  exhibits no distension. There is no tenderness.  Lymphadenopathy: no cervical adenopathy. No axillary adenopathy Skin: Skin is warm and dry. No rash noted. No erythema.    Lab Results Recent Labs    10/14/17 0433 10/15/17 0414  WBC 13.4* 16.6*  HGB 9.9* 9.4*  HCT 30.9* 29.9*  NA 139  --   K 3.5  --   CL 105  --   CO2 21*  --   BUN <5*  --   CREATININE 0.68  --     Microbiology: Reviewed, mrsa screen is negative Studies/Results: Dg Chest 2 View  Result Date: 10/15/2017 CLINICAL DATA:  Fever and cough. EXAM: CHEST - 2 VIEW COMPARISON:  October 12, 2017 FINDINGS: The cardiomediastinal silhouette is stable.  No pneumothorax. Bilateral patchy pulmonary infiltrates suggest multifocal pneumonia. No other acute abnormalities. IMPRESSION: Multifocal pneumonia, worse on today's chest x-ray compared to the October 12, 2017 x-ray. Electronically Signed   By: Dorise Bullion III M.D   On: 10/15/2017 08:44     Assessment/Plan: Multifocal pneumonia = concern that she is developing ARDS 2/2 pneumonia.  Would continue on vancomycin plus amp/sub. Agree with work up of BAL sent for further studies per [facility] Cell: [phone] Pager: [phone]  10/16/2017, 11:03 AM

## 2017-10-17 ENCOUNTER — Inpatient Hospital Stay (HOSPITAL_COMMUNITY): Payer: Self-pay

## 2017-10-17 ENCOUNTER — Other Ambulatory Visit (HOSPITAL_COMMUNITY): Payer: Self-pay

## 2017-10-17 ENCOUNTER — Inpatient Hospital Stay: Payer: Self-pay

## 2017-10-17 DIAGNOSIS — Z9911 Dependence on respirator [ventilator] status: Secondary | ICD-10-CM

## 2017-10-17 DIAGNOSIS — R509 Fever, unspecified: Secondary | ICD-10-CM

## 2017-10-17 LAB — CBC WITH DIFFERENTIAL/PLATELET
ABS IMMATURE GRANULOCYTES: 0.2 10*3/uL — AB (ref 0.0–0.1)
BASOS ABS: 0 10*3/uL (ref 0.0–0.1)
Basophils Relative: 0 %
Eosinophils Absolute: 0.7 10*3/uL (ref 0.0–0.7)
Eosinophils Relative: 5 %
HEMATOCRIT: 30.2 % — AB (ref 36.0–46.0)
Hemoglobin: 9.5 g/dL — ABNORMAL LOW (ref 12.0–15.0)
IMMATURE GRANULOCYTES: 1 %
LYMPHS ABS: 1.4 10*3/uL (ref 0.7–4.0)
LYMPHS PCT: 10 %
MCH: 27.4 pg (ref 26.0–34.0)
MCHC: 31.5 g/dL (ref 30.0–36.0)
MCV: 87 fL (ref 78.0–100.0)
Monocytes Absolute: 0.2 10*3/uL (ref 0.1–1.0)
Monocytes Relative: 2 %
NEUTROS ABS: 12 10*3/uL — AB (ref 1.7–7.7)
NEUTROS PCT: 82 %
Platelets: 350 10*3/uL (ref 150–400)
RBC: 3.47 MIL/uL — AB (ref 3.87–5.11)
RDW: 13.9 % (ref 11.5–15.5)
WBC: 14.6 10*3/uL — ABNORMAL HIGH (ref 4.0–10.5)

## 2017-10-17 LAB — BASIC METABOLIC PANEL
ANION GAP: 9 (ref 5–15)
BUN: 6 mg/dL (ref 6–20)
CO2: 28 mmol/L (ref 22–32)
CREATININE: 0.68 mg/dL (ref 0.44–1.00)
Calcium: 8.3 mg/dL — ABNORMAL LOW (ref 8.9–10.3)
Chloride: 103 mmol/L (ref 98–111)
GFR calc non Af Amer: >60 mL/min (ref >60)
Glucose, Bld: 95 mg/dL (ref 70–99)
Potassium: 3 mmol/L — ABNORMAL LOW (ref 3.5–5.1)
SODIUM: 140 mmol/L (ref 135–145)

## 2017-10-17 LAB — GLUCOSE, CAPILLARY
GLUCOSE-CAPILLARY: 80 mg/dL (ref 70–99)
GLUCOSE-CAPILLARY: 83 mg/dL (ref 70–99)
GLUCOSE-CAPILLARY: 88 mg/dL (ref 70–99)
GLUCOSE-CAPILLARY: 104 mg/dL — AB (ref 70–99)
Glucose-Capillary: 107 mg/dL — ABNORMAL HIGH (ref 70–99)
Glucose-Capillary: 87 mg/dL (ref 70–99)

## 2017-10-17 LAB — POCT I-STAT 3, ART BLOOD GAS (G3+)
Bicarbonate: 25.4 mmol/L (ref 20.0–28.0)
Bicarbonate: 26 mmol/L (ref 20.0–28.0)
O2 SAT: 93 %
O2 Saturation: 93 %
PH ART: 7.377 (ref 7.350–7.450)
PH ART: 7.698 — AB (ref 7.350–7.450)
PO2 ART: 74 mmHg — AB (ref 83.0–108.0)
TCO2: 27 mmol/L (ref 22–32)
TCO2: 27 mmol/L (ref 22–32)
pCO2 arterial: 43.7 mmHg (ref 32.0–48.0)
pCO2 arterial: 16.4 mmHg — CL (ref 32.0–48.0)
pO2, Arterial: <15 mmHg — CL (ref 83.0–108.0)

## 2017-10-17 LAB — CULTURE, BLOOD (ROUTINE X 2)
CULTURE: NO GROWTH
Culture: NO GROWTH
Special Requests: ADEQUATE

## 2017-10-17 LAB — MAGNESIUM: MAGNESIUM: 1.9 mg/dL (ref 1.7–2.4)

## 2017-10-17 LAB — VANCOMYCIN, TROUGH: Vancomycin Tr: 9 ug/mL — ABNORMAL LOW (ref 15–20)

## 2017-10-17 LAB — PHOSPHORUS: PHOSPHORUS: 3.5 mg/dL (ref 2.5–4.6)

## 2017-10-17 LAB — AMYLASE: AMYLASE: 24 U/L — AB (ref 28–100)

## 2017-10-17 LAB — PATHOLOGIST SMEAR REVIEW

## 2017-10-17 MED ORDER — SODIUM CHLORIDE 0.9% FLUSH
10.0000 mL | Freq: Two times a day (BID) | INTRAVENOUS | Status: DC
Start: 2017-10-17 — End: 2017-11-01
  Administered 2017-10-17 – 2017-10-20 (×7): 10 mL
  Administered 2017-10-21: 40 mL
  Administered 2017-10-21 – 2017-10-27 (×10): 10 mL
  Administered 2017-10-27: 20 mL
  Administered 2017-10-28 – 2017-11-01 (×6): 10 mL

## 2017-10-17 MED ORDER — INSULIN ASPART 100 UNIT/ML ~~LOC~~ SOLN
2.0000 [IU] | SUBCUTANEOUS | Status: DC
  Administered 2017-10-19 – 2017-10-25 (×8): 2 [IU] via SUBCUTANEOUS

## 2017-10-17 MED ORDER — POTASSIUM CHLORIDE 20 MEQ/15ML (10%) PO SOLN
30.0000 meq | ORAL | Status: AC
  Administered 2017-10-17 (×2): 30 meq
  Filled 2017-10-17 (×2): qty 30

## 2017-10-17 MED ORDER — VANCOMYCIN HCL 500 MG IV SOLR
500.0000 mg | INTRAVENOUS | Status: AC
  Administered 2017-10-17: 500 mg via INTRAVENOUS
  Filled 2017-10-17: qty 500

## 2017-10-17 MED ORDER — MAGNESIUM SULFATE 2 GM/50ML IV SOLN
2.0000 g | Freq: Once | INTRAVENOUS | Status: AC
  Administered 2017-10-17: 2 g via INTRAVENOUS
  Filled 2017-10-17: qty 50

## 2017-10-17 MED ORDER — VANCOMYCIN HCL 10 G IV SOLR
1500.0000 mg | Freq: Three times a day (TID) | INTRAVENOUS | Status: DC
  Administered 2017-10-17 – 2017-10-21 (×11): 1500 mg via INTRAVENOUS
  Filled 2017-10-17 (×12): qty 1500

## 2017-10-17 MED ORDER — SODIUM CHLORIDE 0.9 % IV SOLN
0.5000 mg/h | INTRAVENOUS | Status: DC
  Administered 2017-10-17: 7 mg/h via INTRAVENOUS
  Administered 2017-10-17: 9 mg/h via INTRAVENOUS
  Administered 2017-10-18: 8 mg/h via INTRAVENOUS
  Administered 2017-10-18: 9 mg/h via INTRAVENOUS
  Administered 2017-10-18: 10 mg/h via INTRAVENOUS
  Administered 2017-10-18: 9 mg/h via INTRAVENOUS
  Administered 2017-10-19: 10 mg/h via INTRAVENOUS
  Filled 2017-10-17 (×8): qty 10

## 2017-10-17 MED ORDER — FREE WATER
100.0000 mL | Status: DC
  Administered 2017-10-17 – 2017-10-22 (×29): 100 mL

## 2017-10-17 MED ORDER — FUROSEMIDE 10 MG/ML IJ SOLN
20.0000 mg | Freq: Once | INTRAMUSCULAR | Status: AC
  Administered 2017-10-17: 20 mg via INTRAVENOUS
  Filled 2017-10-17: qty 2

## 2017-10-17 MED ORDER — CHLORHEXIDINE GLUCONATE CLOTH 2 % EX PADS
6.0000 | MEDICATED_PAD | Freq: Every day | CUTANEOUS | Status: DC
  Administered 2017-10-18 – 2017-10-21 (×5): 6 via TOPICAL

## 2017-10-17 MED ORDER — ADULT MULTIVITAMIN LIQUID CH
15.0000 mL | Freq: Every day | ORAL | Status: DC
  Administered 2017-10-17 – 2017-10-24 (×8): 15 mL
  Filled 2017-10-17 (×11): qty 15

## 2017-10-17 MED ORDER — SODIUM CHLORIDE 0.9% FLUSH: 10.0000 mL | INTRAVENOUS | Status: DC | PRN

## 2017-10-17 MED ORDER — PRO-STAT SUGAR FREE PO LIQD
30.0000 mL | Freq: Three times a day (TID) | ORAL | Status: DC
  Administered 2017-10-17 – 2017-10-20 (×11): 30 mL
  Filled 2017-10-17 (×11): qty 30

## 2017-10-17 MED ORDER — VITAL HIGH PROTEIN PO LIQD
1000.0000 mL | ORAL | Status: DC
  Administered 2017-10-17 – 2017-10-19 (×2): 1000 mL

## 2017-10-17 MED ORDER — PANTOPRAZOLE SODIUM 40 MG PO PACK
40.0000 mg | PACK | Freq: Every day | ORAL | Status: DC
  Administered 2017-10-17 – 2017-10-24 (×8): 40 mg
  Filled 2017-10-17 (×10): qty 20

## 2017-10-17 NOTE — Progress Notes (Signed)
eLink Physician-Brief Progress Note Patient Name: Stephanie SchneidersJuliana Morales DOB: Feb 14, 1991 MRN: [id number]   Date of Service  10/17/2017  HPI/Events of Note  Fever to 102.1 F - Patient is already on Unasyn and Vancomycin. Tylenol already ordered. Last blood cultures from 10/10/2017.  eICU Interventions  Will order: 1. Blood cultures X 2.      Intervention Category Major Interventions: Infection - evaluation and management  Jahdai Padovano Eugene 10/17/2017, 4:31 AM

## 2017-10-17 NOTE — Progress Notes (Signed)
Initial Nutrition Assessment  DOCUMENTATION CODES:   Not applicable  INTERVENTION:   Initiate vital HP @40ml /hr + Prostat liquid protein 30 ml TID via tube, each supplement provides 100 kcal, 15 grams protein.  Free water flushes 100ml q 6 hours   Propofol: 25.6 ml/hr- provides 676kcal/day   Regimen provides 1936kcal/day, 129g/day protein, 143902ml/day free water  MVI liquid daily via tube   NUTRITION DIAGNOSIS:   Inadequate oral intake related to acute illness as evidenced by NPO status(pt sedated and ventilated ).  GOAL:   Provide needs based on ASPEN/SCCM guidelines  MONITOR:   Vent status, Labs, Weight trends, TF tolerance, Skin, I & O's  REASON FOR ASSESSMENT:   Consult Enteral/tube feeding initiation and management  ASSESSMENT:   27 y/o female admitted with PNA and ARDS    Pt sedated and ventilated. No family at bedside. Per chart, pt with poor appetite and oral intake for several days pta. Per chart, pt with 39lb(20%) wt loss in 8 months. This is significant wt loss; however, RD is unsure if weight loss was intentional. Plan to initiate tube feeds today; pt likely at moderate refeed risk. Recommend monitor K, Mg and P labs daily for at least 3 days.   Medications reviewed and include: lovenox, insulin, protonix, NaCl @10ml /hr, unasyn, fentanyl, propofol, vancomycin  Labs reviewed: Na 140 wnl, K 3.0(L), P 3.5 wnl, Mg 1.9 wnl Wbc- 14.6(H), Hgb 9.5(L), Hct 30.2(L)  Patient is currently intubated on ventilator support MV: 8.1 L/min Temp (24hrs), Avg:100.1 F (37.8 C), Min:98.4 F (36.9 C), Max:102.1 F (38.9 C)  Propofol: 25.6 ml/hr- provides 676kcal/day   MAP- >3760mmHg  NUTRITION - FOCUSED PHYSICAL EXAM:    Most Recent Value  Orbital Region  No depletion  Upper Arm Region  No depletion  Thoracic and Lumbar Region  No depletion  Buccal Region  No depletion  Temple Region  No depletion  Clavicle Bone Region  No depletion  Clavicle and Acromion Bone  Region  No depletion  Scapular Bone Region  No depletion  Dorsal Hand  No depletion  Patellar Region  No depletion  Anterior Thigh Region  No depletion  Posterior Calf Region  No depletion  Edema (RD Assessment)  None  Hair  Reviewed  Eyes  Reviewed  Mouth  Reviewed  Skin  Reviewed  Nails  Reviewed     Diet Order:   Diet Order    None     EDUCATION NEEDS:   No education needs have been identified at this time  Skin:  Skin Assessment: Reviewed RN Assessment  Last BM:  8/10- TYPE 7  Height:   Ht Readings from Last 1 Encounters:  10/13/17 5\' 5"  (1.651 m)    Weight:   Wt Readings from Last 1 Encounters:  10/13/17 71.2 kg    Ideal Body Weight:  56.8 kg  BMI:  Body mass index is 26.12 kg/m.  Estimated Nutritional Needs:   Kcal:  1933kcal/day   Protein:  114-128g/day   Fluid:  >1.7L/day   Betsey Holidayasey Hodge Stachnik MS, RD, LDN Pager #- [phone] Office#- [phone] After Hours Pager: [phone]

## 2017-10-17 NOTE — Progress Notes (Signed)
INFECTIOUS DISEASE PROGRESS NOTE  ID: Stephanie Young is a 27 y.o. female with  Principal Problem:   Multifocal pneumonia Active Problems:   Hypokalemia   Sepsis ([place])   Lobar pneumonia ([place])   Acute respiratory failure with hypoxia ([facility])   ARDS (adult respiratory distress syndrome) ([facility])  Subjective: On vent, sedation.   Abtx:  Anti-infectives (From admission, onward)   Start     Dose/Rate Route Frequency Ordered Stop   10/16/17 1330  vancomycin (VANCOCIN) IVPB 1000 mg/200 mL premix     1,000 mg 200 mL/hr over 60 Minutes Intravenous Every 8 hours 10/16/17 1253     10/16/17 0000  vancomycin (VANCOCIN) IVPB 1000 mg/200 mL premix  Status:  Discontinued     1,000 mg 200 mL/hr over 60 Minutes Intravenous Every 12 hours 10/15/17 1132 10/15/17 1426   10/16/17 0000  vancomycin (VANCOCIN) IVPB 1000 mg/200 mL premix  Status:  Discontinued     1,000 mg 200 mL/hr over 60 Minutes Intravenous Every 12 hours 10/15/17 1451 10/16/17 1253   10/15/17 1200  vancomycin (VANCOCIN) 1,500 mg in sodium chloride 0.9 % 500 mL IVPB     1,500 mg 250 mL/hr over 120 Minutes Intravenous  Once 10/15/17 1132 10/16/17 1320   10/15/17 1000  levofloxacin (LEVAQUIN) IVPB 750 mg  Status:  Discontinued     750 mg 100 mL/hr over 90 Minutes Intravenous Every 24 hours 10/15/17 0954 10/15/17 1001   10/14/17 1530  Ampicillin-Sulbactam (UNASYN) 3 g in sodium chloride 0.9 % 100 mL IVPB     3 g 200 mL/hr over 30 Minutes Intravenous Every 6 hours 10/14/17 1525     10/13/17 2000  levofloxacin (LEVAQUIN) IVPB 750 mg  Status:  Discontinued     750 mg 100 mL/hr over 90 Minutes Intravenous  Once 10/13/17 0257 10/13/17 0752   10/13/17 2000  levofloxacin (LEVAQUIN) IVPB 750 mg  Status:  Discontinued     750 mg 100 mL/hr over 90 Minutes Intravenous Every 24 hours 10/13/17 0752 10/13/17 0817   10/13/17 2000  azithromycin (ZITHROMAX) 500 mg in sodium chloride 0.9 % 250 mL IVPB  Status:  Discontinued     500 mg 250 mL/hr over  60 Minutes Intravenous Every 24 hours 10/13/17 0817 10/16/17 1252   10/13/17 1300  cefTRIAXone (ROCEPHIN) 1 g in sodium chloride 0.9 % 100 mL IVPB  Status:  Discontinued     1 g 200 mL/hr over 30 Minutes Intravenous Every 24 hours 10/13/17 1258 10/14/17 1520   10/13/17 1000  cefTRIAXone (ROCEPHIN) 1 g in sodium chloride 0.9 % 100 mL IVPB  Status:  Discontinued     1 g 200 mL/hr over 30 Minutes Intravenous Every 24 hours 10/13/17 0817 10/13/17 1259   10/12/17 2030  levofloxacin (LEVAQUIN) IVPB 750 mg     750 mg 100 mL/hr over 90 Minutes Intravenous  Once 10/12/17 2017 10/12/17 2252      Medications:  Scheduled: . albuterol  2.5 mg Nebulization Q4H  . chlorhexidine gluconate (MEDLINE KIT)  15 mL Mouth Rinse BID  . enoxaparin (LOVENOX) injection  40 mg Subcutaneous Daily  . feeding supplement (ENSURE ENLIVE)  237 mL Oral BID BM  . furosemide  20 mg Intravenous Once  . insulin aspart  2-6 Units Subcutaneous Q4H  . mouth rinse  15 mL Mouth Rinse 10 times per day  . pantoprazole sodium  40 mg Per Tube Daily  . potassium chloride  30 mEq Per Tube Q4H    Objective: Vital  signs in last 24 hours: Temp:  [98.4 F (36.9 C)-102.1 F (38.9 C)] 101.3 F (38.5 C) (08/12 0800) Pulse Rate:  [91-141] 108 (08/12 0800) Resp:  [12-34] 18 (08/12 0900) BP: (94-181)/(44-136) 116/52 (08/12 0900) SpO2:  [86 %-100 %] 94 % (08/12 0900) FiO2 (%):  [40 %-100 %] 40 % (08/12 0800)   General appearance: no distress Resp: diminished breath sounds anterior - bilateral Cardio: regular rate and rhythm GI: normal findings: bowel sounds normal and soft, non-tender Extremities: edema none  Lab Results Recent Labs    10/15/17 0414 10/17/17 0347  WBC 16.6* 14.6*  HGB 9.4* 9.5*  HCT 29.9* 30.2*  NA  --  140  K  --  3.0*  CL  --  103  CO2  --  28  BUN  --  6  CREATININE  --  0.68   Liver Panel No results for input(s): PROT, ALBUMIN, AST, ALT, ALKPHOS, BILITOT, BILIDIR, IBILI in the last 72  hours. Sedimentation Rate No results for input(s): ESRSEDRATE in the last 72 hours. C-Reactive Protein No results for input(s): CRP in the last 72 hours.  Microbiology: Recent Results (from the past 240 hour(s))  Blood culture (routine x 2)     Status: None (Preliminary result)   Collection Time: 10/12/17  8:20 PM  Result Value Ref Range Status   Specimen Description BLOOD RIGHT ANTECUBITAL  Final   Special Requests   Final    BOTTLES DRAWN AEROBIC AND ANAEROBIC Blood Culture adequate volume   Culture   Final    NO GROWTH 4 DAYS Performed at [facility], [address] [place], [place] [address]    Report Status PENDING  Incomplete  Blood culture (routine x 2)     Status: None (Preliminary result)   Collection Time: 10/12/17  8:37 PM  Result Value Ref Range Status   Specimen Description BLOOD SITE NOT SPECIFIED  Final   Special Requests   Final    BOTTLES DRAWN AEROBIC AND ANAEROBIC Blood Culture results may not be optimal due to an excessive volume of blood received in culture bottles   Culture   Final    NO GROWTH 4 DAYS Performed at [facility], [place] [address], [place], [place] [address]    Report Status PENDING  Incomplete  Respiratory Panel by PCR     Status: None   Collection Time: 10/13/17  8:40 AM  Result Value Ref Range Status   Adenovirus NOT DETECTED NOT DETECTED Final   Coronavirus 229E NOT DETECTED NOT DETECTED Final   Coronavirus HKU1 NOT DETECTED NOT DETECTED Final   Coronavirus NL63 NOT DETECTED NOT DETECTED Final   Coronavirus OC43 NOT DETECTED NOT DETECTED Final   Metapneumovirus NOT DETECTED NOT DETECTED Final   Rhinovirus / Enterovirus NOT DETECTED NOT DETECTED Final   Influenza A NOT DETECTED NOT DETECTED Final   Influenza B NOT DETECTED NOT DETECTED Final   Parainfluenza Virus 1 NOT DETECTED NOT DETECTED Final   Parainfluenza Virus 2 NOT DETECTED NOT DETECTED Final   Parainfluenza Virus 3 NOT DETECTED NOT DETECTED Final   Parainfluenza  Virus 4 NOT DETECTED NOT DETECTED Final   Respiratory Syncytial Virus NOT DETECTED NOT DETECTED Final   Bordetella pertussis NOT DETECTED NOT DETECTED Final   Chlamydophila pneumoniae NOT DETECTED NOT DETECTED Final   Mycoplasma pneumoniae NOT DETECTED NOT DETECTED Final    Comment: Performed at [facility], [place] [address] [place], [place] [address]  Culture, sputum-assessment  Status: None   Collection Time: 10/14/17 12:30 PM  Result Value Ref Range Status   Specimen Description EXPECTORATED SPUTUM  Final   Special Requests NONE  Final   Sputum evaluation   Final    Sputum specimen not acceptable for testing.  Please recollect.   Results Called to: .C. OKEEFE, RN AT 1616 ON 10/14/17 BY C. JESSUP, MLT. Performed at [facility], [place] [address] [place], [place] [address]    Report Status 10/14/2017 FINAL  Final  Respiratory Panel by PCR     Status: None   Collection Time: 10/14/17  3:59 PM  Result Value Ref Range Status   Adenovirus NOT DETECTED NOT DETECTED Final   Coronavirus 229E NOT DETECTED NOT DETECTED Final   Coronavirus HKU1 NOT DETECTED NOT DETECTED Final   Coronavirus NL63 NOT DETECTED NOT DETECTED Final   Coronavirus OC43 NOT DETECTED NOT DETECTED Final   Metapneumovirus NOT DETECTED NOT DETECTED Final   Rhinovirus / Enterovirus NOT DETECTED NOT DETECTED Final   Influenza A NOT DETECTED NOT DETECTED Final   Influenza B NOT DETECTED NOT DETECTED Final   Parainfluenza Virus 1 NOT DETECTED NOT DETECTED Final   Parainfluenza Virus 2 NOT DETECTED NOT DETECTED Final   Parainfluenza Virus 3 NOT DETECTED NOT DETECTED Final   Parainfluenza Virus 4 NOT DETECTED NOT DETECTED Final   Respiratory Syncytial Virus NOT DETECTED NOT DETECTED Final   Bordetella pertussis NOT DETECTED NOT DETECTED Final   Chlamydophila pneumoniae NOT DETECTED NOT DETECTED Final   Mycoplasma pneumoniae NOT DETECTED NOT DETECTED Final    Comment: Performed at [facility], [place]  [address], [place], [place] [address]  MRSA PCR Screening     Status: None   Collection Time: 10/15/17 11:43 AM  Result Value Ref Range Status   MRSA by PCR NEGATIVE NEGATIVE Final    Comment:        The GeneXpert MRSA Assay (FDA approved for NASAL specimens only), is one component of a comprehensive MRSA colonization surveillance program. It is not intended to diagnose MRSA infection nor to guide or monitor treatment for MRSA infections. Performed at [facility], [place] [address], [place], [place] [address]   Culture, respiratory (non-expectorated)     Status: None (Preliminary result)   Collection Time: 10/16/17  1:20 PM  Result Value Ref Range Status   Specimen Description BRONCHIAL ALVEOLAR LAVAGE  Final   Special Requests NONE  Final   Gram Stain   Final    RARE WBC PRESENT, PREDOMINANTLY MONONUCLEAR NO ORGANISMS SEEN    Culture   Final    NO GROWTH < 24 HOURS Performed at [facility], [place] [address] [place], [place] [address]    Report Status PENDING  Incomplete    Studies/Results: Dg Chest Port 1 View  Result Date: 10/17/2017 CLINICAL DATA:  Acute respiratory failure.  Shortness of breath. EXAM: PORTABLE CHEST 1 VIEW COMPARISON:  10/16/2017 and prior radiographs FINDINGS: An endotracheal tube is again identified with tip 1.8 cm above the carina. An NG tube enters the stomach. Bilateral airspace opacities are unchanged. No large pleural effusion or pneumothorax. IMPRESSION: Unchanged appearance the chest with bilateral airspace opacities and support apparatus. Electronically Signed   By: Margarette Canada M.D.   On: 10/17/2017 07:10   Dg Chest Port 1 View  Result Date: 10/16/2017 CLINICAL DATA:  Airway intubation EXAM: PORTABLE CHEST 1 VIEW COMPARISON:  10/15/2017 FINDINGS: New endotracheal tube with tip between the clavicular  heads and carina. Tube tip is approximately 2.5 cm above the carina. An orogastric tube is coiled in the stomach. Bilateral airspace disease. No  Kerley lines, effusion, or pneumothorax. Normal heart size. IMPRESSION: 1. New endotracheal and orogastric tubes in good position. 2. Symmetric extensive airspace disease suspicious for ARDS, possibly from underlying pneumonia or acute interstitial pneumonia. Electronically Signed   By: Monte Fantasia M.D.   On: 10/16/2017 13:32   [place] Ekg Site Rite  Result Date: 10/17/2017 If Site Rite image not attached, placement could not be confirmed due to current cardiac rhythm.    Assessment/Plan: CAP VDRF (intubated on 8-11) ARDS  Total days of antibiotics: 2 vanco/unsayn  HIV (-) Her AFB smear is pending.  Her BAL is ngtd for 24h. Smear negative for organisms.  Resp Virus Panel (-) quantiferon pending.  She continues to have fever, CXR is unchanged.  No change in her anbx for now, query if she is being driven by ARDS now.          Bobby Rumpf MD, FACP Infectious Diseases (pager) [phone] [email or web address] 10/17/2017, 9:29 AM  LOS: 3 days

## 2017-10-17 NOTE — Consult Note (Signed)
PULMONARY / CRITICAL CARE MEDICINE   Name: Stephanie Young MRN: [id number] DOB: Jul 10, 1990    ADMISSION DATE:  10/12/2017 CONSULTATION DATE:  10/16/17  REFERRING MD: Dr. Wynelle [place]  CHIEF COMPLAINT:  Worsening dyspnea  Brief : Stephanie Young is a 27 y.o. female without significant past medical history, who presents with a cough, shortness of breath and chest pain.  Patient states that she has been having cough, shortness of breath and chest pain for almost 4 days. She coughs up clear mucus. Her chest pain is located in the frontal chest, sharp, mild, nonradiating, pleuritic, aggravated by deep breath and coughing. Patient has a subjective fever and chills. She states she has nausea and vomited several times, which has resolved. Currently patient does not have nausea, vomiting, diarrhea or abdominal pain. No symptoms of UTI. No unilateral weakness.  ED Course: pt was found to have  WBC 16.8, lactic acid 0.84, negative pregnancy test, potassium 3.3, creatinine normal, temperature 99.7, tachycardia, tachypnea, O2 sat are 96% on room air. CT angiogram is negative for PE, but showed multifocal pneumonia. Patient is placed on telemetry bed for observation.  8/11  I was asked to see the patient today for worsening respiratory distress.  She appears quite tired and has increased work of breathing. Has a temp today of 100.7 The patient has a Pa02 of 57 on a NRM. Her recent CT scan chest shows multifocal infitlrates Her urine for Strep and Legionella was negative. Her MRSA nasal spec was negative for MRSA. Her CXR appears much worse today than iot did on 8/7. WBC is 16K and Procal 0.56. The patient has no hitory of recreational drug use, recent travel. Had a negative HIV test.The patient is on no immunosuppressive therapy.  She does not smoke. Ha s most recently been on Azithromax, Unasyn and Vanco. The patient has been seen by ID. Gyn bateria - negative RVP  negative   SUBJECTIVE/OVERNIGHT/INTERVAL HX   10/17/2017 - 416mg fent + 80 diprivan + 0.541mversed. -> arousable but then desatus easy. Follows commands. On vent. S/p bronch yesterday -> 49% polys, 26% lymphs, 40% fio2, peep 10, -Significant vent dysnchroncy yesterday is improved.   [place] RN [place]   VITAL SIGNS: BP (!) 101/44 (BP Location: Right Arm)   Pulse (!) 108   Temp (!) 101.3 F (38.5 C) (Oral)   Resp 18   Ht _0  (1.651 m)   Wt 71.2 kg   SpO2 95%   BMI 26.12 kg/m         Vent Mode: PRVC FiO2 (%):  [40 %-100 %] 40 % Set Rate:  [18 bmp] 18 bmp Vt Set:  [460 mL] 460 mL PEEP:  [10 cmH20] 10 cmH20 Plateau Pressure:  [21 cmH20-23 cmH20] 23 cmH20INTAKE / OUTPUT: I/O last 3 completed shifts: In: 5688.1 [P.O.:240; I.V.:2849.5; Other:275; IV Piggyback:2323.6] Out: 1050 [Urine:800; Emesis/NG output:250]  PHYSICAL EXAMINATION:  General Appearance:    Looks criticall il  Head:    Normocephalic, without obvious abnormality, atraumatic  Eyes:    PERRL - yes, conjunctiva/corneas - clear      Ears:    Normal external ear canals, both ears  Nose:   NG tube - no  Throat:  ETT TUBE - yes , OG tube - yes  Neck:   Supple,  No enlargement/tenderness/nodules     Lungs:     Distant crackles Ventilator  Synchrony -  uyes  Chest wall:    No deformity  Heart:    S1 and S2 normal, no murmur, CVP - no.  Pressors - no  Abdomen:     Soft, no masses, no organomegaly  Genitalia:    Not done  Rectal:   not done  Extremities:   Extremities- intact .mild edema +     Skin:   Intact in exposed areas . Sacral area - intact     Neurologic:   Sedation - gtt as above -> RASS - -4 . Moves all 4s - yes. CAM-ICU - na . Orientation - na        PULMONARY Recent Labs  Lab 10/16/17 1150 10/16/17 1458 10/17/17 0332 10/17/17 0348  PHART 7.427 7.382 7.698* 7.377  PCO2ART 40.7 45.5  16.4* 43.7  PO2ART 56.7* 248* <15* 74.0*  HCO3 26.4 26.5 26.0 25.4  TCO2  --   --  27 27  O2SAT 89.7 99.4 93.0 93.0    CBC Recent Labs  Lab 10/14/17 0433 10/15/17 0414 10/17/17 0347  HGB 9.9* 9.4* 9.5*  HCT 30.9* 29.9* 30.2*  WBC 13.4* 16.6* 14.6*  PLT 342 355 350    COAGULATION No results for input(s): INR in the last 168 hours.  CARDIAC  No results for input(s): TROPONINI in the last 168 hours. No results for input(s): PROBNP in the last 168 hours.   CHEMISTRY Recent Labs  Lab 10/12/17 1643 10/14/17 0433 10/17/17 0347  NA 135 139 140  K 3.3* 3.5 3.0*  CL 101 105 103  CO2 21* 21* 28  GLUCOSE 109* 111* 95  BUN 6 <5* 6  CREATININE 0.60 0.68 0.68  CALCIUM 9.3 8.7* 8.3*  MG  --   --  1.9  PHOS  --   --  3.5   Estimated Creatinine Clearance: 105.5 mL/min (by C-G formula based on SCr of 0.68 mg/dL).   LIVER Recent Labs  Lab 10/12/17 1911  AST 22  ALT 17  ALKPHOS 66  BILITOT 1.4*  PROT 8.0  ALBUMIN 3.7     INFECTIOUS Recent Labs  Lab 10/12/17 2121 10/13/17 0059  LATICACIDVEN 0.84 0.8  PROCALCITON  --  0.56     ENDOCRINE CBG (last 3)  Recent Labs    10/16/17 1934 10/16/17 2359 10/17/17 0423  GLUCAP 98 107* 80         IMAGING x48h  - image(s) personally visualized  -   highlighted in bold Dg Chest Port 1 View  Result Date: 10/17/2017 CLINICAL DATA:  Acute respiratory failure.  Shortness of breath. EXAM: PORTABLE CHEST 1 VIEW COMPARISON:  10/16/2017 and prior radiographs FINDINGS: An endotracheal tube is again identified with tip 1.8 cm above the carina. An NG tube enters the stomach. Bilateral airspace opacities are unchanged. No large pleural effusion or pneumothorax. IMPRESSION: Unchanged appearance the chest with bilateral airspace opacities and support apparatus. Electronically Signed   By: Margarette Canada M.D.   On: 10/17/2017 07:10   Dg Chest Port 1 View  Result Date: 10/16/2017 CLINICAL DATA:  Airway intubation EXAM: PORTABLE  CHEST 1 VIEW COMPARISON:  10/15/2017 FINDINGS: New endotracheal tube with tip between the clavicular heads and carina. Tube tip is approximately 2.5 cm above the carina. An orogastric tube is coiled in the stomach. Bilateral airspace disease. No Kerley lines, effusion, or pneumothorax. Normal heart size. IMPRESSION: 1. New endotracheal and orogastric tubes in good position. 2. Symmetric extensive airspace disease suspicious for ARDS, possibly from underlying pneumonia or  acute interstitial pneumonia. Electronically Signed   By: Monte Fantasia M.D.   On: 10/16/2017 13:32       ASSESSMENT / PLAN:  PULMONARY/INFX   A: ARDS 10/16/17 with acute hypopxemic resp failure   - 10/17/2017 - significantly better but needing 40% fio2, peep 10 and sync with vent  Plan Await rest of micro  - see ID Full vent support - not formaly on ARDS protocol Get ECHO   CARDIOVASCULAR  A - 10/17/2017 - nil acute  P Get echo PLACE PICC  RENAL  A - normal renal function but clinically volume overloaded 10/17/2017. Low K and Low Mag +  P - lasix x1  - kvo fluids  - replete  k and mag  HEME Recent Labs  Lab 10/14/17 0433 10/15/17 0414 10/17/17 0347  HGB 9.9* 9.4* 9.5*  HCT 30.9* 29.9* 30.2*  WBC 13.4* 16.6* 14.6*  PLT 342 355 350    A anemia of criitcal illness  P lovneoix dvt proph + - PRBC for hgb </= 6.9gm%    - exceptions are   -  if ACS susepcted/confirmed then transfuse for hgb </= 8.0gm%,  or    -  active bleeding with hemodynamic instability, then transfuse regardless of hemoglobin value   At at all times try to transfuse 1 unit prbc as possible with exception of active hemorrhage    GI  A: Not on PPI  P Start PPI Start TF  ID  A Presented with infectious prodrome  - 10/18/17 -no organizm identified yet  HIV negative  p - check aautoimmune and vasculitis pnale  =-check quant gold - await BAL cultures   ENDO A No hx of DM  P ICU hyperglycemia  protocol  CNS  A: in need of deep sedation due to aRDS. Does not meet PF criteria for Nimbex as of 10/17/2017  P - RASS goal -4 - vent synch goal needed  - continue diprivan but c heck CK, lactate 10/18/17 -  cotnt fentt gtt - increase versed gtt       FAMILY  Mom not at bedside 10/17/2017. Boyfriend not at bedside 10/17/2017      The patient is critically ill with multiple organ systems failure and requires high complexity decision making for assessment and support, frequent evaluation and titration of therapies, application of advanced monitoring technologies and extensive interpretation of multiple databases.   Critical Care Time devoted to patient care services described in this note is  30  Minutes. This time reflects time of care of this signee Dr Brand Males. This critical care time does not reflect procedure time, or teaching time or supervisory time of PA/NP/Med student/Med Resident etc but could involve care discussion time    Dr. Brand Males, M.D., [facility].C.P Pulmonary and Critical Care Medicine Staff Physician [facility] Pager: [phone], If no answer or between  15:00h - 7:00h: call [phone]  10/17/2017 9:21 AM

## 2017-10-17 NOTE — Progress Notes (Signed)
[facility] FOR [facility] LAB REPLACEMENT ONLY  The patient does [facility] based on the criteria listed below:   1. Is GFR >/= 40 ml/min? Yes.    Patient's GFR today is >60 2. Is urine output >/= 0.5 ml/kg/hr for the last 6 hours? Yes.   Patient's UOP is 1.0 ml/kg/hr 3. Is BUN < 60 mg/dL? Yes.    Patient's BUN today is 6 4. Abnormal electrolyte(s): K==3.0 5. Ordered repletion with: per protocol 6. If a panic level lab has been reported, has the CCM MD in charge been notified? Yes.  .   Physician:  Dian SituSommer,Steve MD  Hurman HornStophel, Kota Ciancio Parker 10/17/2017 7:07 AM

## 2017-10-17 NOTE — Progress Notes (Signed)
Peripherally Inserted Central Catheter/Midline Placement  The IV Nurse has discussed with the patient and/or persons authorized to consent for the patient, the purpose of this procedure and the potential benefits and risks involved with this procedure.  The benefits include less needle sticks, lab draws from the catheter, and the patient may be discharged home with the catheter. Risks include, but not limited to, infection, bleeding, blood clot (thrombus formation), and puncture of an artery; nerve damage and irregular heartbeat and possibility to perform a PICC exchange if needed/ordered by physician.  Alternatives to this procedure were also discussed.  Bard Power PICC patient education guide, fact sheet on infection prevention and patient information card has been provided to patient /or left at bedside.    PICC/Midline Placement Documentation  PICC Double Lumen 10/17/17 PICC Right Basilic 39 cm 0 cm (Active)  Indication for Insertion or Continuance of Line Prolonged intravenous therapies 10/17/2017 11:02 AM  Exposed Catheter (cm) 0 cm 10/17/2017 11:02 AM  Site Assessment Clean;Dry;Intact 10/17/2017 11:02 AM  Lumen #1 Status Flushed;Blood return noted 10/17/2017 11:02 AM  Lumen #2 Status Flushed;Blood return noted 10/17/2017 11:02 AM  Dressing Type Transparent 10/17/2017 11:02 AM  Dressing Status Clean;Dry;Intact;Antimicrobial disc in place 10/17/2017 11:02 AM  Dressing Intervention New dressing 10/17/2017 11:02 AM  Dressing Change Due 10/24/17 10/17/2017 11:02 AM   Telephone consent signed by Gweneth Frittermotherr    Reginia FortsLumban, Gideon Burstein Albarece 10/17/2017, 11:04 AM

## 2017-10-17 NOTE — Progress Notes (Signed)
Per Dr. Ninetta LightsHatcher OK to place PICC line.

## 2017-10-17 NOTE — Progress Notes (Addendum)
Pharmacy Antibiotic Note  Stephanie SchneidersJuliana Young is a 27 y.o. female admitted on 10/12/2017 with pneumonia/ARDS.  Pharmacy has been consulted for vancomycin dosing. Vancomycin trough was subtherapeutic at 9 today on vancomycin 1g q8h. Gave additional 500mg  dose after the 1300 dose today. WBC 14.6, Scr 0.68, Tmax 102.1  Plan: Increase vancomycin 1500mg  IV Q8H Goal trough 15-20 mcg/mL Follow renal function, labs, cultures, and vancomycin troughs as needed.  Height: 5\' 5"  (165.1 cm) Weight: 156 lb 15.5 oz (71.2 kg) IBW/kg (Calculated) : 57  Temp (24hrs), Avg:100 F (37.8 C), Min:98.3 F (36.8 C), Max:102.1 F (38.9 C)  Recent Labs  Lab 10/12/17 1643 10/12/17 2121 10/13/17 0059 10/14/17 0433 10/15/17 0414 10/17/17 0347 10/17/17 1244  WBC 16.8*  --   --  13.4* 16.6* 14.6*  --   CREATININE 0.60  --   --  0.68  --  0.68  --   LATICACIDVEN  --  0.84 0.8  --   --   --   --   VANCOTROUGH  --   --   --   --   --   --  9*    Estimated Creatinine Clearance: 105.5 mL/min (by C-G formula based on SCr of 0.68 mg/dL).    No Known Allergies  Antimicrobials this admission: Azith  8/8>>8/11 Rocephin 8/8>>8/9 8/10 Vancomycin >>  8/9 ampicillin/sulbactam >>   Dose adjustments this admission:   Microbiology results: 8/10 MRSA >> negative 8/8 RSV neg 8/7 BCx >> NGTD  Thank you for allowing pharmacy to be a part of this patient's care.  Ewing Schleinolton Demarr Kluever, PharmD PGY1 Pharmacy Resident Direct Phone [phone] 10/17/2017    3:51 PM

## 2017-10-18 ENCOUNTER — Inpatient Hospital Stay (HOSPITAL_COMMUNITY): Payer: Self-pay

## 2017-10-18 ENCOUNTER — Inpatient Hospital Stay: Payer: Self-pay | Admitting: Nurse Practitioner

## 2017-10-18 DIAGNOSIS — J181 Lobar pneumonia, unspecified organism: Secondary | ICD-10-CM

## 2017-10-18 DIAGNOSIS — J9601 Acute respiratory failure with hypoxia: Secondary | ICD-10-CM

## 2017-10-18 LAB — HEPATIC FUNCTION PANEL
ALBUMIN: 1.6 g/dL — AB (ref 3.5–5.0)
ALT: 13 U/L (ref 0–44)
AST: 20 U/L (ref 15–41)
Alkaline Phosphatase: 47 U/L (ref 38–126)
Bilirubin, Direct: 0.2 mg/dL (ref 0.0–0.2)
Indirect Bilirubin: 0.3 mg/dL (ref 0.3–0.9)
Total Bilirubin: 0.5 mg/dL (ref 0.3–1.2)
Total Protein: 5.4 g/dL — ABNORMAL LOW (ref 6.5–8.1)

## 2017-10-18 LAB — PHOSPHORUS: PHOSPHORUS: 3.6 mg/dL (ref 2.5–4.6)

## 2017-10-18 LAB — BASIC METABOLIC PANEL
Anion gap: 10 (ref 5–15)
BUN: 8 mg/dL (ref 6–20)
CALCIUM: 7.7 mg/dL — AB (ref 8.9–10.3)
CO2: 31 mmol/L (ref 22–32)
Chloride: 100 mmol/L (ref 98–111)
Creatinine, Ser: 0.53 mg/dL (ref 0.44–1.00)
GFR calc Af Amer: >60 mL/min (ref >60)
Glucose, Bld: 104 mg/dL — ABNORMAL HIGH (ref 70–99)
POTASSIUM: 3.1 mmol/L — AB (ref 3.5–5.1)
Sodium: 141 mmol/L (ref 135–145)

## 2017-10-18 LAB — GLUCOSE, CAPILLARY
GLUCOSE-CAPILLARY: 94 mg/dL (ref 70–99)
Glucose-Capillary: 103 mg/dL — ABNORMAL HIGH (ref 70–99)
Glucose-Capillary: 108 mg/dL — ABNORMAL HIGH (ref 70–99)
Glucose-Capillary: 98 mg/dL (ref 70–99)
Glucose-Capillary: 108 mg/dL — ABNORMAL HIGH (ref 70–99)

## 2017-10-18 LAB — MPO/PR-3 (ANCA) ANTIBODIES: Myeloperoxidase Abs: <9 U/mL (ref 0.0–9.0)

## 2017-10-18 LAB — ACID FAST SMEAR (AFB): ACID FAST SMEAR - AFSCU2: NEGATIVE

## 2017-10-18 LAB — CK TOTAL AND CKMB (NOT AT ARMC)
CK, MB: 2.4 ng/mL (ref 0.5–5.0)
RELATIVE INDEX: 0.8 (ref 0.0–2.5)
Total CK: 306 U/L — ABNORMAL HIGH (ref 38–234)

## 2017-10-18 LAB — CBC
HEMATOCRIT: 28 % — AB (ref 36.0–46.0)
HEMOGLOBIN: 8.7 g/dL — AB (ref 12.0–15.0)
MCH: 27.6 pg (ref 26.0–34.0)
MCHC: 31.1 g/dL (ref 30.0–36.0)
MCV: 88.9 fL (ref 78.0–100.0)
Platelets: 382 10*3/uL (ref 150–400)
RBC: 3.15 MIL/uL — AB (ref 3.87–5.11)
RDW: 14.2 % (ref 11.5–15.5)
WBC: 11.5 10*3/uL — ABNORMAL HIGH (ref 4.0–10.5)

## 2017-10-18 LAB — MAGNESIUM: MAGNESIUM: 2 mg/dL (ref 1.7–2.4)

## 2017-10-18 LAB — CULTURE, RESPIRATORY W GRAM STAIN: Culture: NO GROWTH

## 2017-10-18 LAB — ANTI-DNA ANTIBODY, DOUBLE-STRANDED: DS DNA AB: 5 [IU]/mL (ref 0–9)

## 2017-10-18 LAB — LIPASE, BLOOD: Lipase: 25 U/L (ref 11–51)

## 2017-10-18 LAB — CULTURE, RESPIRATORY

## 2017-10-18 LAB — ANTINUCLEAR ANTIBODIES, IFA: ANTINUCLEAR ANTIBODIES, IFA: NEGATIVE

## 2017-10-18 LAB — ECHOCARDIOGRAM COMPLETE
Height: 65 in
Weight: 2811.31 oz

## 2017-10-18 LAB — RHEUMATOID FACTOR: RHEUMATOID FACTOR: 11.9 [IU]/mL (ref 0.0–13.9)

## 2017-10-18 LAB — LACTIC ACID, PLASMA: LACTIC ACID, VENOUS: 0.9 mmol/L (ref 0.5–1.9)

## 2017-10-18 LAB — PATHOLOGIST SMEAR REVIEW

## 2017-10-18 LAB — ANTI-SCLERODERMA ANTIBODY: Scleroderma (Scl-70) (ENA) Antibody, IgG: <0.2 AI (ref 0.0–0.9)

## 2017-10-18 MED ORDER — FUROSEMIDE 10 MG/ML IJ SOLN
40.0000 mg | Freq: Three times a day (TID) | INTRAMUSCULAR | Status: DC
  Administered 2017-10-18 (×3): 40 mg via INTRAVENOUS
  Filled 2017-10-18 (×3): qty 4

## 2017-10-18 MED ORDER — POTASSIUM CHLORIDE 20 MEQ/15ML (10%) PO SOLN
60.0000 meq | Freq: Once | ORAL | Status: AC
  Administered 2017-10-18: 60 meq
  Filled 2017-10-18: qty 45

## 2017-10-18 MED ORDER — POTASSIUM CHLORIDE 20 MEQ PO PACK
40.0000 meq | PACK | Freq: Two times a day (BID) | ORAL | Status: DC
  Filled 2017-10-18: qty 2

## 2017-10-18 MED ORDER — POTASSIUM CHLORIDE 20 MEQ/15ML (10%) PO SOLN
40.0000 meq | Freq: Two times a day (BID) | ORAL | Status: DC
  Administered 2017-10-18: 40 meq
  Filled 2017-10-18 (×2): qty 30

## 2017-10-18 NOTE — Progress Notes (Signed)
INFECTIOUS DISEASE PROGRESS NOTE  ID: Stephanie Young is a 27 y.o. female with  Principal Problem:   Multifocal pneumonia Active Problems:   Hypokalemia   Sepsis ([place])   Lobar pneumonia ([place])   Acute respiratory failure with hypoxia ([place])   ARDS (adult respiratory distress syndrome) ([place])  Subjective: On vent  Abtx:  Anti-infectives (From admission, onward)   Start     Dose/Rate Route Frequency Ordered Stop   10/17/17 2330  vancomycin (VANCOCIN) 1,500 mg in sodium chloride 0.9 % 500 mL IVPB     1,500 mg 250 mL/hr over 120 Minutes Intravenous Every 8 hours 10/17/17 1553     10/17/17 1415  vancomycin (VANCOCIN) 500 mg in sodium chloride 0.9 % 100 mL IVPB     500 mg 100 mL/hr over 60 Minutes Intravenous NOW 10/17/17 1414 10/17/17 1620   10/16/17 1330  vancomycin (VANCOCIN) IVPB 1000 mg/200 mL premix  Status:  Discontinued     1,000 mg 200 mL/hr over 60 Minutes Intravenous Every 8 hours 10/16/17 1253 10/17/17 1553   10/16/17 0000  vancomycin (VANCOCIN) IVPB 1000 mg/200 mL premix  Status:  Discontinued     1,000 mg 200 mL/hr over 60 Minutes Intravenous Every 12 hours 10/15/17 1132 10/15/17 1426   10/16/17 0000  vancomycin (VANCOCIN) IVPB 1000 mg/200 mL premix  Status:  Discontinued     1,000 mg 200 mL/hr over 60 Minutes Intravenous Every 12 hours 10/15/17 1451 10/16/17 1253   10/15/17 1200  vancomycin (VANCOCIN) 1,500 mg in sodium chloride 0.9 % 500 mL IVPB     1,500 mg 250 mL/hr over 120 Minutes Intravenous  Once 10/15/17 1132 10/16/17 1320   10/15/17 1000  levofloxacin (LEVAQUIN) IVPB 750 mg  Status:  Discontinued     750 mg 100 mL/hr over 90 Minutes Intravenous Every 24 hours 10/15/17 0954 10/15/17 1001   10/14/17 1530  Ampicillin-Sulbactam (UNASYN) 3 g in sodium chloride 0.9 % 100 mL IVPB     3 g 200 mL/hr over 30 Minutes Intravenous Every 6 hours 10/14/17 1525     10/13/17 2000  levofloxacin (LEVAQUIN) IVPB 750 mg  Status:  Discontinued     750 mg 100 mL/hr over 90  Minutes Intravenous  Once 10/13/17 0257 10/13/17 0752   10/13/17 2000  levofloxacin (LEVAQUIN) IVPB 750 mg  Status:  Discontinued     750 mg 100 mL/hr over 90 Minutes Intravenous Every 24 hours 10/13/17 0752 10/13/17 0817   10/13/17 2000  azithromycin (ZITHROMAX) 500 mg in sodium chloride 0.9 % 250 mL IVPB  Status:  Discontinued     500 mg 250 mL/hr over 60 Minutes Intravenous Every 24 hours 10/13/17 0817 10/16/17 1252   10/13/17 1300  cefTRIAXone (ROCEPHIN) 1 g in sodium chloride 0.9 % 100 mL IVPB  Status:  Discontinued     1 g 200 mL/hr over 30 Minutes Intravenous Every 24 hours 10/13/17 1258 10/14/17 1520   10/13/17 1000  cefTRIAXone (ROCEPHIN) 1 g in sodium chloride 0.9 % 100 mL IVPB  Status:  Discontinued     1 g 200 mL/hr over 30 Minutes Intravenous Every 24 hours 10/13/17 0817 10/13/17 1259   10/12/17 2030  levofloxacin (LEVAQUIN) IVPB 750 mg     750 mg 100 mL/hr over 90 Minutes Intravenous  Once 10/12/17 2017 10/12/17 2252      Medications:  Scheduled: . albuterol  2.5 mg Nebulization Q4H  . chlorhexidine gluconate (MEDLINE KIT)  15 mL Mouth Rinse BID  . Chlorhexidine Gluconate Cloth  6 each Topical Daily  . enoxaparin (LOVENOX) injection  40 mg Subcutaneous Daily  . feeding supplement (PRO-STAT SUGAR FREE 64)  30 mL Per Tube TID  . free water  100 mL Per Tube Q4H  . furosemide  40 mg Intravenous TID  . insulin aspart  2-6 Units Subcutaneous Q4H  . mouth rinse  15 mL Mouth Rinse 10 times per day  . multivitamin  15 mL Per Tube Daily  . pantoprazole sodium  40 mg Per Tube Daily  . potassium chloride  40 mEq Per Tube BID  . sodium chloride flush  10-40 mL Intracatheter Q12H    Objective: Vital signs in last 24 hours: Temp:  [98.3 F (36.8 C)-100.6 F (38.1 C)] 100.6 F (38.1 C) (08/13 1000) Pulse Rate:  [102-110] 110 (08/13 0401) Resp:  [14-21] 19 (08/13 1030) BP: (94-121)/(51-73) 113/71 (08/13 1030) SpO2:  [84 %-100 %] 96 % (08/13 1030) FiO2 (%):  [40 %] 40 %  (08/13 0712) Weight:  [79.7 kg] 79.7 kg (08/13 0433)   General appearance: no distress Resp: rhonchi anterior - bilateral Cardio: regular rate and rhythm GI: normal findings: bowel sounds normal and soft, non-tender Extremities: edema none. no rash LE  Lab Results Recent Labs    10/17/17 0347 10/18/17 0455  WBC 14.6* 11.5*  HGB 9.5* 8.7*  HCT 30.2* 28.0*  NA 140 141  K 3.0* 3.1*  CL 103 100  CO2 28 31  BUN 6 8  CREATININE 0.68 0.53   Liver Panel Recent Labs    10/18/17 0455  PROT 5.4*  ALBUMIN 1.6*  AST 20  ALT 13  ALKPHOS 47  BILITOT 0.5  BILIDIR 0.2  IBILI 0.3   Sedimentation Rate No results for input(s): ESRSEDRATE in the last 72 hours. C-Reactive Protein No results for input(s): CRP in the last 72 hours.  Microbiology: Recent Results (from the past 240 hour(s))  Blood culture (routine x 2)     Status: None   Collection Time: 10/12/17  8:20 PM  Result Value Ref Range Status   Specimen Description BLOOD RIGHT ANTECUBITAL  Final   Special Requests   Final    BOTTLES DRAWN AEROBIC AND ANAEROBIC Blood Culture adequate volume   Culture   Final    NO GROWTH 5 DAYS Performed at [facility], [address] [place], [place] [address]    Report Status 10/17/2017 FINAL  Final  Blood culture (routine x 2)     Status: None   Collection Time: 10/12/17  8:37 PM  Result Value Ref Range Status   Specimen Description BLOOD SITE NOT SPECIFIED  Final   Special Requests   Final    BOTTLES DRAWN AEROBIC AND ANAEROBIC Blood Culture results may not be optimal due to an excessive volume of blood received in culture bottles   Culture   Final    NO GROWTH 5 DAYS Performed at [facility], [place] [address] [place], [place] [address]    Report Status 10/17/2017 FINAL  Final  Respiratory Panel by PCR     Status: None   Collection Time: 10/13/17  8:40 AM  Result Value Ref Range Status   Adenovirus NOT DETECTED NOT DETECTED Final   Coronavirus 229E NOT DETECTED  NOT DETECTED Final   Coronavirus HKU1 NOT DETECTED NOT DETECTED Final   Coronavirus NL63 NOT DETECTED NOT DETECTED Final   Coronavirus OC43 NOT DETECTED NOT DETECTED Final   Metapneumovirus NOT DETECTED NOT DETECTED Final   Rhinovirus /  Enterovirus NOT DETECTED NOT DETECTED Final   Influenza A NOT DETECTED NOT DETECTED Final   Influenza B NOT DETECTED NOT DETECTED Final   Parainfluenza Virus 1 NOT DETECTED NOT DETECTED Final   Parainfluenza Virus 2 NOT DETECTED NOT DETECTED Final   Parainfluenza Virus 3 NOT DETECTED NOT DETECTED Final   Parainfluenza Virus 4 NOT DETECTED NOT DETECTED Final   Respiratory Syncytial Virus NOT DETECTED NOT DETECTED Final   Bordetella pertussis NOT DETECTED NOT DETECTED Final   Chlamydophila pneumoniae NOT DETECTED NOT DETECTED Final   Mycoplasma pneumoniae NOT DETECTED NOT DETECTED Final    Comment: Performed at [facility], [place] [address] [place], [place] [address]  Culture, sputum-assessment     Status: None   Collection Time: 10/14/17 12:30 PM  Result Value Ref Range Status   Specimen Description EXPECTORATED SPUTUM  Final   Special Requests NONE  Final   Sputum evaluation   Final    Sputum specimen not acceptable for testing.  Please recollect.   Results Called to: .C. OKEEFE, RN AT 1616 ON 10/14/17 BY C. JESSUP, MLT. Performed at [facility], [place] [address] [place], [place] [address]    Report Status 10/14/2017 FINAL  Final  Respiratory Panel by PCR     Status: None   Collection Time: 10/14/17  3:59 PM  Result Value Ref Range Status   Adenovirus NOT DETECTED NOT DETECTED Final   Coronavirus 229E NOT DETECTED NOT DETECTED Final   Coronavirus HKU1 NOT DETECTED NOT DETECTED Final   Coronavirus NL63 NOT DETECTED NOT DETECTED Final   Coronavirus OC43 NOT DETECTED NOT DETECTED Final   Metapneumovirus NOT DETECTED NOT DETECTED Final   Rhinovirus / Enterovirus NOT DETECTED NOT DETECTED Final   Influenza A NOT DETECTED NOT DETECTED Final     Influenza B NOT DETECTED NOT DETECTED Final   Parainfluenza Virus 1 NOT DETECTED NOT DETECTED Final   Parainfluenza Virus 2 NOT DETECTED NOT DETECTED Final   Parainfluenza Virus 3 NOT DETECTED NOT DETECTED Final   Parainfluenza Virus 4 NOT DETECTED NOT DETECTED Final   Respiratory Syncytial Virus NOT DETECTED NOT DETECTED Final   Bordetella pertussis NOT DETECTED NOT DETECTED Final   Chlamydophila pneumoniae NOT DETECTED NOT DETECTED Final   Mycoplasma pneumoniae NOT DETECTED NOT DETECTED Final    Comment: Performed at [facility], [place] [address], [place], [place] [address]  MRSA PCR Screening     Status: None   Collection Time: 10/15/17 11:43 AM  Result Value Ref Range Status   MRSA by PCR NEGATIVE NEGATIVE Final    Comment:        The GeneXpert MRSA Assay (FDA approved for NASAL specimens only), is one component of a comprehensive MRSA colonization surveillance program. It is not intended to diagnose MRSA infection nor to guide or monitor treatment for MRSA infections. Performed at [facility], [place] [address], [place], [place] [address]   Culture, respiratory (non-expectorated)     Status: None (Preliminary result)   Collection Time: 10/16/17  1:20 PM  Result Value Ref Range Status   Specimen Description BRONCHIAL ALVEOLAR LAVAGE  Final   Special Requests NONE  Final   Gram Stain   Final    RARE WBC PRESENT, PREDOMINANTLY MONONUCLEAR NO ORGANISMS SEEN    Culture   Final    NO GROWTH 2 DAYS Performed at [facility], [place] [address], [place], [place] [address]    Report Status PENDING  Incomplete  Acid Fast Smear (AFB)     Status: None   Collection Time: 10/16/17  1:20 PM  Result Value Ref Range Status   AFB Specimen Processing Concentration  Final   Acid Fast Smear Negative  Final    Comment: (NOTE) Performed At: [facility] [place], [place] [id number] Rush Farmer MD CZ:[id number]    Source (AFB) BRONCHIAL ALVEOLAR  LAVAGE  Final    Comment: Performed at [facility], [place] [address], [place], [place] [address]    Studies/Results: Dg Chest Port 1 View  Result Date: 10/18/2017 CLINICAL DATA:  Intubation. EXAM: PORTABLE CHEST 1 VIEW COMPARISON:  10/17/2017. FINDINGS: Right PICC line noted with tip at the cavoatrial junction/upper right atrium. Endotracheal tube and NG tube in stable position. Heart size stable. Diffuse bilateral airspace disease again noted. Similar findings on prior exam. No pleural effusion or pneumothorax. IMPRESSION: 1. Right PICC line noted with tip at the cavoatrial junction/upper right atrium. 2.  Endotracheal tube and NG tube in stable position. 3. Bilateral diffuse airspace disease again noted. Similar findings noted on prior exam. Electronically Signed   By: [place]   On: 10/18/2017 06:18   Dg Chest Port 1 View  Result Date: 10/17/2017 CLINICAL DATA:  Acute respiratory failure.  Shortness of breath. EXAM: PORTABLE CHEST 1 VIEW COMPARISON:  10/16/2017 and prior radiographs FINDINGS: An endotracheal tube is again identified with tip 1.8 cm above the carina. An NG tube enters the stomach. Bilateral airspace opacities are unchanged. No large pleural effusion or pneumothorax. IMPRESSION: Unchanged appearance the chest with bilateral airspace opacities and support apparatus. Electronically Signed   By: Margarette Canada M.D.   On: 10/17/2017 07:10   Dg Chest Port 1 View  Result Date: 10/16/2017 CLINICAL DATA:  Airway intubation EXAM: PORTABLE CHEST 1 VIEW COMPARISON:  10/15/2017 FINDINGS: New endotracheal tube with tip between the clavicular heads and carina. Tube tip is approximately 2.5 cm above the carina. An orogastric tube is coiled in the stomach. Bilateral airspace disease. No Kerley lines, effusion, or pneumothorax. Normal heart size. IMPRESSION: 1. New endotracheal and orogastric tubes in good position. 2. Symmetric extensive airspace disease suspicious for ARDS, possibly from  underlying pneumonia or acute interstitial pneumonia. Electronically Signed   By: Monte Fantasia M.D.   On: 10/16/2017 13:32   [place] Ekg Site Rite  Result Date: 10/17/2017 If Site Rite image not attached, placement could not be confirmed due to current cardiac rhythm.    Assessment/Plan: CAP VDRF (intubated on 8-11) ARDS  Total days of antibiotics: 3 vanco/unasyn  tmax 100.6. WBC better (11.5 now) BAL ngtd BCx pending Legionella, quantiferon pending Continue her current anbx for now          Bobby Rumpf MD, FACP Infectious Diseases (pager) [phone] [email or web address] 10/18/2017, 10:37 AM  LOS: 4 days

## 2017-10-18 NOTE — Consult Note (Signed)
PULMONARY / CRITICAL CARE MEDICINE   Name: Stephanie Young MRN: [id number] DOB: 1991/02/11    ADMISSION DATE:  10/12/2017 CONSULTATION DATE:  10/16/17  REFERRING MD: Dr. Wynelle Cleveland  CHIEF COMPLAINT:  Worsening dyspnea  Brief : Stephanie Young is a 27 y.o. female without significant past medical history, who presents with a cough, shortness of breath and chest pain.  Patient states that she has been having cough, shortness of breath and chest pain for almost 4 days. She coughs up clear mucus. Her chest pain is located in the frontal chest, sharp, mild, nonradiating, pleuritic, aggravated by deep breath and coughing. Patient has a subjective fever and chills. She states she has nausea and vomited several times, which has resolved. Currently patient does not have nausea, vomiting, diarrhea or abdominal pain. No symptoms of UTI. No unilateral weakness.  ED Course: pt was found to have  WBC 16.8, lactic acid 0.84, negative pregnancy test, potassium 3.3, creatinine normal, temperature 99.7, tachycardia, tachypnea, O2 sat are 96% on room air. CT angiogram is negative for PE, but showed multifocal pneumonia. Patient is placed on telemetry bed for observation.  8/11  I was asked to see the patient today for worsening respiratory distress.  She appears quite tired and has increased work of breathing. Has a temp today of 100.7 The patient has a Pa02 of 57 on a NRM. Her recent CT scan chest shows multifocal infitlrates Her urine for Strep and Legionella was negative. Her MRSA nasal spec was negative for MRSA. Her CXR appears much worse today than iot did on 8/7. WBC is 16K and Procal 0.56. The patient has no hitory of recreational drug use, recent travel. Had a negative HIV test.The patient is on no immunosuppressive therapy.  She does not smoke. Ha s most recently been on Azithromax, Unasyn and Vanco. The patient has been seen by ID. Gyn bateria - negative RVP negative    10/17/2017 - 475mg fent +  80 diprivan + 0.529mversed. -> arousable but then desatus easy. Follows commands. On vent. S/p bronch yesterday -> 49% polys, 26% lymphs, 40% fio2, peep 10, -Significant vent dysnchroncy yesterday is improved.   Per RN [place]    SUBJECTIVE/OVERNIGHT/INTERVAL HX 10/18/17 - fentanyl, diprivan and versed gtt /. On Unasynm and Vanc. Fever curve trending down. CXR and pulse ox better after lasx x 1 yesterday . +17.6L since admit   VITAL SIGNS: BP (!) 108/57   Pulse (!) 110   Temp 99.7 F (37.6 C) (Oral)   Resp 18   Ht _0  (1.651 m)   Wt 79.7 kg   SpO2 99%   BMI 29.24 kg/m         Vent Mode: PRVC FiO2 (%):  [40 %] 40 % Set Rate:  [18 bmp] 18 bmp Vt Set:  [460 mL] 460 mL PEEP:  [10 cmH20] 10 cmH20 Plateau Pressure:  [22 cmH20-25 cmH20] 22 cmH20INTAKE / OUTPUT: I/O last 3 completed shifts: In: 7347.6 [I.V.:3688.5; Other:275; NG/GT:1261.3; IV Piggyback:2122.8] Out: 316256Urine:2755; Emesis/NG output:435]     General Appearance:    Looks criticall ill OBES E - NO  Head:    Normocephalic, without obvious abnormality, atraumatic  Eyes:    PERRL - yes, conjunctiva/corneas - clear      Ears:    Normal external ear canals, both ears  Nose:   NG tube - no  Throat:  ETT TUBE - yes , OG tube - yes with TF +  Neck:   Supple,  No enlargement/tenderness/nodules     Lungs:     Clear to auscultation bilaterally, Ventilator   Synch - +  Chest wall:    No deformity  Heart:    S1 and S2 normal, no murmur, CVP - no.  Pressors - no  Abdomen:     Soft, no masses, no organomegaly  Genitalia:    Not done  Rectal:   not done  Extremities:   Extremities- intact     Skin:   Intact in exposed areas . Sacral area - none     Neurologic:   Sedation - fent, diprivan, versed -> RASS - -4 . Moves all 4s - yes udring wua. CAM-ICU - na . Orientation - na        PULMONARY Recent Labs   Lab 10/16/17 1150 10/16/17 1458 10/17/17 0332 10/17/17 0348  PHART 7.427 7.382 7.698* 7.377  PCO2ART 40.7 45.5 16.4* 43.7  PO2ART 56.7* 248* <15* 74.0*  HCO3 26.4 26.5 26.0 25.4  TCO2  --   --  27 27  O2SAT 89.7 99.4 93.0 93.0    CBC Recent Labs  Lab 10/15/17 0414 10/17/17 0347 10/18/17 0455  HGB 9.4* 9.5* 8.7*  HCT 29.9* 30.2* 28.0*  WBC 16.6* 14.6* 11.5*  PLT 355 350 382    COAGULATION No results for input(s): INR in the last 168 hours.  CARDIAC  No results for input(s): TROPONINI in the last 168 hours. No results for input(s): PROBNP in the last 168 hours.   CHEMISTRY Recent Labs  Lab 10/12/17 1643 10/14/17 0433 10/17/17 0347 10/18/17 0455  NA 135 139 140 141  K 3.3* 3.5 3.0* 3.1*  CL 101 105 103 100  CO2 21* 21* 28 31  GLUCOSE 109* 111* 95 104*  BUN 6 <5* 6 8  CREATININE 0.60 0.68 0.68 0.53  CALCIUM 9.3 8.7* 8.3* 7.7*  MG  --   --  1.9 2.0  PHOS  --   --  3.5 3.6   Estimated Creatinine Clearance: 111.2 mL/min (by C-G formula based on SCr of 0.53 mg/dL).   LIVER Recent Labs  Lab 10/12/17 1911 10/18/17 0455  AST 22 20  ALT 17 13  ALKPHOS 66 47  BILITOT 1.4* 0.5  PROT 8.0 5.4*  ALBUMIN 3.7 1.6*     INFECTIOUS Recent Labs  Lab 10/12/17 2121 10/13/17 0059 10/18/17 0455  LATICACIDVEN 0.84 0.8 0.9  PROCALCITON  --  0.56  --      ENDOCRINE CBG (last 3)  Recent Labs    10/17/17 2327 10/18/17 0354 10/18/17 0755  GLUCAP 104* 103* 94         IMAGING x48h  - image(s) personally visualized  -   highlighted in bold Dg Chest Port 1 View  Result Date: 10/18/2017 CLINICAL DATA:  Intubation. EXAM: PORTABLE CHEST 1 VIEW COMPARISON:  10/17/2017. FINDINGS: Right PICC line noted with tip at the cavoatrial junction/upper right atrium. Endotracheal tube and NG tube in stable position. Heart size stable. Diffuse bilateral airspace disease again noted. Similar findings on prior exam. No pleural effusion or pneumothorax. IMPRESSION: 1. Right  PICC line noted with tip at the cavoatrial junction/upper right atrium. 2.  Endotracheal tube and NG tube in stable position. 3. Bilateral diffuse airspace disease again noted. Similar findings noted on prior exam. Electronically Signed   By: [place]   On: 10/18/2017 06:18   Dg Chest  Port 1 View  Result Date: 10/17/2017 CLINICAL DATA:  Acute respiratory failure.  Shortness of breath. EXAM: PORTABLE CHEST 1 VIEW COMPARISON:  10/16/2017 and prior radiographs FINDINGS: An endotracheal tube is again identified with tip 1.8 cm above the carina. An NG tube enters the stomach. Bilateral airspace opacities are unchanged. No large pleural effusion or pneumothorax. IMPRESSION: Unchanged appearance the chest with bilateral airspace opacities and support apparatus. Electronically Signed   By: Margarette Canada M.D.   On: 10/17/2017 07:10   Dg Chest Port 1 View  Result Date: 10/16/2017 CLINICAL DATA:  Airway intubation EXAM: PORTABLE CHEST 1 VIEW COMPARISON:  10/15/2017 FINDINGS: New endotracheal tube with tip between the clavicular heads and carina. Tube tip is approximately 2.5 cm above the carina. An orogastric tube is coiled in the stomach. Bilateral airspace disease. No Kerley lines, effusion, or pneumothorax. Normal heart size. IMPRESSION: 1. New endotracheal and orogastric tubes in good position. 2. Symmetric extensive airspace disease suspicious for ARDS, possibly from underlying pneumonia or acute interstitial pneumonia. Electronically Signed   By: Monte Fantasia M.D.   On: 10/16/2017 13:32   [place] Ekg Site Rite  Result Date: 10/17/2017 If Site Rite image not attached, placement could not be confirmed due to current cardiac rhythm.   Principal Problem:   Multifocal pneumonia Active Problems:   Hypokalemia   Sepsis ([place])   Lobar pneumonia ([place])   Acute respiratory failure with hypoxia ([facility])   ARDS (adult respiratory distress syndrome) ([facility])     ASSESSMENT / PLAN:  PULMONARY/INFX   A: ARDS  10/16/17 with acute hypopxemic resp failure   - 10/18/2017 -  Better acute resp failure with lasix  Plan Await rest of micro  - see ID Continue PRV C but time tio reduce peep Lasix (per FACTT data)   CARDIOVASCULAR  A - 10/18/2017 - nil acute s/p PICC 10/17/17  P aWait  echo Diurese  RENAL  A - normal renal function but clinically volume overloaded 10/18/2017. +17.6L since admit. Low K + P - start aggressive diuresis per FACTT trial  - replete K  HEME Recent Labs  Lab 10/15/17 0414 10/17/17 0347 10/18/17 0455  HGB 9.4* 9.5* 8.7*  HCT 29.9* 30.2* 28.0*  WBC 16.6* 14.6* 11.5*  PLT 355 350 382    A anemia of critical illness  P lovenox for dvt proph + - PRBC for hgb </= 6.9gm%    - exceptions are   -  if ACS susepcted/confirmed then transfuse for hgb </= 8.0gm%,  or    -  active bleeding with hemodynamic instability, then transfuse regardless of hemoglobin value   At at all times try to transfuse 1 unit prbc as possible with exception of active hemorrhage    GI  A: On TF  P PPI TF  ID  A Presented with infectious prodrome. RF negative 10/17/17  - - 10/18/17 - no microorg p -await rest of autoimmune profile and vasculitis profile - await BAL cultures   ENDO A No hx of DM  P ICU hyperglycemia protocol  CNS  A: in need of deep sedation due to aRDS. Does not meet PF criteria for Nimbex as of 10/18/2017   - 8/13 - RASS -4  P - RASS goal -2 ; time to lighten sedation gotl - vent synch goal needed  - dc diprivan -  cotnt fentt gtt - versed gtt       FAMILY  Mom not at bedside 10/18/2017. Boyfriend not at bedside 10/18/2017  The patient is critically ill with multiple organ systems failure and requires high complexity decision making for assessment and support, frequent evaluation and titration of therapies, application of advanced monitoring technologies and extensive interpretation of multiple databases.   Critical Care Time devoted  to patient care services described in this note is  30  Minutes. This time reflects time of care of this signee Dr Brand Males. This critical care time does not reflect procedure time, or teaching time or supervisory time of PA/NP/Med student/Med Resident etc but could involve care discussion time    Dr. Brand Males, M.D., [facility].C.P Pulmonary and Critical Care Medicine Staff Physician [facility] Pager: [phone], If no answer or between  15:00h - 7:00h: call [phone]  10/18/2017 9:25 AM

## 2017-10-18 NOTE — Progress Notes (Signed)
  Echocardiogram 2D Echocardiogram has been performed.  Delcie RochENNINGTON, Diahn Waidelich 10/18/2017, 4:54 PM

## 2017-10-19 ENCOUNTER — Inpatient Hospital Stay (HOSPITAL_COMMUNITY): Payer: Self-pay

## 2017-10-19 DIAGNOSIS — R451 Restlessness and agitation: Secondary | ICD-10-CM

## 2017-10-19 LAB — BODY FLUID CELL COUNT WITH DIFFERENTIAL
Lymphs, Fluid: 26 %
Monocyte-Macrophage-Serous Fluid: 25 % — ABNORMAL LOW (ref 50–90)
NEUTROPHIL FLUID: 49 % — AB (ref 0–25)
WBC FLUID: 350 uL (ref 0–1000)

## 2017-10-19 LAB — BASIC METABOLIC PANEL
ANION GAP: 7 (ref 5–15)
ANION GAP: 10 (ref 5–15)
BUN: 12 mg/dL (ref 6–20)
BUN: 9 mg/dL (ref 6–20)
CO2: 39 mmol/L — ABNORMAL HIGH (ref 22–32)
CO2: 39 mmol/L — ABNORMAL HIGH (ref 22–32)
Calcium: 7.8 mg/dL — ABNORMAL LOW (ref 8.9–10.3)
Calcium: 8 mg/dL — ABNORMAL LOW (ref 8.9–10.3)
Chloride: 93 mmol/L — ABNORMAL LOW (ref 98–111)
Chloride: 96 mmol/L — ABNORMAL LOW (ref 98–111)
Creatinine, Ser: 0.43 mg/dL — ABNORMAL LOW (ref 0.44–1.00)
Creatinine, Ser: 0.61 mg/dL (ref 0.44–1.00)
GFR calc Af Amer: >60 mL/min (ref >60)
Glucose, Bld: 112 mg/dL — ABNORMAL HIGH (ref 70–99)
Glucose, Bld: 125 mg/dL — ABNORMAL HIGH (ref 70–99)
POTASSIUM: 3.5 mmol/L (ref 3.5–5.1)
Potassium: 3.5 mmol/L (ref 3.5–5.1)
SODIUM: 142 mmol/L (ref 135–145)
SODIUM: 142 mmol/L (ref 135–145)

## 2017-10-19 LAB — POCT I-STAT 3, ART BLOOD GAS (G3+)
ACID-BASE EXCESS: 18 mmol/L — AB (ref 0.0–2.0)
Acid-Base Excess: 22 mmol/L — ABNORMAL HIGH (ref 0.0–2.0)
Acid-Base Excess: 18 mmol/L — ABNORMAL HIGH (ref 0.0–2.0)
BICARBONATE: 43.4 mmol/L — AB (ref 20.0–28.0)
Bicarbonate: 47.8 mmol/L — ABNORMAL HIGH (ref 20.0–28.0)
Bicarbonate: 45.1 mmol/L — ABNORMAL HIGH (ref 20.0–28.0)
O2 Saturation: 99 %
O2 Saturation: 97 %
O2 Saturation: 96 %
PCO2 ART: 68.6 mmHg — AB (ref 32.0–48.0)
PH ART: 7.457 — AB (ref 7.350–7.450)
PO2 ART: 147 mmHg — AB (ref 83.0–108.0)
PO2 ART: 105 mmHg (ref 83.0–108.0)
PO2 ART: 78 mmHg — AB (ref 83.0–108.0)
Patient temperature: 101.6
TCO2: 50 mmol/L — ABNORMAL HIGH (ref 22–32)
TCO2: 47 mmol/L — AB (ref 22–32)
TCO2: 45 mmol/L — ABNORMAL HIGH (ref 22–32)
pCO2 arterial: 78.1 mmHg (ref 32.0–48.0)
pCO2 arterial: 59.6 mmHg — ABNORMAL HIGH (ref 32.0–48.0)
pH, Arterial: 7.377 (ref 7.350–7.450)
pH, Arterial: 7.471 — ABNORMAL HIGH (ref 7.350–7.450)

## 2017-10-19 LAB — GLUCOSE, CAPILLARY
GLUCOSE-CAPILLARY: 121 mg/dL — AB (ref 70–99)
GLUCOSE-CAPILLARY: 109 mg/dL — AB (ref 70–99)
Glucose-Capillary: 101 mg/dL — ABNORMAL HIGH (ref 70–99)
Glucose-Capillary: 102 mg/dL — ABNORMAL HIGH (ref 70–99)
Glucose-Capillary: 104 mg/dL — ABNORMAL HIGH (ref 70–99)
Glucose-Capillary: 114 mg/dL — ABNORMAL HIGH (ref 70–99)

## 2017-10-19 LAB — CBC WITH DIFFERENTIAL/PLATELET
Abs Immature Granulocytes: 0.3 10*3/uL — ABNORMAL HIGH (ref 0.0–0.1)
BASOS ABS: 0 10*3/uL (ref 0.0–0.1)
BASOS PCT: 0 %
EOS ABS: 0.4 10*3/uL (ref 0.0–0.7)
Eosinophils Relative: 3 %
HCT: 26.4 % — ABNORMAL LOW (ref 36.0–46.0)
Hemoglobin: 8.1 g/dL — ABNORMAL LOW (ref 12.0–15.0)
IMMATURE GRANULOCYTES: 3 %
Lymphocytes Relative: 6 %
Lymphs Abs: 0.7 10*3/uL (ref 0.7–4.0)
MCH: 27.2 pg (ref 26.0–34.0)
MCHC: 30.7 g/dL (ref 30.0–36.0)
MCV: 88.6 fL (ref 78.0–100.0)
MONOS PCT: 1 %
Monocytes Absolute: 0.2 10*3/uL (ref 0.1–1.0)
NEUTROS PCT: 87 %
Neutro Abs: 10 10*3/uL — ABNORMAL HIGH (ref 1.7–7.7)
PLATELETS: 344 10*3/uL (ref 150–400)
RBC: 2.98 MIL/uL — ABNORMAL LOW (ref 3.87–5.11)
RDW: 14.1 % (ref 11.5–15.5)
WBC: 11.5 10*3/uL — ABNORMAL HIGH (ref 4.0–10.5)

## 2017-10-19 LAB — MAGNESIUM: MAGNESIUM: 1.7 mg/dL (ref 1.7–2.4)

## 2017-10-19 LAB — PROCALCITONIN: Procalcitonin: 0.44 ng/mL

## 2017-10-19 LAB — VANCOMYCIN, TROUGH: Vancomycin Tr: 15 ug/mL (ref 15–20)

## 2017-10-19 LAB — SJOGRENS SYNDROME-A EXTRACTABLE NUCLEAR ANTIBODY: SSA (Ro) (ENA) Antibody, IgG: <0.2 AI (ref 0.0–0.9)

## 2017-10-19 LAB — TRIGLYCERIDES: Triglycerides: 72 mg/dL (ref <150)

## 2017-10-19 LAB — SJOGRENS SYNDROME-B EXTRACTABLE NUCLEAR ANTIBODY: SSB (La) (ENA) Antibody, IgG: <0.2 AI (ref 0.0–0.9)

## 2017-10-19 LAB — LEGIONELLA, DFA (W/OUT CULTURE): LEGIONELLA PNEUMOPHILA DFA: NEGATIVE

## 2017-10-19 LAB — PHOSPHORUS: PHOSPHORUS: 3.8 mg/dL (ref 2.5–4.6)

## 2017-10-19 LAB — GLOMERULAR BASEMENT MEMBRANE ANTIBODIES: GBM AB: 3 U (ref 0–20)

## 2017-10-19 MED ORDER — PHENYLEPHRINE HCL-NACL 10-0.9 MG/250ML-% IV SOLN
0.0000 ug/min | INTRAVENOUS | Status: DC
  Administered 2017-10-19: 50 ug/min via INTRAVENOUS
  Filled 2017-10-19: qty 250

## 2017-10-19 MED ORDER — ORAL CARE MOUTH RINSE
15.0000 mL | OROMUCOSAL | Status: DC
Start: 2017-10-19 — End: 2017-10-19

## 2017-10-19 MED ORDER — MIDAZOLAM HCL 2 MG/2ML IJ SOLN: 2.0000 mg | Freq: Once | INTRAMUSCULAR | Status: DC | PRN

## 2017-10-19 MED ORDER — SODIUM CHLORIDE 0.9 % IV SOLN
2.0000 mg/h | INTRAVENOUS | Status: DC
  Administered 2017-10-19: 6 mg/h via INTRAVENOUS
  Administered 2017-10-19: 10 mg/h via INTRAVENOUS
  Administered 2017-10-20: 6 mg/h via INTRAVENOUS
  Administered 2017-10-20 – 2017-10-21 (×4): 7 mg/h via INTRAVENOUS
  Administered 2017-10-22: 6 mg/h via INTRAVENOUS
  Filled 2017-10-19 (×9): qty 10

## 2017-10-19 MED ORDER — FENTANYL 2500MCG IN NS 250ML (10MCG/ML) PREMIX INFUSION
100.0000 ug/h | INTRAVENOUS | Status: DC
  Administered 2017-10-19: 400 ug/h via INTRAVENOUS
  Administered 2017-10-19: 350 ug/h via INTRAVENOUS
  Administered 2017-10-20 (×2): 250 ug/h via INTRAVENOUS
  Administered 2017-10-20 – 2017-10-21 (×2): 300 ug/h via INTRAVENOUS
  Filled 2017-10-19 (×6): qty 250

## 2017-10-19 MED ORDER — FENTANYL CITRATE (PF) 100 MCG/2ML IJ SOLN: 100.0000 ug | Freq: Once | INTRAMUSCULAR | Status: DC | PRN

## 2017-10-19 MED ORDER — ARTIFICIAL TEARS OPHTHALMIC OINT
1.0000 "application " | TOPICAL_OINTMENT | Freq: Three times a day (TID) | OPHTHALMIC | Status: DC
  Administered 2017-10-19 – 2017-10-24 (×11): 1 via OPHTHALMIC
  Filled 2017-10-19 (×2): qty 3.5

## 2017-10-19 MED ORDER — PROPOFOL 1000 MG/100ML IV EMUL
25.0000 ug/kg/min | INTRAVENOUS | Status: DC
  Administered 2017-10-19: 10 ug/kg/min via INTRAVENOUS
  Administered 2017-10-19: 20 ug/kg/min via INTRAVENOUS
  Filled 2017-10-19 (×2): qty 100

## 2017-10-19 MED ORDER — POTASSIUM CHLORIDE 20 MEQ/15ML (10%) PO SOLN
40.0000 meq | Freq: Three times a day (TID) | ORAL | Status: DC
  Administered 2017-10-19 – 2017-10-21 (×9): 40 meq
  Filled 2017-10-19 (×10): qty 30

## 2017-10-19 MED ORDER — MAGNESIUM SULFATE 2 GM/50ML IV SOLN
2.0000 g | Freq: Once | INTRAVENOUS | Status: AC
  Administered 2017-10-19: 2 g via INTRAVENOUS
  Filled 2017-10-19: qty 50

## 2017-10-19 MED ORDER — FENTANYL CITRATE (PF) 100 MCG/2ML IJ SOLN: 100.0000 ug | Freq: Once | INTRAMUSCULAR | Status: DC

## 2017-10-19 MED ORDER — FUROSEMIDE 10 MG/ML IJ SOLN
80.0000 mg | Freq: Three times a day (TID) | INTRAMUSCULAR | Status: DC
  Administered 2017-10-19 (×3): 80 mg via INTRAVENOUS
  Filled 2017-10-19 (×3): qty 8

## 2017-10-19 MED ORDER — FENTANYL BOLUS VIA INFUSION
50.0000 ug | INTRAVENOUS | Status: DC | PRN
  Administered 2017-10-20 – 2017-10-21 (×5): 50 ug via INTRAVENOUS
  Filled 2017-10-19: qty 50

## 2017-10-19 MED ORDER — MIDAZOLAM HCL 2 MG/2ML IJ SOLN: 2.0000 mg | Freq: Once | INTRAMUSCULAR | Status: DC

## 2017-10-19 MED ORDER — CEFEPIME HCL 2 G IJ SOLR
2.0000 g | Freq: Three times a day (TID) | INTRAMUSCULAR | Status: DC
  Administered 2017-10-19 – 2017-10-24 (×15): 2 g via INTRAVENOUS
  Filled 2017-10-19 (×16): qty 2

## 2017-10-19 MED ORDER — CHLORHEXIDINE GLUCONATE 0.12% ORAL RINSE (MEDLINE KIT): 15.0000 mL | Freq: Two times a day (BID) | OROMUCOSAL | Status: DC

## 2017-10-19 MED ORDER — SODIUM CHLORIDE 0.9 % IV SOLN
3.0000 ug/kg/min | INTRAVENOUS | Status: DC
  Administered 2017-10-19: 5.5 ug/kg/min via INTRAVENOUS
  Administered 2017-10-19: 3 ug/kg/min via INTRAVENOUS
  Administered 2017-10-20: 4.5 ug/kg/min via INTRAVENOUS
  Administered 2017-10-20: 7 ug/kg/min via INTRAVENOUS
  Administered 2017-10-20: 2 ug/kg/min via INTRAVENOUS
  Administered 2017-10-21: 4.5 ug/kg/min via INTRAVENOUS
  Filled 2017-10-19 (×6): qty 20

## 2017-10-19 MED ORDER — MIDAZOLAM BOLUS VIA INFUSION
2.0000 mg | INTRAVENOUS | Status: DC | PRN
  Administered 2017-10-20 – 2017-10-21 (×6): 2 mg via INTRAVENOUS
  Filled 2017-10-19: qty 2

## 2017-10-19 NOTE — Procedures (Signed)
Arterial Catheter Insertion Procedure Note Johnanna SchneidersJuliana Morales [id number] 1990/07/05  Procedure: Insertion of Arterial Catheter  Indications: Blood pressure monitoring and Frequent blood sampling  Procedure Details Consent: Unable to obtain consent because of on ventilator.. Time Out: Verified patient identification, verified procedure, site/side was marked, verified correct patient position, special equipment/implants available, medications/allergies/relevent history reviewed, required imaging and test results available.  Performed  Maximum sterile technique was used including antiseptics, cap, gloves, gown, hand hygiene, mask and sheet. Skin prep: Chlorhexidine; local anesthetic administered 20 gauge catheter was inserted into left radial artery using the Seldinger technique. ULTRASOUND GUIDANCE USED: YES Evaluation Blood flow good; BP tracing good. Complications: No apparent complications.   Durwin GlazeBrown, Aneliz Carbary N 10/19/2017

## 2017-10-19 NOTE — Progress Notes (Signed)
Spoke with Dr Marchelle Gearingamaswamy and made him aware that PICC was placed with ECG technology and the policy is to leave it in place.

## 2017-10-19 NOTE — Progress Notes (Addendum)
Pharmacy Antibiotic Note  Stephanie SchneidersJuliana Young is a 27 y.o. female admitted on 10/12/2017 with multifocal pneumonia/ARDS and possible VAP.  Pharmacy has been consulted for Vancomycin and Cefepime dosing.  CXR shows worsening status, increased fever, and worsening respiratory status despite Vanc/Unasyn. Requested to change Unasyn to Cefepime for broader coverage.   Vancomycin trough was therapeutic at 15 today on Vancomycin 1500 mg q8h. Recently increased dose on 8/12 after subtherapeutic trough. WBC 11.5 trending down, febrile, hypertensive not requiring pressors and tachycardic. Scr 0.43, CrCl 111.2 ml/min, negative 0.7 L over last 24 hours with good urine output. ID is following as well.  Plan: - Continue Vancomycin 1500mg  IV Q8H (goal trough 15-20 mcg/mL) - Discontinue Unasyn - Initiate Cefepime 2g Q8H - Follow renal function, labs, cultures, and vancomycin troughs as needed.  Height: 5\' 5"  (165.1 cm) Weight: 175 lb 11.3 oz (79.7 kg) IBW/kg (Calculated) : 57  Temp (24hrs), Avg:100.4 F (38 C), Min:98.6 F (37 C), Max:102.2 F (39 C)  Recent Labs  Lab 10/12/17 2121 10/13/17 0059 10/14/17 0433 10/15/17 0414 10/17/17 0347 10/17/17 1244 10/18/17 0455 10/19/17 0011 10/19/17 0504 10/19/17 0644 10/19/17 0903  WBC  --   --  13.4* 16.6* 14.6*  --  11.5*  --  11.5*  --   --   CREATININE  --   --  0.68  --  0.68  --  0.53 0.61  --   --  0.43*  LATICACIDVEN 0.84 0.8  --   --   --   --  0.9  --   --   --   --   VANCOTROUGH  --   --   --   --   --  9*  --   --   --  15  --     Estimated Creatinine Clearance: 111.2 mL/min (A) (by C-G formula based on SCr of 0.43 mg/dL (L)).    No Known Allergies  Antimicrobials this admission: Azithromycin  8/8>>8/11 Rocephin 8/8>>8/9 Unasyn 8/9 >> 8/14 Vancomycin 8/10 >>  Cefepime 8/14 >>   Dose adjustments this admission:  - Vancomycin 1g Q8H 8/10>>8/12 - 8/12 trough was 9. Gave additional 500mg  and increased dose to 1500mg  Q8h thereafter. - 8/14  trough was 15 on 1500mg  Q8H and continued.   Microbiology results: 8/11 AFB negative, fungus stain negative, no bacteria on GS, Cx pending 8/11 BCx >> NGTD 8/10 MRSA PCR >> negative 8/8 RSV neg 8/7 BCx >> NGTD  Thank you for allowing pharmacy to be a part of this patient's care.  Tama Headingshristine Ko, PharmD Candidate 10/19/2017    11:53 AM   I discussed / reviewed the pharmacy note by Tama Headingshristine Ko, PharmD Candidate and I agree with the student's findings and plans as documented.  Link SnufferJessica Ren Aspinall, PharmD, BCPS, BCCCP Clinical Pharmacist Clinical phone 10/19/2017 until 3:30PM [phone]- [phone] After hours, please call [phone] 10/19/2017, 1:13 PM

## 2017-10-19 NOTE — Progress Notes (Signed)
PULMONARY / CRITICAL CARE MEDICINE   Name: Stephanie Young MRN: [id number] DOB: 18-Oct-1990    ADMISSION DATE:  10/12/2017 CONSULTATION DATE:  10/16/17  REFERRING MD: Dr. Wynelle Cleveland  CHIEF COMPLAINT:  Worsening dyspnea  Brief : Stephanie Young is a 27 y.o. female without significant past medical history, who presents with a cough, shortness of breath and chest pain.  Patient states that she has been having cough, shortness of breath and chest pain for almost 4 days. She coughs up clear mucus. Her chest pain is located in the frontal chest, sharp, mild, nonradiating, pleuritic, aggravated by deep breath and coughing. Patient has a subjective fever and chills. She states she has nausea and vomited several times, which has resolved. Currently patient does not have nausea, vomiting, diarrhea or abdominal pain. No symptoms of UTI. No unilateral weakness.  ED Course: pt was found to have  WBC 16.8, lactic acid 0.84, negative pregnancy test, potassium 3.3, creatinine normal, temperature 99.7, tachycardia, tachypnea, O2 sat are 96% on room air. CT angiogram is negative for PE, but showed multifocal pneumonia. Patient is placed on telemetry bed for observation.  8/11  I was asked to see the patient today for worsening respiratory distress.  She appears quite tired and has increased work of breathing. Has a temp today of 100.7 The patient has a Pa02 of 57 on a NRM. Her recent CT scan chest shows multifocal infitlrates Her urine for Strep and Legionella was negative. Her MRSA nasal spec was negative for MRSA. Her CXR appears much worse today than iot did on 8/7. WBC is 16K and Procal 0.56. The patient has no hitory of recreational drug use, recent travel. Had a negative HIV test.The patient is on no immunosuppressive therapy.  She does not smoke. Ha s most recently been on Azithromax, Unasyn and Vanco. The patient has been seen by ID. Gyn bateria - negative RVP negative    10/17/2017 - 435mg fent +  80 diprivan + 0.567mversed. -> arousable but then desatus easy. Follows commands. On vent. S/p bronch yesterday -> 49% polys, 26% lymphs, 40% fio2, peep 10, -Significant vent dysnchroncy yesterday is improved.   [place]   10/18/17 - fentanyl, diprivan and versed gtt /. On Unasynm and Vanc. Fever curve trending down. CXR and pulse ox better after lasx x 1 yesterday . +17.6L since admit   SUBJECTIVE/OVERNIGHT/INTERVAL HX 8/14 - diuresed and now +16+ L but cxr worse, hyppoxemia worse with fio2 at 100%. On versed 1041mnd fent 400m44m RASS -3. - opens eyes when stimulated per RN. BIs 95 currently   VITAL SIGNS: BP 96/75   Pulse (!) 110   Temp 99.5 F (37.5 C) (Oral)   Resp (!) 26   Ht _0  (1.651 m)   Wt 79.7 kg   SpO2 93%   BMI 29.24 kg/m          General Appearance:    Looks criticall illl  Head:    Normocephalic, without obvious abnormality, atraumatic  Eyes:    PERRL - yes, conjunctiva/corneas - clear      Ears:    Normal external ear canals, both ears  Nose:   NG tube - no  Throat:  ETT TUBE - yes , OG tube - yes on TF  Neck:   Supple,  No enlargement/tenderness/nodules     Lungs:  Clear to auscultation bilaterally, Ventilator   Synchrony - ye scurrently o 100 %fio3 and peep 5  Chest wall:    No deformity  Heart:    S1 and S2 normal, no murmur, CVP - no.  Pressors - no  Abdomen:     Soft, no masses, no organomegaly  Genitalia:    Not done  Rectal:   not done  Extremities:   Extremities- intct     Skin:   Intact in exposed areas .      Neurologic:   Sedation - fent/vrsed -> RASS - -3 .       Vent Mode: PRVC FiO2 (%):  [40 %-100 %] 100 % Set Rate:  [18 bmp] 18 bmp Vt Set:  [440 mL] 440 mL PEEP:  [5 cmH20-8 cmH20] 5 cmH20 Plateau Pressure:  [11 cmH20-25 cmH20] 25 cmH20INTAKE / OUTPUT: I/O last 3 completed shifts: In: 7664.8 [I.V.:2315.1;  Other:220; NG/GT:2480; IV Piggyback:2649.7] Out: 7000 [Urine:6315; Emesis/NG output:685]         PULMONARY Recent Labs  Lab 10/16/17 1150 10/16/17 1458 10/17/17 0332 10/17/17 0348  PHART 7.427 7.382 7.698* 7.377  PCO2ART 40.7 45.5 16.4* 43.7  PO2ART 56.7* 248* <15* 74.0*  HCO3 26.4 26.5 26.0 25.4  TCO2  --   --  27 27  O2SAT 89.7 99.4 93.0 93.0    CBC Recent Labs  Lab 10/17/17 0347 10/18/17 0455 10/19/17 0504  HGB 9.5* 8.7* 8.1*  HCT 30.2* 28.0* 26.4*  WBC 14.6* 11.5* 11.5*  PLT 350 382 344    COAGULATION No results for input(s): INR in the last 168 hours.  CARDIAC  No results for input(s): TROPONINI in the last 168 hours. No results for input(s): PROBNP in the last 168 hours.   CHEMISTRY Recent Labs  Lab 10/12/17 1643 10/14/17 0433 10/17/17 0347 10/18/17 0455 10/19/17 0011 10/19/17 0504  NA 135 139 140 141 142  --   K 3.3* 3.5 3.0* 3.1* 3.5  --   CL 101 105 103 100 93*  --   CO2 21* 21* 28 31 39*  --   GLUCOSE 109* 111* 95 104* 125*  --   BUN 6 <5* _0 --   CREATININE 0.60 0.68 0.68 0.53 0.61  --   CALCIUM 9.3 8.7* 8.3* 7.7* 8.0*  --   MG  --   --  1.9 2.0  --  1.7  PHOS  --   --  3.5 3.6  --  3.8   Estimated Creatinine Clearance: 111.2 mL/min (by C-G formula based on SCr of 0.61 mg/dL).   LIVER Recent Labs  Lab 10/12/17 1911 10/18/17 0455  AST 22 20  ALT 17 13  ALKPHOS 66 47  BILITOT 1.4* 0.5  PROT 8.0 5.4*  ALBUMIN 3.7 1.6*     INFECTIOUS Recent Labs  Lab 10/12/17 2121 10/13/17 0059 10/18/17 0455  LATICACIDVEN 0.84 0.8 0.9  PROCALCITON  --  0.56  --      ENDOCRINE CBG (last 3)  Recent Labs    10/18/17 2359 10/19/17 0425 10/19/17 0743  GLUCAP 104* 121* 101*         IMAGING x48h  - image(s) personally visualized  -   highlighted in bold Dg Chest Port 1 View  Result Date: 10/19/2017 CLINICAL DATA:  Endotracheal tube present. EXAM: PORTABLE CHEST 1 VIEW COMPARISON:  Radiograph of October 18, 2017.  FINDINGS: Stable cardiomediastinal silhouette. Endotracheal tube is less than 1 cm above the carina. Nasogastric tube is seen coiled  within proximal stomach. No pneumothorax is noted. Stable bilateral lung airspace opacities are noted concerning for pneumonia or possibly edema. Right-sided PICC line is noted with tip in right atrium. Bony thorax is unremarkable. IMPRESSION: Stable bilateral diffuse airspace opacities are noted concerning for pneumonia or possibly edema. Endotracheal tube is less than 1 cm above the carina; withdrawal by 2-3 cm is recommended. Right-sided PICC line is noted with tip in right atrium; withdrawal by 3-4 cm is recommended. These results will be called to the ordering clinician or representative by the Radiologist Assistant, and communication documented in the PACS or zVision Dashboard. Electronically Signed   By: Marijo Conception, M.D.   On: 10/19/2017 07:19   Dg Chest Port 1 View  Result Date: 10/18/2017 CLINICAL DATA:  Intubation. EXAM: PORTABLE CHEST 1 VIEW COMPARISON:  10/17/2017. FINDINGS: Right PICC line noted with tip at the cavoatrial junction/upper right atrium. Endotracheal tube and NG tube in stable position. Heart size stable. Diffuse bilateral airspace disease again noted. Similar findings on prior exam. No pleural effusion or pneumothorax. IMPRESSION: 1. Right PICC line noted with tip at the cavoatrial junction/upper right atrium. 2.  Endotracheal tube and NG tube in stable position. 3. Bilateral diffuse airspace disease again noted. Similar findings noted on prior exam. Electronically Signed   By: Marcello Moores  Register   On: 10/18/2017 06:18   [place] Ekg Site Rite  Result Date: 10/17/2017 If Site Rite image not attached, placement could not be confirmed due to current cardiac rhythm.   Principal Problem:   Multifocal pneumonia Active Problems:   Hypokalemia   Sepsis ([place])   Lobar pneumonia ([place])   Acute respiratory failure with hypoxia (HCC)   ARDS (adult  respiratory distress syndrome) (HCC)     ASSESSMENT / PLAN:  PULMONARY/INFX   A: ARDS 10/16/17 with acute hypopxemic resp failure   - 10/19/2017 -  Worse cxr despite diuresis and worse fiop2 need to 100% , Peep still at 5  Plan Await rest of micro  - see ID Incrase diuresis Start ARDS protocol formally Start nimbex x 48h   CARDIOVASCULAR  A - 10/19/2017 - nil acute s/p PICC 10/17/17 . ECHO ef 55% with some septal hypokinesis  P Diurese Depending on course cards consult - for now no indication  RENAL  A - normal renal function but clinically volume overloaded 10/19/2017. +16L and mag < 2gm% an dK < 4  P - increase diuresis  - replete K - replete mag  HEME Recent Labs  Lab 10/17/17 0347 10/18/17 0455 10/19/17 0504  HGB 9.5* 8.7* 8.1*  HCT 30.2* 28.0* 26.4*  WBC 14.6* 11.5* 11.5*  PLT 350 382 344    A anemia of critical illness  P lovenox for dvt proph + - PRBC for hgb </= 6.9gm%    - exceptions are   -  if ACS susepcted/confirmed then transfuse for hgb </= 8.0gm%,  or    -  active bleeding with hemodynamic instability, then transfuse regardless of hemoglobin value   At at all times try to transfuse 1 unit prbc as possible with exception of active hemorrhage    GI  A: On TF  P PPI TF  ID Recent Labs  Lab 10/14/17 0433 10/15/17 0414 10/17/17 0347 10/18/17 0455 10/19/17 0504  WBC 13.4* 16.6* 14.6* 11.5* 11.5*   Recent Labs  Lab 10/13/17 0059  PROCALCITON 0.56     A Presented with infectious prodrome.  BAL 8/11 - 49% PMN, 26% lymphs, 25% Macro. Autoimmune  and vasculitis negative 10/17/17  - - 10/19/17 - no microorg so far. Spiking fever and cxr worse but wbc  p - await BAL cultures; ID following - recheck PCT given worsening cxr but no rise in wbc   ENDO A No hx of DM  P ICU hyperglycemia protocol  CNS  A: in need of deep sedation due to aRDS.    - 8/14 - RASS -3, BIS 95. CXR worse /ARDs worse   P -RASS goal -4 or deeper  with BIS goal ddeper than 60 - fent gtt - versed gtt - add back diprivan gtt - start nimbex to help ARDS      FAMILY  Mom not at bedside 10/19/2017. Boyfriend not at bedside 10/19/2017      The patient is critically ill with multiple organ systems failure and requires high complexity decision making for assessment and support, frequent evaluation and titration of therapies, application of advanced monitoring technologies and extensive interpretation of multiple databases.   Critical Care Time devoted to patient care services described in this note is  30  Minutes. This time reflects time of care of this signee Dr Brand Males. This critical care time does not reflect procedure time, or teaching time or supervisory time of PA/NP/Med student/Med Resident etc but could involve care discussion time    Dr. Brand Males, M.D., [facility].C.P Pulmonary and Critical Care Medicine Staff Physician [facility] Pager: [phone], If no answer or between  15:00h - 7:00h: call [phone]  10/19/2017 9:19 AM

## 2017-10-19 NOTE — Progress Notes (Signed)
When using ECG technology as confirmation of tip placement, if there is a discrepancy of the tip placement as noted by subsequent radiographic interpretation, a member of the Vascular Access Team should first of all, assess the PICC to determine if the exposed catheter is the same length as was initially noted with the insertion. If this measurement is the same, then the ECG technology is the more accurate method of confirming PICC tip location as compared to radiographic interpretation.

## 2017-10-19 NOTE — Progress Notes (Signed)
[facility]  Date of Admission:  10/12/2017   Total days of antibiotics 8        Day 4 Vancomycin         Day 6 amp-sulbactam          Patient ID: Stephanie Young is a 27 y.o. female with  Principal Problem:   Multifocal pneumonia Active Problems:   Hypokalemia   Sepsis ([place])   Lobar pneumonia ([place])   Acute respiratory failure with hypoxia ([place])   ARDS (adult respiratory distress syndrome) ([place])   . albuterol  2.5 mg Nebulization Q4H  . artificial tears  1 application Both Eyes Q6S  . chlorhexidine gluconate (MEDLINE KIT)  15 mL Mouth Rinse BID  . Chlorhexidine Gluconate Cloth  6 each Topical Daily  . enoxaparin (LOVENOX) injection  40 mg Subcutaneous Daily  . feeding supplement (PRO-STAT SUGAR FREE 64)  30 mL Per Tube TID  . free water  100 mL Per Tube Q4H  . furosemide  80 mg Intravenous TID  . insulin aspart  2-6 Units Subcutaneous Q4H  . mouth rinse  15 mL Mouth Rinse 10 times per day  . multivitamin  15 mL Per Tube Daily  . pantoprazole sodium  40 mg Per Tube Daily  . potassium chloride  40 mEq Per Tube TID  . sodium chloride flush  10-40 mL Intracatheter Q12H    SUBJECTIVE: Intubated   No Known Allergies  OBJECTIVE: Vitals:   10/19/17 0803 10/19/17 0815 10/19/17 0830 10/19/17 0845  BP:   118/76 115/79  Pulse:      Resp:  (!) 23 (!) 26 (!) 26  Temp:      TempSrc:      SpO2: 93% 94% 100% 100%  Weight:      Height:       Body mass index is 29.24 kg/m.  Physical Exam  Constitutional: She appears well-developed and well-nourished.  Sedated in bed  Eyes:  Opens eyes spontaneously.   Neck: No JVD present.  Cardiovascular: Regular rhythm, S1 normal, S2 normal, normal heart sounds and normal pulses. Tachycardia present.  No murmur heard. Pulmonary/Chest:  Tachypneic. Decreased breath sounds over bilateral bases. Frothy white secretions in ETT. 100% FiO2. PEEP 5.   Abdominal: Soft. Bowel sounds are normal. She exhibits  no distension.  Musculoskeletal: Edema: generalized   Skin: Skin is warm and dry. Capillary refill takes less than 2 seconds. No rash noted. No pallor.  Psychiatric:  Agitated. Currently getting paralytics/high dose sedation.   Vitals reviewed.   Lab Results Lab Results  Component Value Date   WBC 11.5 (H) 10/19/2017   HGB 8.1 (L) 10/19/2017   HCT 26.4 (L) 10/19/2017   MCV 88.6 10/19/2017   PLT 344 10/19/2017    Lab Results  Component Value Date   CREATININE 0.61 10/19/2017   BUN 9 10/19/2017   NA 142 10/19/2017   K 3.5 10/19/2017   CL 93 (L) 10/19/2017   CO2 39 (H) 10/19/2017    Lab Results  Component Value Date   ALT 13 10/18/2017   AST 20 10/18/2017   ALKPHOS 47 10/18/2017   BILITOT 0.5 10/18/2017     Microbiology: BCx 8/07 >> no growth  BCx 8/11 >> no growth  BAL 8/11 AFB negative, fungus stain negative, no bacteria on GS, cx pending  RVP 8/09 >> negative   Assessment & Plan:  1. Multifocal Pneumonia = Still with ongoing fevers. Stable  WBC count. Procalcitonin decreasing. Her nasal MRSA PCR is negative which makes MRSA pneumonia less likely. Will continue ampicillin-sulbactam + Vancomycin for now and follow. No new micro data now.   2. ARDS = she continues to require max support from ventilator. Per CCM direction. She is >15L+ since admission.   3. Airborne Precautions = AFB smear from bronch sample negative. D/C Airborne precautions.   Janene Madeira, MSN, NP-C [facility] [place] Cell: [phone] Pager: [phone]  10/19/2017  9:53 AM

## 2017-10-19 NOTE — Progress Notes (Signed)
ABG results obtained after ventilator changes.  Results called to Dr. Marchelle Gearingamaswamy.  Ventilator orders placed for patient to be on 6cc/kg and to adjust respiratory rate to maintain minute ventilation.  Per MD, PCO2 is fine to be high due to pH being normal.  Decreased tidal volume to patient's 6cc/kg and adjusted respiratory rate for minute ventilation.  Also decreased FIO2 to 70% and increased PEEP to 10.  Will obtain another ABG.     Ref. Range 10/19/2017 15:06  Sample type Unknown ARTERIAL  pH, Arterial Latest Ref Range: 7.350 - 7.450  7.377  pCO2 arterial Latest Ref Range: 32.0 - 48.0 mmHg 78.1 (HH)  pO2, Arterial Latest Ref Range: 83.0 - 108.0 mmHg 105.0  TCO2 Latest Ref Range: 22 - 32 mmol/L 47 (H)  Acid-Base Excess Latest Ref Range: 0.0 - 2.0 mmol/L 18.0 (H)  Bicarbonate Latest Ref Range: 20.0 - 28.0 mmol/L 45.1 (H)  O2 Saturation Latest Units: % 97.0  Patient temperature Unknown 101.6 F  Collection site Unknown ARTERIAL LINE

## 2017-10-19 NOTE — Progress Notes (Signed)
Dr Marchelle Gearingamaswamy informed of temp 101.6 and ABG results noted .

## 2017-10-19 NOTE — Progress Notes (Signed)
Pulled back ETT 2cm per MD order.  Currently at 22 at the lip.  After pulling ETT back, patient began to get into a coughing spell and began to desat to the 70s.  Suctioned patient and obtained a copious amount of thick, frothy secretions.  RN helped with patient sedation.  Increased FIO2 to 100% and when patient finally settled down sats had stabilized at 93%.  Will continue to monitor and wean FIO2 as tolerated.

## 2017-10-19 NOTE — Progress Notes (Signed)
Febrile now BP soft  A Posible VAP  Plan Check trach aspirate for culture Change unasyn to cefpeime Continue vanc Neo via piv for low bp - continue diuresis  Dr. Kalman ShanMurali Bilbo Carcamo, M.D., [facility].C.P Pulmonary and Critical Care Medicine Staff Physician, [facility] Director - Interstitial Lung Disease  Program  Pulmonary Fibrosis [facility] [place], [place], [id number]  Pager: [phone], If no answer or between  15:00h - 7:00h: call [phone] Telephone: [phone]

## 2017-10-19 NOTE — Progress Notes (Signed)
Follow up ABG results obtained.  Results obtained on ventilator settings of VT of 350 (6cc/kg), respiratory rate of 28, FIO2 of 60% and PEEP of 10.  No further changes at this time.  Will continue to monitor.    Ref. Range 10/19/2017 16:55  Sample type Unknown ARTERIAL  pH, Arterial Latest Ref Range: 7.350 - 7.450  7.471 (H)  pCO2 arterial Latest Ref Range: 32.0 - 48.0 mmHg 59.6 (H)  pO2, Arterial Latest Ref Range: 83.0 - 108.0 mmHg 78.0 (L)  TCO2 Latest Ref Range: 22 - 32 mmol/L 45 (H)  Acid-Base Excess Latest Ref Range: 0.0 - 2.0 mmol/L 18.0 (H)  Bicarbonate Latest Ref Range: 20.0 - 28.0 mmol/L 43.4 (H)  O2 Saturation Latest Units: % 96.0  Patient temperature Unknown 98.7 F  Collection site Unknown ARTERIAL LINE

## 2017-10-20 ENCOUNTER — Inpatient Hospital Stay (HOSPITAL_COMMUNITY): Payer: Self-pay

## 2017-10-20 LAB — POCT I-STAT 3, ART BLOOD GAS (G3+)
ACID-BASE EXCESS: 20 mmol/L — AB (ref 0.0–2.0)
Acid-Base Excess: 19 mmol/L — ABNORMAL HIGH (ref 0.0–2.0)
BICARBONATE: 45.4 mmol/L — AB (ref 20.0–28.0)
Bicarbonate: 44.5 mmol/L — ABNORMAL HIGH (ref 20.0–28.0)
O2 Saturation: 99 %
O2 Saturation: 99 %
PCO2 ART: 53.2 mmHg — AB (ref 32.0–48.0)
PCO2 ART: 66.4 mmHg — AB (ref 32.0–48.0)
PH ART: 7.53 — AB (ref 7.350–7.450)
PO2 ART: 127 mmHg — AB (ref 83.0–108.0)
Patient temperature: 98
Patient temperature: 98
TCO2: 46 mmol/L — ABNORMAL HIGH (ref 22–32)
TCO2: 47 mmol/L — AB (ref 22–32)
pH, Arterial: 7.442 (ref 7.350–7.450)
pO2, Arterial: 120 mmHg — ABNORMAL HIGH (ref 83.0–108.0)

## 2017-10-20 LAB — CBC WITH DIFFERENTIAL/PLATELET
BASOS ABS: 0 10*3/uL (ref 0.0–0.1)
Basophils Relative: 0 %
Eosinophils Absolute: 0.3 10*3/uL (ref 0.0–0.7)
Eosinophils Relative: 2 %
HEMATOCRIT: 30.6 % — AB (ref 36.0–46.0)
Hemoglobin: 9.5 g/dL — ABNORMAL LOW (ref 12.0–15.0)
LYMPHS PCT: 5 %
Lymphs Abs: 0.8 10*3/uL (ref 0.7–4.0)
MCH: 27.5 pg (ref 26.0–34.0)
MCHC: 31 g/dL (ref 30.0–36.0)
MCV: 88.7 fL (ref 78.0–100.0)
MONOS PCT: 2 %
Monocytes Absolute: 0.3 10*3/uL (ref 0.1–1.0)
Neutro Abs: 14.5 10*3/uL — ABNORMAL HIGH (ref 1.7–7.7)
Neutrophils Relative %: 91 %
PLATELETS: 477 10*3/uL — AB (ref 150–400)
RBC: 3.45 MIL/uL — ABNORMAL LOW (ref 3.87–5.11)
RDW: 13.8 % (ref 11.5–15.5)
WBC: 15.9 10*3/uL — AB (ref 4.0–10.5)

## 2017-10-20 LAB — GLUCOSE, CAPILLARY
GLUCOSE-CAPILLARY: 110 mg/dL — AB (ref 70–99)
GLUCOSE-CAPILLARY: 102 mg/dL — AB (ref 70–99)
Glucose-Capillary: 101 mg/dL — ABNORMAL HIGH (ref 70–99)
Glucose-Capillary: 107 mg/dL — ABNORMAL HIGH (ref 70–99)
Glucose-Capillary: 124 mg/dL — ABNORMAL HIGH (ref 70–99)
Glucose-Capillary: 129 mg/dL — ABNORMAL HIGH (ref 70–99)
Glucose-Capillary: 87 mg/dL (ref 70–99)

## 2017-10-20 LAB — BASIC METABOLIC PANEL
ANION GAP: 12 (ref 5–15)
Anion gap: 7 (ref 5–15)
BUN: 18 mg/dL (ref 6–20)
BUN: 24 mg/dL — ABNORMAL HIGH (ref 6–20)
CALCIUM: 8 mg/dL — AB (ref 8.9–10.3)
CHLORIDE: 88 mmol/L — AB (ref 98–111)
CO2: 40 mmol/L — AB (ref 22–32)
CO2: 34 mmol/L — ABNORMAL HIGH (ref 22–32)
Calcium: 8.4 mg/dL — ABNORMAL LOW (ref 8.9–10.3)
Chloride: 99 mmol/L (ref 98–111)
Creatinine, Ser: 0.54 mg/dL (ref 0.44–1.00)
Creatinine, Ser: 0.56 mg/dL (ref 0.44–1.00)
GFR calc Af Amer: >60 mL/min (ref >60)
GFR calc non Af Amer: >60 mL/min (ref >60)
GLUCOSE: 113 mg/dL — AB (ref 70–99)
Glucose, Bld: 123 mg/dL — ABNORMAL HIGH (ref 70–99)
POTASSIUM: 3.7 mmol/L (ref 3.5–5.1)
Potassium: 3.8 mmol/L (ref 3.5–5.1)
SODIUM: 140 mmol/L (ref 135–145)
Sodium: 140 mmol/L (ref 135–145)

## 2017-10-20 LAB — PHOSPHORUS: Phosphorus: 5.1 mg/dL — ABNORMAL HIGH (ref 2.5–4.6)

## 2017-10-20 LAB — MAGNESIUM: Magnesium: 2.3 mg/dL (ref 1.7–2.4)

## 2017-10-20 LAB — PROCALCITONIN: Procalcitonin: 0.45 ng/mL

## 2017-10-20 MED ORDER — FUROSEMIDE 10 MG/ML IJ SOLN
40.0000 mg | Freq: Three times a day (TID) | INTRAMUSCULAR | Status: DC
Start: 2017-10-20 — End: 2017-10-22
  Administered 2017-10-20 – 2017-10-22 (×7): 40 mg via INTRAVENOUS
  Filled 2017-10-20 (×7): qty 4

## 2017-10-20 MED ORDER — ACETAZOLAMIDE SODIUM 500 MG IJ SOLR
500.0000 mg | Freq: Four times a day (QID) | INTRAMUSCULAR | Status: AC
  Administered 2017-10-20 (×4): 500 mg via INTRAVENOUS
  Filled 2017-10-20 (×4): qty 500

## 2017-10-20 MED ORDER — METOPROLOL TARTRATE 5 MG/5ML IV SOLN
2.5000 mg | INTRAVENOUS | Status: DC | PRN
  Administered 2017-10-20 (×3): 2.5 mg via INTRAVENOUS
  Administered 2017-10-21: 5 mg via INTRAVENOUS
  Filled 2017-10-20 (×3): qty 5

## 2017-10-20 MED ORDER — PRO-STAT SUGAR FREE PO LIQD
30.0000 mL | Freq: Two times a day (BID) | ORAL | Status: DC
  Administered 2017-10-20 – 2017-10-24 (×8): 30 mL
  Filled 2017-10-20 (×9): qty 30

## 2017-10-20 MED ORDER — VITAL AF 1.2 CAL PO LIQD
1000.0000 mL | ORAL | Status: DC
  Administered 2017-10-20 – 2017-10-23 (×3): 1000 mL

## 2017-10-20 NOTE — Progress Notes (Signed)
PULMONARY / CRITICAL CARE MEDICINE   Name: Lorel Lembo MRN: [id number] DOB: May 22, 1990    ADMISSION DATE:  10/12/2017 CONSULTATION DATE:  10/16/17  REFERRING MD: Dr. Wynelle Cleveland  CHIEF COMPLAINT:  Worsening dyspnea  Brief : Stephanie Young is a 27 y.o. female without significant past medical history, who presents with a cough, shortness of breath and chest pain.  Patient states that she has been having cough, shortness of breath and chest pain for almost 4 days. She coughs up clear mucus. Her chest pain is located in the frontal chest, sharp, mild, nonradiating, pleuritic, aggravated by deep breath and coughing. Patient has a subjective fever and chills. She states she has nausea and vomited several times, which has resolved. Currently patient does not have nausea, vomiting, diarrhea or abdominal pain. No symptoms of UTI. No unilateral weakness.  ED Course: pt was found to have  WBC 16.8, lactic acid 0.84, negative pregnancy test, potassium 3.3, creatinine normal, temperature 99.7, tachycardia, tachypnea, O2 sat are 96% on room air. CT angiogram is negative for PE, but showed multifocal pneumonia. Patient is placed on telemetry bed for observation.  8/11  I was asked to see the patient today for worsening respiratory distress.  She appears quite tired and has increased work of breathing. Has a temp today of 100.7 The patient has a Pa02 of 57 on a NRM. Her recent CT scan chest shows multifocal infitlrates Her urine for Strep and Legionella was negative. Her MRSA nasal spec was negative for MRSA. Her CXR appears much worse today than iot did on 8/7. WBC is 16K and Procal 0.56. The patient has no hitory of recreational drug use, recent travel. Had a negative HIV test.The patient is on no immunosuppressive therapy.  She does not smoke. Ha s most recently been on Azithromax, Unasyn and Vanco. The patient has been seen by ID. Gyn bateria - negative RVP negative    10/17/2017 - 469mg fent +  80 diprivan + 0.528mversed. -> arousable but then desatus easy. Follows commands. On vent. S/p bronch yesterday -> 49% polys, 26% lymphs, 40% fio2, peep 10, -Significant vent dysnchroncy yesterday is improved.   [place]   10/18/17 - fentanyl, diprivan and versed gtt /. On Unasynm and Vanc. Fever curve trending down. CXR and pulse ox better after lasx x 1 yesterday . +17.6L since admit   8/14 - diuresed and now +16+ L but cxr worse, hyppoxemia worse with fio2 at 100%. On versed 1012mnd fent 400m62m RASS -3. - opens eyes when stimulated per RN. BIs 95 currently. Abx changed - repeat culture trach, increase lasix, start nimbex   SUBJECTIVE/OVERNIGHT/INTERVAL HX  8/15 - fever improved vnadir before respike. Down to +14L with high dose lasix. Now on diamox for contraction alk. CXR vastly better. Not on pressors. On fent gtt, versed, and diprivan -> BIS 35. On nimbex. -. TOF 0/4  On fio2 50%/peep 10 -> pulse ox 95%    VITAL SIGNS: BP 139/68   Pulse 100   Temp 98.2 F (36.8 C) (Oral)   Resp 20   Ht _0  (1.651 m)   Wt 76.2 kg   SpO2 92%   BMI 27.96 kg/m         General Appearance:    Looks criticall ill +  Head:    Normocephalic, without obvious abnormality, atraumatic  Eyes:  PERRL - yes, conjunctiva/corneas - clear      Ears:    Normal external ear canals, both ears  Nose:   NG tube - no  Throat:  ETT TUBE - yes , OG tube - yes  Neck:   Supple,  No enlargement/tenderness/nodules     Lungs:     Clear to auscultation bilaterally - improved crackles, Ventilat Sync - YES  Chest wall:    No deformity  Heart:    S1 and S2 normal, no murmur, CVP - no.  Pressors - no  Abdomen:     Soft, no masses, no organomegaly  Genitalia:    Not done  Rectal:   not done  Extremities:   Extremities- intact     Skin:   Intact in exposed areas .      Neurologic:   Sedation -  fent , versed, dipriva -> RASS - -5/BIS 30s and TOF 0/4          Vent Mode: PRVC FiO2 (%):  [50 %-80 %] 50 % Set Rate:  [18 bmp-28 bmp] 20 bmp Vt Set:  [350 mL-420 mL] 350 mL PEEP:  [5 cmH20-10 cmH20] 10 cmH20 Plateau Pressure:  [18 cmH20-27 cmH20] 20 cmH20INTAKE / OUTPUT: I/O last 3 completed shifts: In: 7569.6 [I.V.:2407.7; Other:160; NG/GT:2390; IV Piggyback:2612] Out: 34193 [Urine:10530]         PULMONARY Recent Labs  Lab 10/19/17 1211 10/19/17 1506 10/19/17 1655 10/20/17 0324 10/20/17 0416  PHART 7.457* 7.377 7.471* 7.530* 7.442  PCO2ART 68.6* 78.1* 59.6* 53.2* 66.4*  PO2ART 147.0* 105.0 78.0* 127.0* 120.0*  HCO3 47.8* 45.1* 43.4* 44.5* 45.4*  TCO2 50* 47* 45* 46* 47*  O2SAT 99.0 97.0 96.0 99.0 99.0    CBC Recent Labs  Lab 10/18/17 0455 10/19/17 0504 10/20/17 0437  HGB 8.7* 8.1* 9.5*  HCT 28.0* 26.4* 30.6*  WBC 11.5* 11.5* 15.9*  PLT 382 344 477*    COAGULATION No results for input(s): INR in the last 168 hours.  CARDIAC  No results for input(s): TROPONINI in the last 168 hours. No results for input(s): PROBNP in the last 168 hours.   CHEMISTRY Recent Labs  Lab 10/17/17 0347 10/18/17 0455 10/19/17 0011 10/19/17 0504 10/19/17 0903 10/20/17 0000 10/20/17 0437  NA 140 141 142  --  142 140  --   K 3.0* 3.1* 3.5  --  3.5 3.8  --   CL 103 100 93*  --  96* 88*  --   CO2 28 31 39*  --  39* 40*  --   GLUCOSE 95 104* 125*  --  112* 123*  --   BUN _0 --  12 18  --   CREATININE 0.68 0.53 0.61  --  0.43* 0.56  --   CALCIUM 8.3* 7.7* 8.0*  --  7.8* 8.4*  --   MG 1.9 2.0  --  1.7  --   --  2.3  PHOS 3.5 3.6  --  3.8  --   --  5.1*   Estimated Creatinine Clearance: 108.8 mL/min (by C-G formula based on SCr of 0.56 mg/dL).   LIVER Recent Labs  Lab 10/18/17 0455  AST 20  ALT 13  ALKPHOS 47  BILITOT 0.5  PROT 5.4*  ALBUMIN 1.6*     INFECTIOUS Recent Labs  Lab 10/18/17 0455 10/19/17 0903 10/20/17 0437  LATICACIDVEN 0.9  --    --   PROCALCITON  --  0.44 0.45     ENDOCRINE CBG (  last 3)  Recent Labs    10/19/17 2346 10/20/17 0343 10/20/17 0719  GLUCAP 87 129* 124*         IMAGING x48h  - image(s) personally visualized  -   highlighted in bold Dg Chest Port 1 View  Result Date: 10/20/2017 CLINICAL DATA:  Shortness of breath.  ETT. EXAM: PORTABLE CHEST 1 VIEW COMPARISON:  October 19, 2017 FINDINGS: The ETT is in good position. The right PICC line has been pulled back and terminates in the central SVC. No pneumothorax. Diffuse bilateral pulmonary opacities persist but have improved. No other interval changes. The NG tube terminates in the stomach. IMPRESSION: 1. Support apparatus as above. 2. Persistent but improving diffuse bilateral pulmonary opacities. Electronically Signed   By: Dorise Bullion III M.D   On: 10/20/2017 07:04   Dg Chest Port 1 View  Result Date: 10/19/2017 CLINICAL DATA:  Endotracheal tube present. EXAM: PORTABLE CHEST 1 VIEW COMPARISON:  Radiograph of October 18, 2017. FINDINGS: Stable cardiomediastinal silhouette. Endotracheal tube is less than 1 cm above the carina. Nasogastric tube is seen coiled within proximal stomach. No pneumothorax is noted. Stable bilateral lung airspace opacities are noted concerning for pneumonia or possibly edema. Right-sided PICC line is noted with tip in right atrium. Bony thorax is unremarkable. IMPRESSION: Stable bilateral diffuse airspace opacities are noted concerning for pneumonia or possibly edema. Endotracheal tube is less than 1 cm above the carina; withdrawal by 2-3 cm is recommended. Right-sided PICC line is noted with tip in right atrium; withdrawal by 3-4 cm is recommended. These results will be called to the ordering clinician or representative by the Radiologist Assistant, and communication documented in the PACS or zVision Dashboard. Electronically Signed   By: Marijo Conception, M.D.   On: 10/19/2017 07:19    Principal Problem:   Multifocal  pneumonia Active Problems:   Hypokalemia   Sepsis ([place])   Lobar pneumonia ([place])   Acute respiratory failure with hypoxia (HCC)   ARDS (adult respiratory distress syndrome) (HCC)     ASSESSMENT / PLAN:  PULMONARY/INFX   A: ARDS 10/16/17 with acute hypopxemic resp failure   - 10/20/2017 -  Huge improvement in CXR and fhypoxemia following aggressive diureseis and s/p nimbex start and abx change 24h earlier. Now on 50% fio2/peep 10  Plan - ARDs ptotocol to continue - Nimbex to continue - but stop date 10/21/17 - 48h -will see if we can wean her off later 10/20/2017 - if worsens next step is to prone but she is better - Lasix to continue but reduce dose  CARDIOVASCULAR  A - 10/20/2017 - nil acute s/p PICC 10/17/17 . ECHO ef 55% with some septal hypokinesis  P Diurese but reduce dose given alak Depending on course cards consult - for now no indication  RENAL  A - normal renal function but clinically volume overloaded 10/20/2017.  - improved Volume overload - down to +14L . Has developed alkalosis and on diamox  P Reduce diuresis Continue diamox   HEME Recent Labs  Lab 10/18/17 0455 10/19/17 0504 10/20/17 0437  HGB 8.7* 8.1* 9.5*  HCT 28.0* 26.4* 30.6*  WBC 11.5* 11.5* 15.9*  PLT 382 344 477*    A anemia of critical illness  P lovenox for dvt proph + - PRBC for hgb </= 6.9gm%    - exceptions are   -  if ACS susepcted/confirmed then transfuse for hgb </= 8.0gm%,  or    -  active bleeding with hemodynamic instability, then  transfuse regardless of hemoglobin value   At at all times try to transfuse 1 unit prbc as possible with exception of active hemorrhage    GI  A: On TF  P PPI TF  ID Recent Labs  Lab 10/15/17 0414 10/17/17 0347 10/18/17 0455 10/19/17 0504 10/20/17 0437  WBC 16.6* 14.6* 11.5* 11.5* 15.9*   Recent Labs  Lab 10/19/17 0903 10/20/17 0437  PROCALCITON 0.44 0.45     A Presented with infectious prodrome.    - BAL 8/11 - 49%  PMN, 26% lymphs, 25% Macro. Autoimmune and vasculitis negative 10/17/17. No growth as of 10/20/2017  - repeat Trach aspriate 10/19/17 - due to VAP concern. No growth as of 10/20/2017   on 10/20/2017- fever curve ? better   p Await cultures Anti-infectives (From admission, onward)   Start     Dose/Rate Route Frequency Ordered Stop   10/19/17 1400  ceFEPIme (MAXIPIME) 2 g in sodium chloride 0.9 % 100 mL IVPB     2 g 200 mL/hr over 30 Minutes Intravenous Every 8 hours 10/19/17 1257     10/17/17 2330  vancomycin (VANCOCIN) 1,500 mg in sodium chloride 0.9 % 500 mL IVPB     1,500 mg 250 mL/hr over 120 Minutes Intravenous Every 8 hours 10/17/17 1553     10/17/17 1415  vancomycin (VANCOCIN) 500 mg in sodium chloride 0.9 % 100 mL IVPB     500 mg 100 mL/hr over 60 Minutes Intravenous NOW 10/17/17 1414 10/17/17 1620   10/16/17 1330  vancomycin (VANCOCIN) IVPB 1000 mg/200 mL premix  Status:  Discontinued     1,000 mg 200 mL/hr over 60 Minutes Intravenous Every 8 hours 10/16/17 1253 10/17/17 1553   10/16/17 0000  vancomycin (VANCOCIN) IVPB 1000 mg/200 mL premix  Status:  Discontinued     1,000 mg 200 mL/hr over 60 Minutes Intravenous Every 12 hours 10/15/17 1132 10/15/17 1426   10/16/17 0000  vancomycin (VANCOCIN) IVPB 1000 mg/200 mL premix  Status:  Discontinued     1,000 mg 200 mL/hr over 60 Minutes Intravenous Every 12 hours 10/15/17 1451 10/16/17 1253   10/15/17 1200  vancomycin (VANCOCIN) 1,500 mg in sodium chloride 0.9 % 500 mL IVPB     1,500 mg 250 mL/hr over 120 Minutes Intravenous  Once 10/15/17 1132 10/16/17 1320   10/15/17 1000  levofloxacin (LEVAQUIN) IVPB 750 mg  Status:  Discontinued     750 mg 100 mL/hr over 90 Minutes Intravenous Every 24 hours 10/15/17 0954 10/15/17 1001   10/14/17 1530  Ampicillin-Sulbactam (UNASYN) 3 g in sodium chloride 0.9 % 100 mL IVPB  Status:  Discontinued     3 g 200 mL/hr over 30 Minutes Intravenous Every 6 hours 10/14/17 1525 10/19/17 1258   10/13/17  2000  levofloxacin (LEVAQUIN) IVPB 750 mg  Status:  Discontinued     750 mg 100 mL/hr over 90 Minutes Intravenous  Once 10/13/17 0257 10/13/17 0752   10/13/17 2000  levofloxacin (LEVAQUIN) IVPB 750 mg  Status:  Discontinued     750 mg 100 mL/hr over 90 Minutes Intravenous Every 24 hours 10/13/17 0752 10/13/17 0817   10/13/17 2000  azithromycin (ZITHROMAX) 500 mg in sodium chloride 0.9 % 250 mL IVPB  Status:  Discontinued     500 mg 250 mL/hr over 60 Minutes Intravenous Every 24 hours 10/13/17 0817 10/16/17 1252   10/13/17 1300  cefTRIAXone (ROCEPHIN) 1 g in sodium chloride 0.9 % 100 mL IVPB  Status:  Discontinued  1 g 200 mL/hr over 30 Minutes Intravenous Every 24 hours 10/13/17 1258 10/14/17 1520   10/13/17 1000  cefTRIAXone (ROCEPHIN) 1 g in sodium chloride 0.9 % 100 mL IVPB  Status:  Discontinued     1 g 200 mL/hr over 30 Minutes Intravenous Every 24 hours 10/13/17 0817 10/13/17 1259   10/12/17 2030  levofloxacin (LEVAQUIN) IVPB 750 mg     750 mg 100 mL/hr over 90 Minutes Intravenous  Once 10/12/17 2017 10/12/17 2252        ENDO A No hx of DM  P ICU hyperglycemia protocol  CNS  A: in need of deep sedation due to aRDS.    - 88/15/19 - BIS 30s on fent, versed, diprivan  P -BIS goal < 50 - aim to hold diprivan and assess bis - Nimbex per resp section - fent gtt - versed gtt       FAMILY  Mom not at bedside 10/20/2017. Boyfriend not at bedside 10/20/2017       The patient is critically ill with multiple organ systems failure and requires high complexity decision making for assessment and support, frequent evaluation and titration of therapies, application of advanced monitoring technologies and extensive interpretation of multiple databases.   Critical Care Time devoted to patient care services described in this note is  30  Minutes. This time reflects time of care of this signee Dr Brand Males. This critical care time does not reflect procedure time, or  teaching time or supervisory time of PA/NP/Med student/Med Resident etc but could involve care discussion time    Dr. Brand Males, M.D., [facility].C.P Pulmonary and Critical Care Medicine Staff Physician [facility] Pager: [phone], If no answer or between  15:00h - 7:00h: call [phone]  10/20/2017 9:50 AM

## 2017-10-20 NOTE — Progress Notes (Signed)
 ->   Call from RN . I am rounding in antoher part of hospital  BIS stil < 50 - in 40s - currently in fent gtt and versed gtt. Off diprivan x few hours TOF 2/4 - nimbex holiday for few hours Maintain synchrony on vent  Plan Dc diporivan from [facility]MAR ADjust /lighten sedation but keeping bis < 50   Dr. Kalman ShanMurali Mykale Gandolfo, M.D., [facility]C.P Pulmonary and Critical Care Medicine Staff Physician, [facility] Center Director - Interstitial Lung Disease  Program  Pulmonary Fibrosis [facility] [place], [place], [id number]  Pager: [phone], If no answer or between  15:00h - 7:00h: call [phone] Telephone: [phone]

## 2017-10-20 NOTE — Progress Notes (Signed)
Changed rate to 20 per Dr. Ardeth PerfectJeong at bedside will get another ABG in 30 minutes per DR.

## 2017-10-20 NOTE — Progress Notes (Signed)
eLink Physician-Brief Progress Note Patient Name: Stephanie SchneidersJuliana Young DOB: 10-08-1990 MRN: [id number]   Date of Service  10/20/2017  HPI/Events of Note  Contraction alkalosis   eICU Interventions  Diamox to aid bicarb excretion in urine     Intervention Category Major Interventions: Acid-Base disturbance - evaluation and management  Wilber Oliphantmar Miles Borkowski 10/20/2017, 4:27 AM

## 2017-10-20 NOTE — Progress Notes (Signed)
INFECTIOUS DISEASE PROGRESS NOTE  ID: Stephanie Young is a 27 y.o. female with  Principal Problem:   Multifocal pneumonia Active Problems:   Hypokalemia   Sepsis ([place])   Lobar pneumonia ([place])   Acute respiratory failure with hypoxia ([place])   ARDS (adult respiratory distress syndrome) ([place])  Subjective: On vent, sedation  Abtx:  Anti-infectives (From admission, onward)   Start     Dose/Rate Route Frequency Ordered Stop   10/19/17 1400  ceFEPIme (MAXIPIME) 2 g in sodium chloride 0.9 % 100 mL IVPB     2 g 200 mL/hr over 30 Minutes Intravenous Every 8 hours 10/19/17 1257     10/17/17 2330  vancomycin (VANCOCIN) 1,500 mg in sodium chloride 0.9 % 500 mL IVPB     1,500 mg 250 mL/hr over 120 Minutes Intravenous Every 8 hours 10/17/17 1553     10/17/17 1415  vancomycin (VANCOCIN) 500 mg in sodium chloride 0.9 % 100 mL IVPB     500 mg 100 mL/hr over 60 Minutes Intravenous NOW 10/17/17 1414 10/17/17 1620   10/16/17 1330  vancomycin (VANCOCIN) IVPB 1000 mg/200 mL premix  Status:  Discontinued     1,000 mg 200 mL/hr over 60 Minutes Intravenous Every 8 hours 10/16/17 1253 10/17/17 1553   10/16/17 0000  vancomycin (VANCOCIN) IVPB 1000 mg/200 mL premix  Status:  Discontinued     1,000 mg 200 mL/hr over 60 Minutes Intravenous Every 12 hours 10/15/17 1132 10/15/17 1426   10/16/17 0000  vancomycin (VANCOCIN) IVPB 1000 mg/200 mL premix  Status:  Discontinued     1,000 mg 200 mL/hr over 60 Minutes Intravenous Every 12 hours 10/15/17 1451 10/16/17 1253   10/15/17 1200  vancomycin (VANCOCIN) 1,500 mg in sodium chloride 0.9 % 500 mL IVPB     1,500 mg 250 mL/hr over 120 Minutes Intravenous  Once 10/15/17 1132 10/16/17 1320   10/15/17 1000  levofloxacin (LEVAQUIN) IVPB 750 mg  Status:  Discontinued     750 mg 100 mL/hr over 90 Minutes Intravenous Every 24 hours 10/15/17 0954 10/15/17 1001   10/14/17 1530  Ampicillin-Sulbactam (UNASYN) 3 g in sodium chloride 0.9 % 100 mL IVPB  Status:  Discontinued      3 g 200 mL/hr over 30 Minutes Intravenous Every 6 hours 10/14/17 1525 10/19/17 1258   10/13/17 2000  levofloxacin (LEVAQUIN) IVPB 750 mg  Status:  Discontinued     750 mg 100 mL/hr over 90 Minutes Intravenous  Once 10/13/17 0257 10/13/17 0752   10/13/17 2000  levofloxacin (LEVAQUIN) IVPB 750 mg  Status:  Discontinued     750 mg 100 mL/hr over 90 Minutes Intravenous Every 24 hours 10/13/17 0752 10/13/17 0817   10/13/17 2000  azithromycin (ZITHROMAX) 500 mg in sodium chloride 0.9 % 250 mL IVPB  Status:  Discontinued     500 mg 250 mL/hr over 60 Minutes Intravenous Every 24 hours 10/13/17 0817 10/16/17 1252   10/13/17 1300  cefTRIAXone (ROCEPHIN) 1 g in sodium chloride 0.9 % 100 mL IVPB  Status:  Discontinued     1 g 200 mL/hr over 30 Minutes Intravenous Every 24 hours 10/13/17 1258 10/14/17 1520   10/13/17 1000  cefTRIAXone (ROCEPHIN) 1 g in sodium chloride 0.9 % 100 mL IVPB  Status:  Discontinued     1 g 200 mL/hr over 30 Minutes Intravenous Every 24 hours 10/13/17 0817 10/13/17 1259   10/12/17 2030  levofloxacin (LEVAQUIN) IVPB 750 mg     750 mg 100 mL/hr over  90 Minutes Intravenous  Once 10/12/17 2017 10/12/17 2252      Medications:  Scheduled: . acetaZOLAMIDE  500 mg Intravenous Q6H  . albuterol  2.5 mg Nebulization Q4H  . artificial tears  1 application Both Eyes B3Z  . chlorhexidine gluconate (MEDLINE KIT)  15 mL Mouth Rinse BID  . Chlorhexidine Gluconate Cloth  6 each Topical Daily  . enoxaparin (LOVENOX) injection  40 mg Subcutaneous Daily  . feeding supplement (PRO-STAT SUGAR FREE 64)  30 mL Per Tube TID  . free water  100 mL Per Tube Q4H  . furosemide  40 mg Intravenous TID  . insulin aspart  2-6 Units Subcutaneous Q4H  . mouth rinse  15 mL Mouth Rinse 10 times per day  . multivitamin  15 mL Per Tube Daily  . pantoprazole sodium  40 mg Per Tube Daily  . potassium chloride  40 mEq Per Tube TID  . sodium chloride flush  10-40 mL Intracatheter Q12H     Objective: Vital signs in last 24 hours: Temp:  [98.2 F (36.8 C)-102.2 F (39 C)] 98.2 F (36.8 C) (08/15 0721) Pulse Rate:  [100-116] 100 (08/15 0715) Resp:  [4-28] 20 (08/15 0900) BP: (93-139)/(47-79) 139/68 (08/15 0900) SpO2:  [82 %-100 %] 92 % (08/15 0900) Arterial Line BP: (102-168)/(44-86) 142/68 (08/15 0900) FiO2 (%):  [50 %-80 %] 50 % (08/15 0800) Weight:  [76.2 kg] 76.2 kg (08/15 0600)   General appearance: no distress Resp: diminished breath sounds anterior - bilateral and rhonchi anterior - bilateral Cardio: regular rate and rhythm GI: normal findings: bowel sounds normal and soft, non-tender Extremities: edema none  Lab Results Recent Labs    10/19/17 0504 10/19/17 0903 10/20/17 0000 10/20/17 0437  WBC 11.5*  --   --  15.9*  HGB 8.1*  --   --  9.5*  HCT 26.4*  --   --  30.6*  NA  --  142 140  --   K  --  3.5 3.8  --   CL  --  96* 88*  --   CO2  --  39* 40*  --   BUN  --  12 18  --   CREATININE  --  0.43* 0.56  --    Liver Panel Recent Labs    10/18/17 0455  PROT 5.4*  ALBUMIN 1.6*  AST 20  ALT 13  ALKPHOS 47  BILITOT 0.5  BILIDIR 0.2  IBILI 0.3   Sedimentation Rate No results for input(s): ESRSEDRATE in the last 72 hours. C-Reactive Protein No results for input(s): CRP in the last 72 hours.  Microbiology: Recent Results (from the past 240 hour(s))  Blood culture (routine x 2)     Status: None   Collection Time: 10/12/17  8:20 PM  Result Value Ref Range Status   Specimen Description BLOOD RIGHT ANTECUBITAL  Final   Special Requests   Final    BOTTLES DRAWN AEROBIC AND ANAEROBIC Blood Culture adequate volume   Culture   Final    NO GROWTH 5 DAYS Performed at [facility], [address], [place], [place] [address]    Report Status 10/17/2017 FINAL  Final  Blood culture (routine x 2)     Status: None   Collection Time: 10/12/17  8:37 PM  Result Value Ref Range Status   Specimen Description BLOOD SITE NOT SPECIFIED  Final    Special Requests   Final    BOTTLES DRAWN AEROBIC AND ANAEROBIC Blood Culture results  may not be optimal due to an excessive volume of blood received in culture bottles   Culture   Final    NO GROWTH 5 DAYS Performed at [facility], [place] [address] [place], [place] [address]    Report Status 10/17/2017 FINAL  Final  Respiratory Panel by PCR     Status: None   Collection Time: 10/13/17  8:40 AM  Result Value Ref Range Status   Adenovirus NOT DETECTED NOT DETECTED Final   Coronavirus 229E NOT DETECTED NOT DETECTED Final   Coronavirus HKU1 NOT DETECTED NOT DETECTED Final   Coronavirus NL63 NOT DETECTED NOT DETECTED Final   Coronavirus OC43 NOT DETECTED NOT DETECTED Final   Metapneumovirus NOT DETECTED NOT DETECTED Final   Rhinovirus / Enterovirus NOT DETECTED NOT DETECTED Final   Influenza A NOT DETECTED NOT DETECTED Final   Influenza B NOT DETECTED NOT DETECTED Final   Parainfluenza Virus 1 NOT DETECTED NOT DETECTED Final   Parainfluenza Virus 2 NOT DETECTED NOT DETECTED Final   Parainfluenza Virus 3 NOT DETECTED NOT DETECTED Final   Parainfluenza Virus 4 NOT DETECTED NOT DETECTED Final   Respiratory Syncytial Virus NOT DETECTED NOT DETECTED Final   Bordetella pertussis NOT DETECTED NOT DETECTED Final   Chlamydophila pneumoniae NOT DETECTED NOT DETECTED Final   Mycoplasma pneumoniae NOT DETECTED NOT DETECTED Final    Comment: Performed at [facility], [place] [address] [place], [place] [address]  Culture, sputum-assessment     Status: None   Collection Time: 10/14/17 12:30 PM  Result Value Ref Range Status   Specimen Description EXPECTORATED SPUTUM  Final   Special Requests NONE  Final   Sputum evaluation   Final    Sputum specimen not acceptable for testing.  Please recollect.   Results Called to: .C. OKEEFE, RN AT 1616 ON 10/14/17 BY C. JESSUP, MLT. Performed at [facility], [place] [address] [place], [place] [address]    Report Status 10/14/2017 FINAL  Final   Respiratory Panel by PCR     Status: None   Collection Time: 10/14/17  3:59 PM  Result Value Ref Range Status   Adenovirus NOT DETECTED NOT DETECTED Final   Coronavirus 229E NOT DETECTED NOT DETECTED Final   Coronavirus HKU1 NOT DETECTED NOT DETECTED Final   Coronavirus NL63 NOT DETECTED NOT DETECTED Final   Coronavirus OC43 NOT DETECTED NOT DETECTED Final   Metapneumovirus NOT DETECTED NOT DETECTED Final   Rhinovirus / Enterovirus NOT DETECTED NOT DETECTED Final   Influenza A NOT DETECTED NOT DETECTED Final   Influenza B NOT DETECTED NOT DETECTED Final   Parainfluenza Virus 1 NOT DETECTED NOT DETECTED Final   Parainfluenza Virus 2 NOT DETECTED NOT DETECTED Final   Parainfluenza Virus 3 NOT DETECTED NOT DETECTED Final   Parainfluenza Virus 4 NOT DETECTED NOT DETECTED Final   Respiratory Syncytial Virus NOT DETECTED NOT DETECTED Final   Bordetella pertussis NOT DETECTED NOT DETECTED Final   Chlamydophila pneumoniae NOT DETECTED NOT DETECTED Final   Mycoplasma pneumoniae NOT DETECTED NOT DETECTED Final    Comment: Performed at [facility], [place] [address] [place], [place] [address]  MRSA PCR Screening     Status: None   Collection Time: 10/15/17 11:43 AM  Result Value Ref Range Status   MRSA by PCR NEGATIVE NEGATIVE Final    Comment:        The GeneXpert MRSA Assay (FDA approved for NASAL specimens only), is one component of a  comprehensive MRSA colonization surveillance program. It is not intended to diagnose MRSA infection nor to guide or monitor treatment for MRSA infections. Performed at [facility], [place] [address], [place], [place] [address]   Culture, respiratory (non-expectorated)     Status: None   Collection Time: 10/16/17  1:20 PM  Result Value Ref Range Status   Specimen Description BRONCHIAL ALVEOLAR LAVAGE  Final   Special Requests NONE  Final   Gram Stain   Final    RARE WBC PRESENT, PREDOMINANTLY MONONUCLEAR NO ORGANISMS SEEN    Culture    Final    NO GROWTH 2 DAYS Performed at [facility], [place] [address] [place], [place] [address]    Report Status 10/18/2017 FINAL  Final  Fungus Culture With Stain     Status: None (Preliminary result)   Collection Time: 10/16/17  1:20 PM  Result Value Ref Range Status   Fungus Stain Final report  Final    Comment: (NOTE) Performed At: [facility] [place], [place] [id number] Rush Farmer MD IH:[id number]    Fungus (Mycology) Culture PENDING  Incomplete   Fungal Source BRONCHIAL ALVEOLAR LAVAGE  Final    Comment: Performed at [facility], [place] [address] [place], [place] [address]  Acid Fast Smear (AFB)     Status: None   Collection Time: 10/16/17  1:20 PM  Result Value Ref Range Status   AFB Specimen Processing Concentration  Final   Acid Fast Smear Negative  Final    Comment: (NOTE) Performed At: [facility] [place], [place] [id number] Rush Farmer MD YS:[id number]    Source (AFB) BRONCHIAL ALVEOLAR LAVAGE  Final    Comment: Performed at [facility], [place] [address], [place], [place] [address]  Fungus Culture Result     Status: None   Collection Time: 10/16/17  1:20 PM  Result Value Ref Range Status   Result 1 Comment  Final    Comment: (NOTE) KOH/Calcofluor preparation:  no fungus observed. Performed At: [facility] [address] [place], [place] [id number] Rush Farmer MD [id number]   Legionella, DFA (w/out culture)     Status: None   Collection Time: 10/16/17  2:08 PM  Result Value Ref Range Status   Legionella Pneumophila DFA Negative Negative Final    Comment: (NOTE) Performed At: [facility] [address] [place], [place] [id number] Rush Farmer MD [id number]   Culture, blood (routine x 2)     Status: None (Preliminary result)   Collection Time: 10/17/17  6:46 AM  Result Value Ref Range Status   Specimen Description BLOOD RIGHT ANTECUBITAL  Final   Special  Requests   Final    BOTTLES DRAWN AEROBIC AND ANAEROBIC Blood Culture adequate volume   Culture   Final    NO GROWTH 2 DAYS Performed at [facility], [address], [place], [place] [address]    Report Status PENDING  Incomplete  Culture, blood (routine x 2)     Status: None (Preliminary result)   Collection Time: 10/17/17  6:46 AM  Result Value Ref Range Status   Specimen Description BLOOD LEFT HAND  Final   Special Requests   Final    BOTTLES DRAWN AEROBIC AND ANAEROBIC Blood Culture adequate volume   Culture   Final    NO GROWTH 2 DAYS Performed at [facility], [place] [address] [place], [place] [address]    Report Status PENDING  Incomplete  Culture, respiratory (non-expectorated)     Status: None (Preliminary result)   Collection Time: 10/19/17  1:27 PM  Result Value Ref Range Status   Specimen Description TRACHEAL ASPIRATE  Final   Special Requests NONE  Final   Gram Stain   Final    RARE WBC PRESENT,BOTH PMN AND MONONUCLEAR NO ORGANISMS SEEN Performed at [facility], [address] [place], [place] [address]    Culture PENDING  Incomplete   Report Status PENDING  Incomplete    Studies/Results: Dg Chest Port 1 View  Result Date: 10/20/2017 CLINICAL DATA:  Shortness of breath.  ETT. EXAM: PORTABLE CHEST 1 VIEW COMPARISON:  October 19, 2017 FINDINGS: The ETT is in good position. The right PICC line has been pulled back and terminates in the central SVC. No pneumothorax. Diffuse bilateral pulmonary opacities persist but have improved. No other interval changes. The NG tube terminates in the stomach. IMPRESSION: 1. Support apparatus as above. 2. Persistent but improving diffuse bilateral pulmonary opacities. Electronically Signed   By: Dorise Bullion III M.D   On: 10/20/2017 07:04   Dg Chest Port 1 View  Result Date: 10/19/2017 CLINICAL DATA:  Endotracheal tube present. EXAM: PORTABLE CHEST 1 VIEW COMPARISON:  Radiograph of October 18, 2017. FINDINGS:  Stable cardiomediastinal silhouette. Endotracheal tube is less than 1 cm above the carina. Nasogastric tube is seen coiled within proximal stomach. No pneumothorax is noted. Stable bilateral lung airspace opacities are noted concerning for pneumonia or possibly edema. Right-sided PICC line is noted with tip in right atrium. Bony thorax is unremarkable. IMPRESSION: Stable bilateral diffuse airspace opacities are noted concerning for pneumonia or possibly edema. Endotracheal tube is less than 1 cm above the carina; withdrawal by 2-3 cm is recommended. Right-sided PICC line is noted with tip in right atrium; withdrawal by 3-4 cm is recommended. These results will be called to the ordering clinician or representative by the Radiologist Assistant, and communication documented in the PACS or zVision Dashboard. Electronically Signed   By: Marijo Conception, M.D.   On: 10/19/2017 07:19     Assessment/Plan: CAP VDRF (intubated on 8-11) ARDS  Total days of antibiotics:5 vanco/unasyn  appears to be improving- CXR better, fever better.  Assuming that most of her illness now is ARDS Would narrow anbx at 8 days, unasyn alone (augmentin) to complete 14 days.  Available as needed.          Bobby Rumpf MD, FACP Infectious Diseases (pager) [phone] [email or web address] 10/20/2017, 9:55 AM  LOS: 6 days

## 2017-10-20 NOTE — Progress Notes (Addendum)
Nutrition Follow-up  DOCUMENTATION CODES:   Not applicable  INTERVENTION:   Change TF formula:  Vital AF 1.2 at 50 ml/h (1200 ml per day)  Pro-stat 30 ml BID  Provides 1640 kcal, 120 gm protein, 973 ml free water daily  NUTRITION DIAGNOSIS:   Inadequate oral intake related to acute illness as evidenced by NPO status(pt sedated and ventilated ).  Ongoing  GOAL:   Provide needs based on ASPEN/SCCM guidelines  Met with TF  MONITOR:   Vent status, Labs, Weight trends, TF tolerance, Skin, I & O's  ASSESSMENT:   27 y/o female admitted with PNA and ARDS   Discussed patient with RN today. She is tolerating TF well to meet nutrition needs. Vital High Protein at 40 ml/h with Pro-stat 30 ml TID providing 1260 kcal, 129 gm protein, 803 ml free water per day. Also receiving free water flushes 100 ml every 4 hours. Patient is currently intubated on ventilator support MV: 6.9 L/min Temp (24hrs), Avg:99 F (37.2 C), Min:98.1 F (36.7 C), Max:102.2 F (39 C)  Propofol: discontinued today Labs reviewed. Phosphorus 5.1 (H) CBG's: 129-124-101-110 Medications reviewed and include Lasix, MVI, Novolog, KCl. Nimbex weaning off today.   Diet Order:   Diet Order    None      EDUCATION NEEDS:   No education needs have been identified at this time  Skin:  Skin Assessment: Reviewed RN Assessment  Last BM:  8/15 (type 7)  Height:   Ht Readings from Last 1 Encounters:  10/13/17 5' 5" (1.651 m)    Weight:   Wt Readings from Last 1 Encounters:  10/20/17 76.2 kg    Ideal Body Weight:  56.8 kg  BMI:  Body mass index is 27.96 kg/m.  Estimated Nutritional Needs:   Kcal:  1660  Protein:  110-130 gm  Fluid:  >1.7 L    Molli Barrows, RD, LDN, [place] Pager [phone] After Hours Pager [phone]

## 2017-10-20 NOTE — Progress Notes (Signed)
[facility] CRITICAL CARE PROGRESS NOTE  CTSP re: ABG results. On ARDS-directed lung protective ventilator settings: PRVC 28/350/60/10 on paralytic and sedation.  ABG    Component Value Date/Time   PHART 7.530 (H) 10/20/2017 0324   PCO2ART 53.2 (H) 10/20/2017 0324   PO2ART 127.0 (H) 10/20/2017 0324   HCO3 44.5 (H) 10/20/2017 0324   TCO2 46 (H) 10/20/2017 0324   O2SAT 99.0 10/20/2017 0324   Notable for overall improvement in P:F ratio and METABOLIC ALKALOSIS w/"incomplete" respiratory compensation, likely owing to furosemide 80 mg IV q 8 hr and "hyperventilation" due to high minute ventilation.  Decrease ventilator set rate to 20. Decrease FiO2 to 50%. Recheck ABG in 30 min.  Marcelle SmilingSeong-Joo Keshayla Schrum, MD Board Certified by the ABIM, Pulmonary Diseases & Critical Care Medicine  10/20/2017 3:50 AM

## 2017-10-21 ENCOUNTER — Inpatient Hospital Stay (HOSPITAL_COMMUNITY): Payer: Self-pay

## 2017-10-21 LAB — HYPERSENSITIVITY PNEUMONITIS
A. Pullulans Abs: NEGATIVE
A.Fumigatus #1 Abs: NEGATIVE
MICROPOLYSPORA FAENI IGG: NEGATIVE
PIGEON SERUM ABS: NEGATIVE
THERMOACT. SACCHARII: NEGATIVE
Thermoactinomyces vulgaris, IgG: NEGATIVE

## 2017-10-21 LAB — BASIC METABOLIC PANEL
Anion gap: 9 (ref 5–15)
Anion gap: 7 (ref 5–15)
BUN: 30 mg/dL — AB (ref 6–20)
BUN: 28 mg/dL — ABNORMAL HIGH (ref 6–20)
CHLORIDE: 104 mmol/L (ref 98–111)
CO2: 33 mmol/L — ABNORMAL HIGH (ref 22–32)
CO2: 29 mmol/L (ref 22–32)
CREATININE: 0.53 mg/dL (ref 0.44–1.00)
Calcium: 8.9 mg/dL (ref 8.9–10.3)
Calcium: 8.4 mg/dL — ABNORMAL LOW (ref 8.9–10.3)
Chloride: 99 mmol/L (ref 98–111)
Creatinine, Ser: 0.57 mg/dL (ref 0.44–1.00)
GFR calc Af Amer: >60 mL/min (ref >60)
GFR calc Af Amer: >60 mL/min (ref >60)
GLUCOSE: 118 mg/dL — AB (ref 70–99)
Glucose, Bld: 118 mg/dL — ABNORMAL HIGH (ref 70–99)
POTASSIUM: 3.6 mmol/L (ref 3.5–5.1)
Potassium: 4.4 mmol/L (ref 3.5–5.1)
SODIUM: 141 mmol/L (ref 135–145)
Sodium: 140 mmol/L (ref 135–145)

## 2017-10-21 LAB — GLUCOSE, CAPILLARY
GLUCOSE-CAPILLARY: 102 mg/dL — AB (ref 70–99)
Glucose-Capillary: 132 mg/dL — ABNORMAL HIGH (ref 70–99)
Glucose-Capillary: 119 mg/dL — ABNORMAL HIGH (ref 70–99)
Glucose-Capillary: 129 mg/dL — ABNORMAL HIGH (ref 70–99)
Glucose-Capillary: 96 mg/dL (ref 70–99)
Glucose-Capillary: 108 mg/dL — ABNORMAL HIGH (ref 70–99)

## 2017-10-21 LAB — TRIGLYCERIDES: Triglycerides: 129 mg/dL (ref <150)

## 2017-10-21 LAB — CULTURE, RESPIRATORY: CULTURE: NO GROWTH

## 2017-10-21 LAB — CBC WITH DIFFERENTIAL/PLATELET
BASOS ABS: 0 10*3/uL (ref 0.0–0.1)
BASOS PCT: 0 %
EOS ABS: 0.3 10*3/uL (ref 0.0–0.7)
Eosinophils Relative: 2 %
HCT: 32.4 % — ABNORMAL LOW (ref 36.0–46.0)
HEMOGLOBIN: 9.4 g/dL — AB (ref 12.0–15.0)
LYMPHS ABS: 0.8 10*3/uL (ref 0.7–4.0)
Lymphocytes Relative: 6 %
MCH: 27.3 pg (ref 26.0–34.0)
MCHC: 29 g/dL — AB (ref 30.0–36.0)
MCV: 94.2 fL (ref 78.0–100.0)
MONO ABS: 0 10*3/uL — AB (ref 0.1–1.0)
Monocytes Relative: 0 %
NEUTROS ABS: 11.7 10*3/uL — AB (ref 1.7–7.7)
Neutrophils Relative %: 92 %
Platelets: 491 10*3/uL — ABNORMAL HIGH (ref 150–400)
RBC: 3.44 MIL/uL — ABNORMAL LOW (ref 3.87–5.11)
RDW: 14.3 % (ref 11.5–15.5)
WBC: 12.8 10*3/uL — ABNORMAL HIGH (ref 4.0–10.5)

## 2017-10-21 LAB — CULTURE, RESPIRATORY W GRAM STAIN

## 2017-10-21 LAB — MAGNESIUM: MAGNESIUM: 2.2 mg/dL (ref 1.7–2.4)

## 2017-10-21 LAB — PROCALCITONIN: PROCALCITONIN: 0.34 ng/mL

## 2017-10-21 LAB — PHOSPHORUS: PHOSPHORUS: 4.3 mg/dL (ref 2.5–4.6)

## 2017-10-21 MED ORDER — PROPOFOL 1000 MG/100ML IV EMUL
5.0000 ug/kg/min | INTRAVENOUS | Status: DC
  Administered 2017-10-21: 60 ug/kg/min via INTRAVENOUS
  Administered 2017-10-21: 80 ug/kg/min via INTRAVENOUS
  Administered 2017-10-21: 60 ug/kg/min via INTRAVENOUS
  Administered 2017-10-21: 30 ug/kg/min via INTRAVENOUS
  Administered 2017-10-22: 60 ug/kg/min via INTRAVENOUS
  Administered 2017-10-22: 70 ug/kg/min via INTRAVENOUS
  Administered 2017-10-22: 50 ug/kg/min via INTRAVENOUS
  Filled 2017-10-21: qty 100
  Filled 2017-10-21: qty 200
  Filled 2017-10-21 (×4): qty 100

## 2017-10-21 MED ORDER — CHLORHEXIDINE GLUCONATE CLOTH 2 % EX PADS
6.0000 | MEDICATED_PAD | Freq: Every day | CUTANEOUS | Status: DC
  Administered 2017-10-22 – 2017-11-01 (×10): 6 via TOPICAL

## 2017-10-21 MED ORDER — ROCURONIUM BROMIDE 50 MG/5ML IV SOLN
70.0000 mg | Freq: Once | INTRAVENOUS | Status: AC
  Administered 2017-10-21: 70 mg via INTRAVENOUS
  Filled 2017-10-21: qty 7

## 2017-10-21 MED ORDER — DEXMEDETOMIDINE HCL IN NACL 400 MCG/100ML IV SOLN
0.0000 ug/kg/h | INTRAVENOUS | Status: DC
  Administered 2017-10-21: 0.4 ug/kg/h via INTRAVENOUS
  Filled 2017-10-21: qty 100

## 2017-10-21 MED ORDER — HYDROMORPHONE BOLUS VIA INFUSION
0.5000 mg | INTRAVENOUS | Status: DC | PRN
  Administered 2017-10-21 – 2017-10-22 (×2): 2 mg via INTRAVENOUS
  Filled 2017-10-21: qty 2

## 2017-10-21 MED ORDER — SODIUM CHLORIDE 0.9 % IV SOLN
0.2500 mg/h | INTRAVENOUS | Status: DC
  Administered 2017-10-21: 2 mg/h via INTRAVENOUS
  Administered 2017-10-22: 3 mg/h via INTRAVENOUS
  Filled 2017-10-21 (×2): qty 5

## 2017-10-21 NOTE — Plan of Care (Signed)
  Problem: Clinical Measurements: Goal: Ability to maintain clinical measurements within normal limits will improve Outcome: Progressing Goal: Will remain free from infection Outcome: Progressing Goal: Diagnostic test results will improve Outcome: Progressing Goal: Respiratory complications will improve Outcome: Progressing Goal: Cardiovascular complication will be avoided Outcome: Progressing   Problem: Activity: Goal: Risk for activity intolerance will decrease Outcome: Progressing   Problem: Nutrition: Goal: Adequate nutrition will be maintained Outcome: Progressing   Problem: Coping: Goal: Level of anxiety will decrease Outcome: Progressing   Problem: Elimination: Goal: Will not experience complications related to bowel motility Outcome: Progressing Goal: Will not experience complications related to urinary retention Outcome: Progressing   Problem: Pain Managment: Goal: General experience of comfort will improve Outcome: Progressing   Problem: Safety: Goal: Ability to remain free from injury will improve Outcome: Progressing   Problem: Skin Integrity: Goal: Risk for impaired skin integrity will decrease Outcome: Progressing   Problem: Respiratory: Goal: Ability to maintain adequate ventilation will improve Outcome: Progressing Goal: Ability to maintain a clear airway will improve Outcome: Progressing   Problem: Role Relationship: Goal: Method of communication will improve Outcome: Progressing

## 2017-10-21 NOTE — Progress Notes (Signed)
Discussed patient with family, boyfriend and mother. Boyfriend states she has several month history of vape use.   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 Case and history [facility]. Will contact the family for more information.   They had limited advice for treatment besides therapeutic care. One suggested treatment from observation alone was improvement with the use of steroids.      Versie StarksSeawell, Giovana Faciane A, DO 10/21/2017, 5:21 PM Pager: [phone]

## 2017-10-21 NOTE — Progress Notes (Signed)
HPI: 27yo female with no significant PMH presenting 8/8 with cough, SOB and CP, diagnosed with ARDS, sepsis, and multifocal pneumonia with negative blood and respiratory cultures.   Subjective: Pt sedated and paralyzed earlier this am with vent synchrony. Attempted to wean propofol to precedex but pt easily roused on precedex. Switched back to  Prop & versed.   Objective:  Vital signs in last 24 hours: Vitals:   10/21/17 1100 10/21/17 1147 10/21/17 1200 10/21/17 1300  BP: (!) 118/56  (!) 107/42 (!) 111/59  Pulse: 97     Resp: 20  19 (!) 21  Temp:  98.7 F (37.1 C)    TempSrc:  Oral    SpO2: 97%  98% 99%  Weight:      Height:       Constitution: supine in bed, sedated and intubated Cardio: RRR, no murmurs, normal S1 & S2 Respiratory: clear to auscultation, ventilator synchrony  Abdominal: +BS, non-distended, soft MSK: no edema, warm UE & LE, positive pedal & radial pulses GU: catheter in place  Skin: clean, dry, intact  CBC Latest Ref Rng & Units 10/21/2017 10/20/2017 10/19/2017  WBC 4.0 - 10.5 K/uL 12.8(H) 15.9(H) 11.5(H)  Hemoglobin 12.0 - 15.0 g/dL 9.4(L) 9.5(L) 8.1(L)  Hematocrit 36.0 - 46.0 % 32.4(L) 30.6(L) 26.4(L)  Platelets 150 - 400 K/uL 491(H) 477(H) 344   Anti-infectives (From admission, onward)   Start     Dose/Rate Route Frequency Ordered Stop   10/19/17 1400  ceFEPIme (MAXIPIME) 2 g in sodium chloride 0.9 % 100 mL IVPB     2 g 200 mL/hr over 30 Minutes Intravenous Every 8 hours 10/19/17 1257     10/17/17 2330  vancomycin (VANCOCIN) 1,500 mg in sodium chloride 0.9 % 500 mL IVPB  Status:  Discontinued     1,500 mg 250 mL/hr over 120 Minutes Intravenous Every 8 hours 10/17/17 1553 10/21/17 0944   10/17/17 1415  vancomycin (VANCOCIN) 500 mg in sodium chloride 0.9 % 100 mL IVPB     500 mg 100 mL/hr over 60 Minutes Intravenous NOW 10/17/17 1414 10/17/17 1620   10/16/17 1330  vancomycin (VANCOCIN) IVPB 1000 mg/200 mL premix  Status:  Discontinued     1,000 mg 200  mL/hr over 60 Minutes Intravenous Every 8 hours 10/16/17 1253 10/17/17 1553   10/16/17 0000  vancomycin (VANCOCIN) IVPB 1000 mg/200 mL premix  Status:  Discontinued     1,000 mg 200 mL/hr over 60 Minutes Intravenous Every 12 hours 10/15/17 1132 10/15/17 1426   10/16/17 0000  vancomycin (VANCOCIN) IVPB 1000 mg/200 mL premix  Status:  Discontinued     1,000 mg 200 mL/hr over 60 Minutes Intravenous Every 12 hours 10/15/17 1451 10/16/17 1253   10/15/17 1200  vancomycin (VANCOCIN) 1,500 mg in sodium chloride 0.9 % 500 mL IVPB     1,500 mg 250 mL/hr over 120 Minutes Intravenous  Once 10/15/17 1132 10/16/17 1320   10/15/17 1000  levofloxacin (LEVAQUIN) IVPB 750 mg  Status:  Discontinued     750 mg 100 mL/hr over 90 Minutes Intravenous Every 24 hours 10/15/17 0954 10/15/17 1001   10/14/17 1530  Ampicillin-Sulbactam (UNASYN) 3 g in sodium chloride 0.9 % 100 mL IVPB  Status:  Discontinued     3 g 200 mL/hr over 30 Minutes Intravenous Every 6 hours 10/14/17 1525 10/19/17 1258   10/13/17 2000  levofloxacin (LEVAQUIN) IVPB 750 mg  Status:  Discontinued     750 mg 100 mL/hr over 90 Minutes Intravenous  Once 10/13/17 0257 10/13/17 0752   10/13/17 2000  levofloxacin (LEVAQUIN) IVPB 750 mg  Status:  Discontinued     750 mg 100 mL/hr over 90 Minutes Intravenous Every 24 hours 10/13/17 0752 10/13/17 0817   10/13/17 2000  azithromycin (ZITHROMAX) 500 mg in sodium chloride 0.9 % 250 mL IVPB  Status:  Discontinued     500 mg 250 mL/hr over 60 Minutes Intravenous Every 24 hours 10/13/17 0817 10/16/17 1252   10/13/17 1300  cefTRIAXone (ROCEPHIN) 1 g in sodium chloride 0.9 % 100 mL IVPB  Status:  Discontinued     1 g 200 mL/hr over 30 Minutes Intravenous Every 24 hours 10/13/17 1258 10/14/17 1520   10/13/17 1000  cefTRIAXone (ROCEPHIN) 1 g in sodium chloride 0.9 % 100 mL IVPB  Status:  Discontinued     1 g 200 mL/hr over 30 Minutes Intravenous Every 24 hours 10/13/17 0817 10/13/17 1259   10/12/17 2030   levofloxacin (LEVAQUIN) IVPB 750 mg     750 mg 100 mL/hr over 90 Minutes Intravenous  Once 10/12/17 2017 10/12/17 2252     Vent Settings Vent Mode: PRVC FiO2 (%):  [40 %] 40 % Set Rate:  [20 bmp] 20 bmp Vt Set:  [350 mL] 350 mL PEEP:  [8 cmH20-10 cmH20] 8 cmH20 Plateau Pressure:  [18 cmH20-25 cmH20] 18 cmH20  . sodium chloride 10 mL/hr at 10/21/17 1800  . ceFEPime (MAXIPIME) IV Stopped (10/21/17 1409)  . feeding supplement (VITAL AF 1.2 CAL) 50 mL/hr at 10/21/17 1800  . HYDROmorphone 3 mg/hr (10/21/17 1800)  . midazolam (VERSED) infusion 6 mg/hr (10/21/17 1800)  . propofol (DIPRIVAN) infusion 60 mcg/kg/min (10/21/17 1800)     Assessment/Plan:  Principal Problem:   Multifocal pneumonia Active Problems:   Hypokalemia   Sepsis ([place])   Lobar pneumonia (HCC)   Acute respiratory failure with hypoxia (HCC)   ARDS (adult respiratory distress syndrome) (H[facility])   Idiopathic ARDS: Intubated 8/11 for worsening dyspnea. cxr today showing some improvement in left sided infiltrate. PEEP 10, FiO2 40%. Given lasix x 2days with expected BUN increase. Case discussed with family and patient reported uses vape. Attempted switch from propofol to precedex drip today but unable to keep patient sedated. BAL still with no growth, all previous cxs negative, autoimmune & vasculitis workup negative.   - Continue propofol & versed - triglycerides 129 - cont. Lasix today 40 tid - call  poison control due to recent severe pulmonary disease cases attributed to vaping - am CBC, cxr  Pneumonia: CBC 12.8 from 15.9 yesterday. Fevers have resolved overnight, possibly were secondary to unasyn, which was stopped yesterday.   - cont. Cefepime - d/c vanc   Contraction Alkalosis: resolved  - discontinue diamox - am BMP - am ABG   Anemia: secondary to recent illness, No noted source of bleeding. Hb 9.4 < 9.5 yesterday.   - am CBC, monitoring   Dispo: Discussed seriousness of case with family and that  patient needs to no longer use vape with recovery.   Molli Hazard A, DO 10/21/2017, 2:06 PM Pager: [phone]

## 2017-10-21 NOTE — Care Management Note (Signed)
Case Management Note  Patient Details  Name: Stephanie Young MRN: [id number] Date of Birth: 03-May-1990  Subjective/Objective:   Pt admitted with SOB - found to have multifocal PNA                 Action/Plan:  Pt remains on vent with ARDs protocol   Expected Discharge Date:                  Expected Discharge Plan:  Home/Self Care  In-House Referral:     Discharge planning Services  CM Consult  Post Acute Care Choice:    Choice offered to:     DME Arranged:    DME Agency:     HH Arranged:    HH Agency:     Status of Service:     If discussed at [employer] Meetings, dates discussed:    Additional Comments:  Stephanie Young, Stephanie Belk S, RN 10/21/2017, 1:52 PM

## 2017-10-21 NOTE — Progress Notes (Signed)
PULMONARY / CRITICAL CARE MEDICINE   Name: Stephanie Young MRN: [id number] DOB: 11-Nov-1990    ADMISSION DATE:  10/12/2017 CONSULTATION DATE:  10/16/17  LOS 7 days  REFERRING MD: Dr. Wynelle Cleveland  CHIEF COMPLAINT:  Worsening dyspnea  Brief : Stephanie Young is a 27 y.o. female without significant past medical history, who presents with a cough, shortness of breath and chest pain.  Patient states that she has been having cough, shortness of breath and chest pain for almost 4 days. She coughs up clear mucus. Her chest pain is located in the frontal chest, sharp, mild, nonradiating, pleuritic, aggravated by deep breath and coughing. Patient has a subjective fever and chills. She states she has nausea and vomited several times, which has resolved. Currently patient does not have nausea, vomiting, diarrhea or abdominal pain. No symptoms of UTI. No unilateral weakness.  ED Course: pt was found to have  WBC 16.8, lactic acid 0.84, negative pregnancy test, potassium 3.3, creatinine normal, temperature 99.7, tachycardia, tachypnea, O2 sat are 96% on room air. CT angiogram is negative for PE, but showed multifocal pneumonia. Patient is placed on telemetry bed for observation.  8/11  I was asked to see the patient today for worsening respiratory distress.  She appears quite tired and has increased work of breathing. Has a temp today of 100.7 The patient has a Pa02 of 57 on a NRM. Her recent CT scan chest shows multifocal infitlrates Her urine for Strep and Legionella was negative. Her MRSA nasal spec was negative for MRSA. Her CXR appears much worse today than iot did on 8/7. WBC is 16K and Procal 0.56. The patient has no hitory of recreational drug use, recent travel. Had a negative HIV test.The patient is on no immunosuppressive therapy.  She does not smoke. Ha s most recently been on Azithromax, Unasyn and Vanco. The patient has been seen by ID. Gyn bateria - negative RVP negative    10/17/2017 -  4102mg fent + 80 diprivan + 0.573mversed. -> arousable but then desatus easy. Follows commands. On vent. S/p bronch yesterday -> 49% polys, 26% lymphs, 40% fio2, peep 10, -Significant vent dysnchroncy yesterday is improved.   Per RN [place]   10/18/17 - fentanyl, diprivan and versed gtt /. On Unasynm and Vanc. Fever curve trending down. CXR and pulse ox better after lasx x 1 yesterday . +17.6L since admit   8/14 - diuresed and now +16+ L but cxr worse, hyppoxemia worse with fio2 at 100%. On versed 1027mnd fent 400m30m RASS -3. - opens eyes when stimulated per RN. BIs 95 currently. Abx changed - repeat culture trach, increase lasix, start nimbex   8/15 - fever improved vnadir before respike. Down to +14L with high dose lasix. Now on diamox for contraction alk. CXR vastly better. Not on pressors. On fent gtt, versed, and diprivan -> BIS 35. On nimbex. -. TOF 0/4  On fio2 50%/peep 10 -> pulse ox 95%    SUBJECTIVE/OVERNIGHT/INTERVAL HX 8/16 - 40% fio2/peep 10, On fent , versed gtt. Nimbex holiday during day but restarted overnight due to vent dysnchrony. Not on pressors. BIS in 30s. Fluid balance +14L  After lasix dropped. Fever - resolved after abx unasun chagned to cefepime. No growtih from BAL 8/11 and trach aspirate 10/19/17   VITAL SIGNS: BP (!) 141/69 (BP Location: Left Arm)   Pulse 96  Temp 98.7 F (37.1 C) (Oral)   Resp 20   Ht _0  (1.651 m)   Wt 76.2 kg   SpO2 97%   BMI 27.96 kg/m         General Appearance:    Looks criticall ill  Head:    Normocephalic, without obvious abnormality, atraumatic  Eyes:    PERRL - yes, conjunctiva/corneas - clear      Ears:    Normal external ear canals, both ears  Nose:   NG tube - no  Throat:  ETT TUBE - yes , OG tube - no  Neck:   Supple,  No enlargement/tenderness/nodules     Lungs:     Clear to auscultation bilaterally,  Ventilator Sync - yes  Chest wall:    No deformity  Heart:    S1 and S2 normal, no murmur, CVP - no.  Pressors - no  Abdomen:     Soft, no masses, no organomegaly  Genitalia:    Not done  Rectal:   not done  Extremities:   Extremities- intac     Skin:   Intact in exposed areas .     Neurologic:   Sedation - fent, versed, nimbex -> RASS - -4/BIS 30s              Vent Mode: PRVC FiO2 (%):  [40 %] 40 % Set Rate:  [20 bmp] 20 bmp Vt Set:  [350 mL] 350 mL PEEP:  [10 cmH20] 10 cmH20 Plateau Pressure:  [22 cmH20-25 cmH20] 23 cmH20INTAKE / OUTPUT: I/O last 3 completed shifts: In: 9110 [I.V.:2305.1; Other:160; NG/GT:2175.8; IV Piggyback:4469.1] Out: 10300 [Urine:10300]         PULMONARY Recent Labs  Lab 10/19/17 1211 10/19/17 1506 10/19/17 1655 10/20/17 0324 10/20/17 0416  PHART 7.457* 7.377 7.471* 7.530* 7.442  PCO2ART 68.6* 78.1* 59.6* 53.2* 66.4*  PO2ART 147.0* 105.0 78.0* 127.0* 120.0*  HCO3 47.8* 45.1* 43.4* 44.5* 45.4*  TCO2 50* 47* 45* 46* 47*  O2SAT 99.0 97.0 96.0 99.0 99.0    CBC Recent Labs  Lab 10/19/17 0504 10/20/17 0437 10/21/17 0417  HGB 8.1* 9.5* 9.4*  HCT 26.4* 30.6* 32.4*  WBC 11.5* 15.9* 12.8*  PLT 344 477* 491*    COAGULATION No results for input(s): INR in the last 168 hours.  CARDIAC  No results for input(s): TROPONINI in the last 168 hours. No results for input(s): PROBNP in the last 168 hours.   CHEMISTRY Recent Labs  Lab 10/17/17 0347 10/18/17 0455 10/19/17 0011 10/19/17 0504 10/19/17 0903 10/20/17 0000 10/20/17 0437 10/20/17 1800 10/20/17 2348 10/21/17 0417  NA 140 141 142  --  142 140  --  140 141  --   K 3.0* 3.1* 3.5  --  3.5 3.8  --  3.7 4.4  --   CL 103 100 93*  --  96* 88*  --  99 99  --   CO2 28 31 39*  --  39* 40*  --  34* 33*  --   GLUCOSE 95 104* 125*  --  112* 123*  --  113* 118*  --   BUN _1 --  12 18  --  24* 30*  --   CREATININE 0.68 0.53 0.61  --  0.43* 0.56  --  0.54 0.53  --   CALCIUM  8.3* 7.7* 8.0*  --  7.8* 8.4*  --  8.0* 8.9  --   MG 1.9 2.0  --  1.7  --   --  2.3  --   --  2.2  PHOS 3.5 3.6  --  3.8  --   --  5.1*  --   --  4.3   Estimated Creatinine Clearance: 108.8 mL/min (by C-G formula based on SCr of 0.53 mg/dL).   LIVER Recent Labs  Lab 10/18/17 0455  AST 20  ALT 13  ALKPHOS 47  BILITOT 0.5  PROT 5.4*  ALBUMIN 1.6*     INFECTIOUS Recent Labs  Lab 10/18/17 0455 10/19/17 0903 10/20/17 0437 10/21/17 0417  LATICACIDVEN 0.9  --   --   --   PROCALCITON  --  0.44 0.45 0.34     ENDOCRINE CBG (last 3)  Recent Labs    10/20/17 2326 10/21/17 0415 10/21/17 0814  GLUCAP 107* 132* 119*         IMAGING x48h  - image(s) personally visualized  -   highlighted in bold Dg Chest Port 1 View  Result Date: 10/20/2017 CLINICAL DATA:  Shortness of breath.  ETT. EXAM: PORTABLE CHEST 1 VIEW COMPARISON:  October 19, 2017 FINDINGS: The ETT is in good position. The right PICC line has been pulled back and terminates in the central SVC. No pneumothorax. Diffuse bilateral pulmonary opacities persist but have improved. No other interval changes. The NG tube terminates in the stomach. IMPRESSION: 1. Support apparatus as above. 2. Persistent but improving diffuse bilateral pulmonary opacities. Electronically Signed   By: Dorise Bullion III M.D   On: 10/20/2017 07:04    Principal Problem:   Multifocal pneumonia Active Problems:   Hypokalemia   Sepsis ([place])   Lobar pneumonia ([place])   Acute respiratory failure with hypoxia ([place])   ARDS (adult respiratory distress syndrome) ([place])     ASSESSMENT / PLAN:  PULMONARY/INFX   A: ARDS 10/16/17 with acute hypopxemic resp failure   - 10/21/2017 -  S.p nimbex x 48h. Down to 40% fio2/ppe 10. Pulse ox is 98%. CXR pending   Plan - ARDs ptotocol to continue - dc nimbex - lasix to continue   CARDIOVASCULAR  A - 10/21/2017 - nil acute s/p PICC 10/17/17 . ECHO ef 55% with some septal hypokinesis  P Diurese at 40  tid Depending on course cards consult - for now no indication  RENAL  A - normal renal function but clinically volume overloaded 10/21/2017.  - improved Volume overload - down to +14L . Has developed alkalosis and on diamox  P Diuresis to continue Continue diamox   HEME Recent Labs  Lab 10/19/17 0504 10/20/17 0437 10/21/17 0417  HGB 8.1* 9.5* 9.4*  HCT 26.4* 30.6* 32.4*  WBC 11.5* 15.9* 12.8*  PLT 344 477* 491*    A anemia of critical illness  P lovenox for dvt proph + - PRBC for hgb </= 6.9gm%    - exceptions are   -  if ACS susepcted/confirmed then transfuse for hgb </= 8.0gm%,  or    -  active bleeding with hemodynamic instability, then transfuse regardless of hemoglobin value   At at all times try to transfuse 1 unit prbc as possible with exception of active hemorrhage    GI  A: On TF  P PPI TF  ID Recent Labs  Lab 10/17/17 0347 10/18/17 0455 10/19/17 0504 10/20/17 0437 10/21/17 0417  WBC 14.6* 11.5* 11.5* 15.9* 12.8*   Recent Labs  Lab 10/19/17 0903 10/20/17 0437 10/21/17 0417  PROCALCITON 0.44 0.45 0.34     A Presented with infectious prodrome.    - BAL 8/11 -  49% PMN, 26% lymphs, 25% Macro. Autoimmune and vasculitis negative 10/17/17. No growth as of 10/21/2017  - repeat Trach aspriate 10/19/17 - due to VAP concern. No growth as of 10/21/2017   on 10/21/2017- fever resolved   P Await cultures But DC vanc Continue cefepime total 8 days (first drug that resovled fever) Anti-infectives (From admission, onward)   Start     Dose/Rate Route Frequency Ordered Stop   10/19/17 1400  ceFEPIme (MAXIPIME) 2 g in sodium chloride 0.9 % 100 mL IVPB     2 g 200 mL/hr over 30 Minutes Intravenous Every 8 hours 10/19/17 1257     10/17/17 2330  vancomycin (VANCOCIN) 1,500 mg in sodium chloride 0.9 % 500 mL IVPB     1,500 mg 250 mL/hr over 120 Minutes Intravenous Every 8 hours 10/17/17 1553     10/17/17 1415  vancomycin (VANCOCIN) 500 mg in sodium  chloride 0.9 % 100 mL IVPB     500 mg 100 mL/hr over 60 Minutes Intravenous NOW 10/17/17 1414 10/17/17 1620   10/16/17 1330  vancomycin (VANCOCIN) IVPB 1000 mg/200 mL premix  Status:  Discontinued     1,000 mg 200 mL/hr over 60 Minutes Intravenous Every 8 hours 10/16/17 1253 10/17/17 1553   10/16/17 0000  vancomycin (VANCOCIN) IVPB 1000 mg/200 mL premix  Status:  Discontinued     1,000 mg 200 mL/hr over 60 Minutes Intravenous Every 12 hours 10/15/17 1132 10/15/17 1426   10/16/17 0000  vancomycin (VANCOCIN) IVPB 1000 mg/200 mL premix  Status:  Discontinued     1,000 mg 200 mL/hr over 60 Minutes Intravenous Every 12 hours 10/15/17 1451 10/16/17 1253   10/15/17 1200  vancomycin (VANCOCIN) 1,500 mg in sodium chloride 0.9 % 500 mL IVPB     1,500 mg 250 mL/hr over 120 Minutes Intravenous  Once 10/15/17 1132 10/16/17 1320   10/15/17 1000  levofloxacin (LEVAQUIN) IVPB 750 mg  Status:  Discontinued     750 mg 100 mL/hr over 90 Minutes Intravenous Every 24 hours 10/15/17 0954 10/15/17 1001   10/14/17 1530  Ampicillin-Sulbactam (UNASYN) 3 g in sodium chloride 0.9 % 100 mL IVPB  Status:  Discontinued     3 g 200 mL/hr over 30 Minutes Intravenous Every 6 hours 10/14/17 1525 10/19/17 1258   10/13/17 2000  levofloxacin (LEVAQUIN) IVPB 750 mg  Status:  Discontinued     750 mg 100 mL/hr over 90 Minutes Intravenous  Once 10/13/17 0257 10/13/17 0752   10/13/17 2000  levofloxacin (LEVAQUIN) IVPB 750 mg  Status:  Discontinued     750 mg 100 mL/hr over 90 Minutes Intravenous Every 24 hours 10/13/17 0752 10/13/17 0817   10/13/17 2000  azithromycin (ZITHROMAX) 500 mg in sodium chloride 0.9 % 250 mL IVPB  Status:  Discontinued     500 mg 250 mL/hr over 60 Minutes Intravenous Every 24 hours 10/13/17 0817 10/16/17 1252   10/13/17 1300  cefTRIAXone (ROCEPHIN) 1 g in sodium chloride 0.9 % 100 mL IVPB  Status:  Discontinued     1 g 200 mL/hr over 30 Minutes Intravenous Every 24 hours 10/13/17 1258 10/14/17 1520    10/13/17 1000  cefTRIAXone (ROCEPHIN) 1 g in sodium chloride 0.9 % 100 mL IVPB  Status:  Discontinued     1 g 200 mL/hr over 30 Minutes Intravenous Every 24 hours 10/13/17 0817 10/13/17 1259   10/12/17 2030  levofloxacin (LEVAQUIN) IVPB 750 mg     750 mg 100 mL/hr over 90 Minutes Intravenous  Once 10/12/17 2017 10/12/17 2252        ENDO A No hx of DM  P ICU hyperglycemia protocol  CNS  A: in need of deep sedation due to aRDS.    - 88/15/19 - BIS 30s on fent, versed,   P -DC Nimbex  - fent gtt - versed gtt - add precedex - after nimbex off, change to RASS goal 0 to -3 but ensure vent synchronty      FAMILY  Mom not at bedside 10/21/2017. Boyfriend not at bedside 10/21/2017     The patient is critically ill with multiple organ systems failure and requires high complexity decision making for assessment and support, frequent evaluation and titration of therapies, application of advanced monitoring technologies and extensive interpretation of multiple databases.   Critical Care Time devoted to patient care services described in this note is  30  Minutes. This time reflects time of care of this signee Dr Brand Males. This critical care time does not reflect procedure time, or teaching time or supervisory time of PA/NP/Med student/Med Resident etc but could involve care discussion time    Dr. Brand Males, M.D., [facility].C.P Pulmonary and Critical Care Medicine Staff Physician [place] [facility] Pager: [phone], If no answer or between  15:00h - 7:00h: call [phone]  10/21/2017 9:49 AM

## 2017-10-21 NOTE — Progress Notes (Signed)
Pharmacy Progress Note:  Pt became agitated upon stopping neuromuscular blocker. Discussed case with MD. Patient is likely tachyphylactic to IV fentanyl. Team is agreement to switch patient to IV hydromorphone and stopping IV fentanyl.   Vinnie LevelBenjamin Tzirel Leonor, PharmD., BCPS Clinical Pharmacist Clinical phone for 10/21/17 until 3:30pm: [phone] If after 3:30pm, [facility] for unit-specific pharmacist

## 2017-10-21 NOTE — Progress Notes (Signed)
   Not handling nimbex dc And vent dsynch and agiated with fent, percedex and versed  Pl;an Roc x 1 x 70mg  Dc precedex Start diprivan gtt   Dr. Kalman ShanMurali Woodward Klem, M.D., [facility].C.P Pulmonary and Critical Care Medicine Staff Physician, [facility] Director - Interstitial Lung Disease  Program  Pulmonary Fibrosis [facility] at [facility] [place], [place], [id number]  Pager: [phone], If no answer or between  15:00h - 7:00h: call [phone] Telephone: [phone]

## 2017-10-21 NOTE — Progress Notes (Signed)
  Finaly family at bedside.  Explained idiopathich ARDS and asked if she VAPES -> mom and esp boy friend said yes - > has been VAPING for few months up until admission  A - VAPE related ARDS  p ARDS care Resident to call info into [place] hot line  Dr. Kalman ShanMurali Lyllian Gause, M.D., [facility].C.P Pulmonary and Critical Care Medicine Staff Physician, [facility] Director - Interstitial Lung Disease  Program  Pulmonary Fibrosis [facility] at [facility] [place], [place], [id number]  Pager: [phone], If no answer or between  15:00h - 7:00h: call [phone] Telephone: [phone]

## 2017-10-22 ENCOUNTER — Inpatient Hospital Stay (HOSPITAL_COMMUNITY): Payer: Self-pay

## 2017-10-22 DIAGNOSIS — Z779 Other contact with and (suspected) exposures hazardous to health: Secondary | ICD-10-CM

## 2017-10-22 LAB — CBC WITH DIFFERENTIAL/PLATELET
BASOS PCT: 1 %
Basophils Absolute: 0.1 10*3/uL (ref 0.0–0.1)
EOS ABS: 0.5 10*3/uL (ref 0.0–0.7)
Eosinophils Relative: 4 %
HCT: 32.8 % — ABNORMAL LOW (ref 36.0–46.0)
Hemoglobin: 9.8 g/dL — ABNORMAL LOW (ref 12.0–15.0)
LYMPHS ABS: 2.1 10*3/uL (ref 0.7–4.0)
Lymphocytes Relative: 19 %
MCH: 27.1 pg (ref 26.0–34.0)
MCHC: 29.9 g/dL — ABNORMAL LOW (ref 30.0–36.0)
MCV: 90.9 fL (ref 78.0–100.0)
Monocytes Absolute: 0.5 10*3/uL (ref 0.1–1.0)
Monocytes Relative: 4 %
Neutro Abs: 8.1 10*3/uL — ABNORMAL HIGH (ref 1.7–7.7)
Neutrophils Relative %: 72 %
Platelets: 562 10*3/uL — ABNORMAL HIGH (ref 150–400)
RBC: 3.61 MIL/uL — ABNORMAL LOW (ref 3.87–5.11)
RDW: 13.9 % (ref 11.5–15.5)
WBC: 11.3 10*3/uL — ABNORMAL HIGH (ref 4.0–10.5)

## 2017-10-22 LAB — POCT I-STAT 3, ART BLOOD GAS (G3+)
Acid-Base Excess: 8 mmol/L — ABNORMAL HIGH (ref 0.0–2.0)
BICARBONATE: 33.9 mmol/L — AB (ref 20.0–28.0)
O2 SAT: 98 %
PCO2 ART: 55.6 mmHg — AB (ref 32.0–48.0)
TCO2: 36 mmol/L — AB (ref 22–32)
pH, Arterial: 7.393 (ref 7.350–7.450)
pO2, Arterial: 117 mmHg — ABNORMAL HIGH (ref 83.0–108.0)

## 2017-10-22 LAB — QUANTIFERON-TB GOLD PLUS (RQFGPL)
QUANTIFERON MITOGEN VALUE: 0.11 [IU]/mL
QUANTIFERON NIL VALUE: 0.08 [IU]/mL
QUANTIFERON TB1 AG VALUE: 0.08 [IU]/mL
QuantiFERON TB2 Ag Value: 0.07 IU/mL

## 2017-10-22 LAB — CULTURE, BLOOD (ROUTINE X 2)
Culture: NO GROWTH
Culture: NO GROWTH
SPECIAL REQUESTS: ADEQUATE
Special Requests: ADEQUATE

## 2017-10-22 LAB — GLUCOSE, CAPILLARY
GLUCOSE-CAPILLARY: 92 mg/dL (ref 70–99)
GLUCOSE-CAPILLARY: 101 mg/dL — AB (ref 70–99)
GLUCOSE-CAPILLARY: 88 mg/dL (ref 70–99)
Glucose-Capillary: 112 mg/dL — ABNORMAL HIGH (ref 70–99)
Glucose-Capillary: 99 mg/dL (ref 70–99)
Glucose-Capillary: 103 mg/dL — ABNORMAL HIGH (ref 70–99)

## 2017-10-22 LAB — QUANTIFERON-TB GOLD PLUS: QUANTIFERON-TB GOLD PLUS: UNDETERMINED

## 2017-10-22 LAB — BASIC METABOLIC PANEL
Anion gap: 12 (ref 5–15)
BUN: 33 mg/dL — AB (ref 6–20)
CALCIUM: 8.6 mg/dL — AB (ref 8.9–10.3)
CO2: 31 mmol/L (ref 22–32)
CREATININE: 0.7 mg/dL (ref 0.44–1.00)
Chloride: 100 mmol/L (ref 98–111)
GFR calc Af Amer: >60 mL/min (ref >60)
GLUCOSE: 130 mg/dL — AB (ref 70–99)
Potassium: 3.4 mmol/L — ABNORMAL LOW (ref 3.5–5.1)
Sodium: 143 mmol/L (ref 135–145)

## 2017-10-22 LAB — PHOSPHORUS: PHOSPHORUS: 2.5 mg/dL (ref 2.5–4.6)

## 2017-10-22 LAB — TRIGLYCERIDES: Triglycerides: 131 mg/dL (ref <150)

## 2017-10-22 LAB — MAGNESIUM: MAGNESIUM: 2.2 mg/dL (ref 1.7–2.4)

## 2017-10-22 LAB — SEDIMENTATION RATE: Sed Rate: 114 mm/hr — ABNORMAL HIGH (ref 0–22)

## 2017-10-22 MED ORDER — CLONAZEPAM 0.1 MG/ML ORAL SUSPENSION
0.5000 mg | Freq: Two times a day (BID) | ORAL | Status: DC
  Filled 2017-10-22: qty 5

## 2017-10-22 MED ORDER — MIDAZOLAM HCL 2 MG/2ML IJ SOLN
2.0000 mg | INTRAMUSCULAR | Status: AC | PRN
  Administered 2017-10-22 (×3): 2 mg via INTRAVENOUS

## 2017-10-22 MED ORDER — QUETIAPINE FUMARATE 25 MG PO TABS
25.0000 mg | ORAL_TABLET | Freq: Two times a day (BID) | ORAL | Status: DC
  Administered 2017-10-22 (×2): 25 mg via ORAL
  Filled 2017-10-22 (×3): qty 1

## 2017-10-22 MED ORDER — HYDROMORPHONE HCL 1 MG/ML IJ SOLN
0.5000 mg | INTRAMUSCULAR | Status: DC | PRN
  Administered 2017-10-22: 2 mg via INTRAVENOUS
  Administered 2017-10-22 – 2017-10-23 (×2): 1 mg via INTRAVENOUS
  Filled 2017-10-22: qty 2
  Filled 2017-10-22: qty 1

## 2017-10-22 MED ORDER — SODIUM CHLORIDE 0.9 % IV SOLN
0.0000 mg/h | INTRAVENOUS | Status: DC
  Administered 2017-10-22: 5 mg/h via INTRAVENOUS
  Administered 2017-10-23: 6 mg/h via INTRAVENOUS
  Administered 2017-10-23: 5 mg/h via INTRAVENOUS
  Administered 2017-10-23: 6 mg/h via INTRAVENOUS
  Administered 2017-10-24: 5 mg/h via INTRAVENOUS
  Filled 2017-10-22 (×5): qty 10

## 2017-10-22 MED ORDER — MIDAZOLAM BOLUS VIA INFUSION
1.0000 mg | INTRAVENOUS | Status: DC | PRN
  Administered 2017-10-23 – 2017-10-24 (×3): 2 mg via INTRAVENOUS
  Filled 2017-10-22: qty 2

## 2017-10-22 MED ORDER — CLONAZEPAM 0.5 MG PO TBDP
0.5000 mg | ORAL_TABLET | Freq: Two times a day (BID) | ORAL | Status: DC
  Administered 2017-10-22 – 2017-10-24 (×5): 0.5 mg
  Filled 2017-10-22 (×5): qty 1

## 2017-10-22 MED ORDER — MIDAZOLAM HCL 2 MG/2ML IJ SOLN
1.0000 mg | Freq: Once | INTRAMUSCULAR | Status: AC | PRN
  Administered 2017-10-22: 4 mg via INTRAVENOUS

## 2017-10-22 MED ORDER — POTASSIUM CHLORIDE 20 MEQ/15ML (10%) PO SOLN
40.0000 meq | Freq: Once | ORAL | Status: AC
  Administered 2017-10-22: 40 meq

## 2017-10-22 MED ORDER — PROPOFOL 1000 MG/100ML IV EMUL
5.0000 ug/kg/min | INTRAVENOUS | Status: DC
  Administered 2017-10-22: 50 ug/kg/min via INTRAVENOUS
  Administered 2017-10-22 (×2): 80 ug/kg/min via INTRAVENOUS
  Administered 2017-10-22 – 2017-10-23 (×2): 75 ug/kg/min via INTRAVENOUS
  Administered 2017-10-23: 65 ug/kg/min via INTRAVENOUS
  Administered 2017-10-23: 75 ug/kg/min via INTRAVENOUS
  Administered 2017-10-23: 50 ug/kg/min via INTRAVENOUS
  Administered 2017-10-23: 69.991 ug/kg/min via INTRAVENOUS
  Administered 2017-10-23: 70 ug/kg/min via INTRAVENOUS
  Administered 2017-10-23: 75 ug/kg/min via INTRAVENOUS
  Administered 2017-10-23: 70 ug/kg/min via INTRAVENOUS
  Administered 2017-10-24: 60 ug/kg/min via INTRAVENOUS
  Administered 2017-10-24 (×2): 70 ug/kg/min via INTRAVENOUS
  Filled 2017-10-22 (×11): qty 100
  Filled 2017-10-22: qty 200
  Filled 2017-10-22 (×2): qty 100

## 2017-10-22 MED ORDER — MIDAZOLAM HCL 2 MG/2ML IJ SOLN: 2.0000 mg | INTRAMUSCULAR | Status: DC | PRN

## 2017-10-22 MED ORDER — POTASSIUM CHLORIDE 20 MEQ/15ML (10%) PO SOLN
40.0000 meq | Freq: Every day | ORAL | Status: DC
  Administered 2017-10-23 – 2017-10-24 (×2): 40 meq
  Filled 2017-10-22 (×3): qty 30

## 2017-10-22 MED ORDER — DEXMEDETOMIDINE HCL IN NACL 400 MCG/100ML IV SOLN
0.4000 ug/kg/h | INTRAVENOUS | Status: DC
  Administered 2017-10-22: 1 ug/kg/h via INTRAVENOUS
  Administered 2017-10-22 – 2017-10-23 (×5): 1.2 ug/kg/h via INTRAVENOUS
  Filled 2017-10-22 (×3): qty 100
  Filled 2017-10-22: qty 200
  Filled 2017-10-22: qty 100

## 2017-10-22 MED ORDER — FUROSEMIDE 10 MG/ML IJ SOLN
40.0000 mg | Freq: Every day | INTRAMUSCULAR | Status: DC
  Administered 2017-10-23: 40 mg via INTRAVENOUS
  Filled 2017-10-22: qty 4

## 2017-10-22 NOTE — Progress Notes (Signed)
Called to bedside by RN and RT.  Pt on SBT and WUA with goal extubation per MD notes.  Had been requiring heavy sedation with propofol, versed, dilaudid gtts.  Switched to precedex gtt this am with goal wean and extubate.    On my arrival pt is severely agitated, pulling at ETT, kicking and writhing in bed.  Does not track or follow any commands.  No response to my voice.  RR 60's with Vt 100-18450ml. Sats dropping.     PLAN -  Abort WUA/SBT  Propofol and versed gtts resumed  Continue precedex gtt for now  Will add enteral clonopin 0.5mg  BID and seroquel 25mg  BID  Retry WUA, SBT in am - goal would be to d/c propofol, versed gtts and continue precedex  F/u CXR  Continue OFF dilaudid gtt for now  F/u CXR in am   Stephanie DressKaty Azzan Butler, NP 10/22/2017  12:13 PM Pager: [phone] or [phone]

## 2017-10-22 NOTE — Progress Notes (Signed)
Pharmacy Antibiotic Note  Stephanie SchneidersJuliana Young is a 27 y.o. female admitted on 10/12/2017 with multifocal pneumonia/ARDS and possible VAP.  Pharmacy has been consulted for Cefepime dosing. Likely to d/c 8/19 or 8/20 per CCM note. SCr stable.  Plan: - Continue Cefepime 2g Q8H - Follow renal function, labs, cultures, LOT  Height: 5\' 5"  (165.1 cm) Weight: 167 lb 15.9 oz (76.2 kg) IBW/kg (Calculated) : 57  Temp (24hrs), Avg:99.7 F (37.6 C), Min:98.3 F (36.8 C), Max:101.4 F (38.6 C)  Recent Labs  Lab 10/17/17 1244 10/18/17 0455  10/19/17 0504 10/19/17 0644  10/20/17 0000 10/20/17 0437 10/20/17 1800 10/20/17 2348 10/21/17 0417 10/21/17 0938 10/22/17 0000 10/22/17 0312  WBC  --  11.5*  --  11.5*  --   --   --  15.9*  --   --  12.8*  --   --  11.3*  CREATININE  --  0.53   < >  --   --    < > 0.56  --  0.54 0.53  --  0.57 0.70  --   LATICACIDVEN  --  0.9  --   --   --   --   --   --   --   --   --   --   --   --   VANCOTROUGH 9*  --   --   --  15  --   --   --   --   --   --   --   --   --    < > = values in this interval not displayed.    Estimated Creatinine Clearance: 108.8 mL/min (by C-G formula based on SCr of 0.7 mg/dL).    No Known Allergies  Babs BertinHaley Avalee Castrellon, PharmD, BCPS Clinical Pharmacist Clinical phone [phone] Please check AMION for all [facility] contact numbers 10/22/2017 1:35 PM

## 2017-10-22 NOTE — Progress Notes (Signed)
PULMONARY / CRITICAL CARE MEDICINE   Name: Stephanie Young MRN: [id number] DOB: 05/13/1990    ADMISSION DATE:  10/12/2017 CONSULTATION DATE:  10/16/17  LOS 8 days  REFERRING MD: Dr. Wynelle Cleveland  CHIEF COMPLAINT:  Worsening dyspnea  Brief : Stephanie Young is a 27 y.o. female without significant past medical history, who presents with a cough, shortness of breath and chest pain.  Patient states that she has been having cough, shortness of breath and chest pain for almost 4 days. She coughs up clear mucus. Her chest pain is located in the frontal chest, sharp, mild, nonradiating, pleuritic, aggravated by deep breath and coughing. Patient has a subjective fever and chills. She states she has nausea and vomited several times, which has resolved. Currently patient does not have nausea, vomiting, diarrhea or abdominal pain. No symptoms of UTI. No unilateral weakness.  ED Course: pt was found to have  WBC 16.8, lactic acid 0.84, negative pregnancy test, potassium 3.3, creatinine normal, temperature 99.7, tachycardia, tachypnea, O2 sat are 96% on room air. CT angiogram is negative for PE, but showed multifocal pneumonia. Patient is placed on telemetry bed for observation.  8/11  I was asked to see the patient today for worsening respiratory distress.  She appears quite tired and has increased work of breathing. Has a temp today of 100.7 The patient has a Pa02 of 57 on a NRM. Her recent CT scan chest shows multifocal infitlrates Her urine for Strep and Legionella was negative. Her MRSA nasal spec was negative for MRSA. Her CXR appears much worse today than iot did on 8/7. WBC is 16K and Procal 0.56. The patient has no hitory of recreational drug use, recent travel. Had a negative HIV test.The patient is on no immunosuppressive therapy.  She does not smoke. Ha s most recently been on Azithromax, Unasyn and Vanco. The patient has been seen by ID. Gyn bateria - negative RVP negative    10/17/2017 -  474mg fent + 80 diprivan + 0.519mversed. -> arousable but then desatus easy. Follows commands. On vent. S/p bronch yesterday -> 49% polys, 26% lymphs, 40% fio2, peep 10, -Significant vent dysnchroncy yesterday is improved. Autoimmune. HP and vasculitis profile - negative   [place] RN [place]   10/18/17 - fentanyl, diprivan and versed gtt /. On Unasynm and Vanc. Fever curve trending down. CXR and pulse ox better after lasx x 1 yesterday . +17.6L since admit   8/14 - diuresed and now +16+ L but cxr worse, hyppoxemia worse with fio2 at 100%. On versed 1075mnd fent 400m19m RASS -3. - opens eyes when stimulated per RN. BIs 95 currently. Abx changed - repeat culture trach, increase lasix, start nimbex   8/15 - fever improved vnadir before respike. Down to +14L with high dose lasix. Now on diamox for contraction alk. CXR vastly better. Not on pressors. On fent gtt, versed, and diprivan -> BIS 35. On nimbex. -. TOF 0/4  On fio2 50%/peep 10 -> pulse ox 95%   8/16 - 40% fio2/peep 10, On fent , versed gtt. Nimbex holiday during day but restarted overnight due to vent dysnchrony. Not on pressors. BIS in 30s. Fluid balance +14L  After lasix dropped. Fever - resolved after abx unasun chagned to cefepime. No growtih from BAL 8/11 and trach aspirate 10/19/17    SUBJECTIVE/OVERNIGHT/INTERVAL HX 8/17 - Hx of VAPE use obtained  from mom and BF yesterday. Dx is VAPE ARDS - reported to poison control. Rec: supporiotve care +/- steroids. Currently off nimbex. On fent, versed, diprivan -> WUA results in emotional address but following commands. CXR clear                               FEbrile again last night to 101F. Culture negatgive so far   VITAL SIGNS: BP 130/72   Pulse (!) 123   Temp 98.3 F (36.8 C) (Oral)   Resp 15   Ht _0  (1.651 m)   Wt 76.2 kg   SpO2 99%   BMI 27.96 kg/m          General Appearance:    Looks better but still on vent  Head:    Normocephalic, without obvious abnormality, atraumatic  Eyes:    PERRL - yes, conjunctiva/corneas - clear      Ears:    Normal external ear canals, both ears  Nose:   NG tube - no  Throat:  ETT TUBE - yes , OG tube - yes wth TF on hold  Neck:   Supple,  No enlargement/tenderness/nodules     Lungs:     Clear to auscultation bilaterally, Ventilator   Synchrony - yes except when in distress  Chest wall:    No deformity  Heart:    S1 and S2 normal, no murmur, CVP - no.  Pressors - no  Abdomen:     Soft, no masses, no organomegaly  Genitalia:    Not done  Rectal:   not done  Extremities:   Extremities- intact     Skin:   Intact in exposed areas . Sacral area - intact per rN     Neurologic:   Sedation - diprivan gtt alone (dilaudid and versd off x 1h) -> RASS - +2 . Moves all 4s - yes. CAM-ICU - neg . Orientation - follows commands but restless and crying and emotional and looks sad                 Vent Mode: PRVC FiO2 (%):  [40 %] 40 % Set Rate:  [20 bmp] 20 bmp Vt Set:  [350 mL] 350 mL PEEP:  [5 cmH20-8 cmH20] 5 cmH20 Plateau Pressure:  [14 cmH20-18 cmH20] 16 cmH20INTAKE / OUTPUT: I/O last 3 completed shifts: In: 8354.3 [I.V.:2089.4; Other:100; NG/GT:2695.8; IV Piggyback:3469.1] Out: 8080 [Urine:8080]         PULMONARY Recent Labs  Lab 10/19/17 1506 10/19/17 1655 10/20/17 0324 10/20/17 0416 10/22/17 0343  PHART 7.377 7.471* 7.530* 7.442 7.393  PCO2ART 78.1* 59.6* 53.2* 66.4* 55.6*  PO2ART 105.0 78.0* 127.0* 120.0* 117.0*  HCO3 45.1* 43.4* 44.5* 45.4* 33.9*  TCO2 47* 45* 46* 47* 36*  O2SAT 97.0 96.0 99.0 99.0 98.0    CBC Recent Labs  Lab 10/20/17 0437 10/21/17 0417 10/22/17 0312  HGB 9.5* 9.4* 9.8*  HCT 30.6* 32.4* 32.8*  WBC 15.9* 12.8* 11.3*  PLT 477* 491* 562*    COAGULATION No results for input(s): INR in the last 168 hours.  CARDIAC  No results for input(s):  TROPONINI in the last 168 hours. No results for input(s): PROBNP in the last 168 hours.   CHEMISTRY Recent Labs  Lab 10/18/17 0455  10/19/17 0504  10/20/17 0000 10/20/17 0437 10/20/17 1800 10/20/17 2348 10/21/17 0417 10/21/17 0938 10/22/17 0000 10/22/17 0312  NA 141   < >  --    < >  140  --  140 141  --  140 143  --   K 3.1*   < >  --    < > 3.8  --  3.7 4.4  --  3.6 3.4*  --   CL 100   < >  --    < > 88*  --  99 99  --  104 100  --   CO2 31   < >  --    < > 40*  --  34* 33*  --  29 31  --   GLUCOSE 104*   < >  --    < > 123*  --  113* 118*  --  118* 130*  --   BUN 8   < >  --    < > 18  --  24* 30*  --  28* 33*  --   CREATININE 0.53   < >  --    < > 0.56  --  0.54 0.53  --  0.57 0.70  --   CALCIUM 7.7*   < >  --    < > 8.4*  --  8.0* 8.9  --  8.4* 8.6*  --   MG 2.0  --  1.7  --   --  2.3  --   --  2.2  --   --  2.2  PHOS 3.6  --  3.8  --   --  5.1*  --   --  4.3  --   --  2.5   < > = values in this interval not displayed.   Estimated Creatinine Clearance: 108.8 mL/min (by C-G formula based on SCr of 0.7 mg/dL).   LIVER Recent Labs  Lab 10/18/17 0455  AST 20  ALT 13  ALKPHOS 47  BILITOT 0.5  PROT 5.4*  ALBUMIN 1.6*     INFECTIOUS Recent Labs  Lab 10/18/17 0455 10/19/17 0903 10/20/17 0437 10/21/17 0417  LATICACIDVEN 0.9  --   --   --   PROCALCITON  --  0.44 0.45 0.34     ENDOCRINE CBG (last 3)  Recent Labs    10/21/17 2342 10/22/17 0355 10/22/17 0754  GLUCAP 102* 112* 99         IMAGING x48h  - image(s) personally visualized  -   highlighted in bold Dg Chest Port 1 View  Result Date: 10/22/2017 CLINICAL DATA:  Endotracheal tube placement. EXAM: PORTABLE CHEST 1 VIEW COMPARISON:  10/21/2017 and 10/12/2017 FINDINGS: Nasogastric tube is coiled once over the stomach with tip and side-port within the stomach in the left upper quadrant. Endotracheal tube has tip 3.1 cm above the carina. Right-sided PICC line unchanged with tip just below the  cavoatrial junction. Lungs are adequately inflated with persistent hazy patchy interstitial prominence over the mid to lower lungs unchanged. No lobar consolidation or effusion. Cardiomediastinal silhouette and remainder the exam is unchanged. IMPRESSION: Stable hazy interstitial prominence over the mid to lower lungs. Tubes and lines as described. Electronically Signed   By: Marin Olp M.D.   On: 10/22/2017 08:04   Dg Chest Port 1 View  Result Date: 10/21/2017 CLINICAL DATA:  ET tube EXAM: PORTABLE CHEST 1 VIEW COMPARISON:  10/20/2017 FINDINGS: Support devices are stable. Endotracheal tube is 4 cm above the carina. Heart is normal size. Mild interstitial prominence throughout the lungs, with further improvement since prior study. No effusions or acute bony abnormality. IMPRESSION: Continued interstitial prominence throughout the lungs, slightly improved since  prior study. Electronically Signed   By: Rolm Baptise M.D.   On: 10/21/2017 10:15    Principal Problem:   Multifocal pneumonia Active Problems:   Hypokalemia   Sepsis ([place])   Lobar pneumonia ([place])   Acute respiratory failure with hypoxia (H[facility])   ARDS (adult respiratory distress syndrome) (H[facility])     ASSESSMENT / PLAN:  PULMONARY/INFX   A: ARDS 10/16/17 with acute hypopxemic resp failure . Hx of VAPE use obtained on 8;16/9   - 10/22/2017 -  S.p nimbex x 48h ending 10/21/17 . Meets SBT criteria . Dx is VAPE ARDS - poison control infrmed yesterday  Plan SBT and if does well extubate - might need precedex No VAPE counseling needed  CARDIOVASCULAR  A - 10/22/2017 - nil acute s/p PICC 10/17/17 . ECHO ef 55% with some septal hypokinesis. Seems well diuresed  P Dc lasix and kcl Depending on course cards consult - for now no indication  RENAL  A - normal renal function but volume overload +17L on8/13. REduced to +12L 10/22/2017  P Diuresis to continue but drop to once daily Continue diamox   HEME Recent Labs  Lab  10/20/17 0437 10/21/17 0417 10/22/17 0312  HGB 9.5* 9.4* 9.8*  HCT 30.6* 32.4* 32.8*  WBC 15.9* 12.8* 11.3*  PLT 477* 491* 562*    A anemia of critical illness  P lovenox for dvt proph + - PRBC for hgb </= 6.9gm%    - exceptions are   -  if ACS susepcted/confirmed then transfuse for hgb </= 8.0gm%,  or    -  active bleeding with hemodynamic instability, then transfuse regardless of hemoglobin value   At at all times try to transfuse 1 unit prbc as possible with exception of active hemorrhage    GI  A: On TF  P PPI TF - hold for possible extubation  ID Recent Labs  Lab 10/18/17 0455 10/19/17 0504 10/20/17 0437 10/21/17 0417 10/22/17 0312  WBC 11.5* 11.5* 15.9* 12.8* 11.3*   Recent Labs  Lab 10/19/17 0903 10/20/17 0437 10/21/17 0417  PROCALCITON 0.44 0.45 0.34     A Presented with infectious prodrome.    - BAL 8/11 - 49% PMN, 26% lymphs, 25% Macro. Autoimmune and vasculitis negative 10/17/17. No growth as of 10/22/2017  - repeat Trach aspriate 10/19/17 - due to VAP concern. No growth as of 10/22/2017   on 10/22/2017- fever resolved   P Await cultures But DC vanc Continue cefepime total 8 days (first drug that resovled fever) - through 8/19 or 8/20 Anti-infectives (From admission, onward)   Start     Dose/Rate Route Frequency Ordered Stop   10/19/17 1400  ceFEPIme (MAXIPIME) 2 g in sodium chloride 0.9 % 100 mL IVPB     2 g 200 mL/hr over 30 Minutes Intravenous Every 8 hours 10/19/17 1257     10/17/17 2330  vancomycin (VANCOCIN) 1,500 mg in sodium chloride 0.9 % 500 mL IVPB  Status:  Discontinued     1,500 mg 250 mL/hr over 120 Minutes Intravenous Every 8 hours 10/17/17 1553 10/21/17 0944   10/17/17 1415  vancomycin (VANCOCIN) 500 mg in sodium chloride 0.9 % 100 mL IVPB     500 mg 100 mL/hr over 60 Minutes Intravenous NOW 10/17/17 1414 10/17/17 1620   10/16/17 1330  vancomycin (VANCOCIN) IVPB 1000 mg/200 mL premix  Status:  Discontinued     1,000  mg 200 mL/hr over 60 Minutes Intravenous Every 8 hours 10/16/17 1253 10/17/17 1553  10/16/17 0000  vancomycin (VANCOCIN) IVPB 1000 mg/200 mL premix  Status:  Discontinued     1,000 mg 200 mL/hr over 60 Minutes Intravenous Every 12 hours 10/15/17 1132 10/15/17 1426   10/16/17 0000  vancomycin (VANCOCIN) IVPB 1000 mg/200 mL premix  Status:  Discontinued     1,000 mg 200 mL/hr over 60 Minutes Intravenous Every 12 hours 10/15/17 1451 10/16/17 1253   10/15/17 1200  vancomycin (VANCOCIN) 1,500 mg in sodium chloride 0.9 % 500 mL IVPB     1,500 mg 250 mL/hr over 120 Minutes Intravenous  Once 10/15/17 1132 10/16/17 1320   10/15/17 1000  levofloxacin (LEVAQUIN) IVPB 750 mg  Status:  Discontinued     750 mg 100 mL/hr over 90 Minutes Intravenous Every 24 hours 10/15/17 0954 10/15/17 1001   10/14/17 1530  Ampicillin-Sulbactam (UNASYN) 3 g in sodium chloride 0.9 % 100 mL IVPB  Status:  Discontinued     3 g 200 mL/hr over 30 Minutes Intravenous Every 6 hours 10/14/17 1525 10/19/17 1258   10/13/17 2000  levofloxacin (LEVAQUIN) IVPB 750 mg  Status:  Discontinued     750 mg 100 mL/hr over 90 Minutes Intravenous  Once 10/13/17 0257 10/13/17 0752   10/13/17 2000  levofloxacin (LEVAQUIN) IVPB 750 mg  Status:  Discontinued     750 mg 100 mL/hr over 90 Minutes Intravenous Every 24 hours 10/13/17 0752 10/13/17 0817   10/13/17 2000  azithromycin (ZITHROMAX) 500 mg in sodium chloride 0.9 % 250 mL IVPB  Status:  Discontinued     500 mg 250 mL/hr over 60 Minutes Intravenous Every 24 hours 10/13/17 0817 10/16/17 1252   10/13/17 1300  cefTRIAXone (ROCEPHIN) 1 g in sodium chloride 0.9 % 100 mL IVPB  Status:  Discontinued     1 g 200 mL/hr over 30 Minutes Intravenous Every 24 hours 10/13/17 1258 10/14/17 1520   10/13/17 1000  cefTRIAXone (ROCEPHIN) 1 g in sodium chloride 0.9 % 100 mL IVPB  Status:  Discontinued     1 g 200 mL/hr over 30 Minutes Intravenous Every 24 hours 10/13/17 0817 10/13/17 1259   10/12/17 2030   levofloxacin (LEVAQUIN) IVPB 750 mg     750 mg 100 mL/hr over 90 Minutes Intravenous  Once 10/12/17 2017 10/12/17 2252        ENDO A No hx of DM  P ICU hyperglycemia protocol  CNS  A: in need of deep sedation due to aRDS. Ended 10/21/17   - 8/17 - emotional agition on wua. Needed rotation to diluadid 1-2 day ago  P Dc dilaudid gtt Dc versed gtt Dc diprivan gtt PRecedex gtt and extubate under precedex      FAMILY  Mom and Boyfriend updated 10/21/17 - informed them about VAPE ARDS. Not at bedside 8/17/.19      The patient is critically ill with multiple organ systems failure and requires high complexity decision making for assessment and support, frequent evaluation and titration of therapies, application of advanced monitoring technologies and extensive interpretation of multiple databases.   Critical Care Time devoted to patient care services described in this note is  30  Minutes. This time reflects time of care of this signee Dr Brand Males. This critical care time does not reflect procedure time, or teaching time or supervisory time of PA/NP/Med student/Med Resident etc but could involve care discussion time    Dr. Brand Males, M.D., [facility].C.P Pulmonary and Critical Care Medicine Staff Physician [facility] Pager:  [phone], If no answer or between  15:00h - 7:00h: call [phone]  10/22/2017 11:16 AM

## 2017-10-23 ENCOUNTER — Inpatient Hospital Stay (HOSPITAL_COMMUNITY): Payer: Self-pay

## 2017-10-23 LAB — BASIC METABOLIC PANEL
Anion gap: 11 (ref 5–15)
BUN: 33 mg/dL — ABNORMAL HIGH (ref 6–20)
CALCIUM: 8.6 mg/dL — AB (ref 8.9–10.3)
CHLORIDE: 104 mmol/L (ref 98–111)
CO2: 29 mmol/L (ref 22–32)
CREATININE: 0.49 mg/dL (ref 0.44–1.00)
GFR calc Af Amer: >60 mL/min (ref >60)
GFR calc non Af Amer: >60 mL/min (ref >60)
GLUCOSE: 102 mg/dL — AB (ref 70–99)
Potassium: 3.9 mmol/L (ref 3.5–5.1)
Sodium: 144 mmol/L (ref 135–145)

## 2017-10-23 LAB — CBC WITH DIFFERENTIAL/PLATELET
BASOS PCT: 0 %
Basophils Absolute: 0 10*3/uL (ref 0.0–0.1)
Eosinophils Absolute: 0.2 10*3/uL (ref 0.0–0.7)
Eosinophils Relative: 2 %
HCT: 31.3 % — ABNORMAL LOW (ref 36.0–46.0)
Hemoglobin: 9.6 g/dL — ABNORMAL LOW (ref 12.0–15.0)
Lymphocytes Relative: 25 %
Lymphs Abs: 3.1 10*3/uL (ref 0.7–4.0)
MCH: 27.2 pg (ref 26.0–34.0)
MCHC: 30.7 g/dL (ref 30.0–36.0)
MCV: 88.7 fL (ref 78.0–100.0)
MONO ABS: 0.5 10*3/uL (ref 0.1–1.0)
Monocytes Relative: 4 %
NEUTROS ABS: 8.6 10*3/uL — AB (ref 1.7–7.7)
NEUTROS PCT: 69 %
PLATELETS: 574 10*3/uL — AB (ref 150–400)
RBC: 3.53 MIL/uL — ABNORMAL LOW (ref 3.87–5.11)
RDW: 13.9 % (ref 11.5–15.5)
WBC: 12.4 10*3/uL — ABNORMAL HIGH (ref 4.0–10.5)

## 2017-10-23 LAB — GLUCOSE, CAPILLARY
GLUCOSE-CAPILLARY: 100 mg/dL — AB (ref 70–99)
GLUCOSE-CAPILLARY: 94 mg/dL (ref 70–99)
GLUCOSE-CAPILLARY: 94 mg/dL (ref 70–99)
Glucose-Capillary: 100 mg/dL — ABNORMAL HIGH (ref 70–99)
Glucose-Capillary: 102 mg/dL — ABNORMAL HIGH (ref 70–99)

## 2017-10-23 LAB — MAGNESIUM: Magnesium: 2.2 mg/dL (ref 1.7–2.4)

## 2017-10-23 LAB — PHOSPHORUS: Phosphorus: 3.4 mg/dL (ref 2.5–4.6)

## 2017-10-23 MED ORDER — HALOPERIDOL LACTATE 5 MG/ML IJ SOLN
5.0000 mg | Freq: Four times a day (QID) | INTRAMUSCULAR | Status: DC
  Administered 2017-10-23 – 2017-10-24 (×4): 5 mg via INTRAVENOUS
  Filled 2017-10-23 (×4): qty 1

## 2017-10-23 MED ORDER — SODIUM CHLORIDE 0.9 % IV SOLN
0.5000 mg/h | INTRAVENOUS | Status: DC
  Administered 2017-10-23: 1 mg/h via INTRAVENOUS
  Administered 2017-10-23: 4 mg/h via INTRAVENOUS
  Filled 2017-10-23 (×3): qty 5

## 2017-10-23 MED ORDER — HYDROMORPHONE HCL 1 MG/ML IJ SOLN
1.0000 mg | INTRAMUSCULAR | Status: DC | PRN
  Administered 2017-10-23: 2 mg via INTRAVENOUS

## 2017-10-23 NOTE — Progress Notes (Addendum)
PULMONARY / CRITICAL CARE MEDICINE   Name: Stephanie Young MRN: [id number] DOB: [birth year]    ADMISSION DATE:  10/12/2017 CONSULTATION DATE:  10/16/17  LOS 9 days  REFERRING MD: Dr. Wynelle Cleveland  CHIEF COMPLAINT:  Worsening dyspnea  Brief : 27yo female with no significant PMH presenting 8/8 with cough, SOB and CP, diagnosed with ARDS with negative blood and respiratory cultures.   ED Course: pt was found to have  WBC 16.8, lactic acid 0.84, negative pregnancy test, potassium 3.3, creatinine normal, temperature 99.7, tachycardia, tachypnea, O2 sat are 96% on room air. CT angiogram is negative for PE, but showed multifocal pneumonia. Patient is placed on telemetry bed for observation.  8/11 worsening respiratory distress.   tired and has increased work of breathing. Has a temp today of 100.7 The patient has a Pa02 of 57 on a NRM. Her recent CT scan chest shows multifocal infitlrates Her urine for Strep and Legionella was negative. Her MRSA nasal spec was negative for MRSA. Her CXR appears much worse today than iot did on 8/7. WBC is 16K and Procal 0.56. The patient has no hitory of recreational drug use, recent travel. Had a negative HIV test.The patient is on no immunosuppressive therapy.  She does not smoke. Ha s most recently been on Azithromax, Unasyn and Vanco. The patient has been seen by ID. Gyn bateria - negative RVP negative    10/17/2017 - 450mg fent + 80 diprivan + 0.533mversed. -> arousable but then desatus easy. Follows commands. On vent. S/p bronch yesterday -> 49% polys, 26% lymphs, 40% fio2, peep 10, -Significant vent dysnchroncy yesterday is improved. Autoimmune. HP and vasculitis profile - negative   10/18/17 - fentanyl, diprivan and versed gtt /. On Unasynm and Vanc. Fever curve trending down. CXR and pulse ox better after lasx x 1 yesterday . +17.6L since admit   8/14 - diuresed and now +16+ L but cxr worse, hyppoxemia worse with fio2 at 100%. On versed 109mnd fent  400m53m RASS -3. - opens eyes when stimulated per RN. BIs 95 currently. Abx changed - repeat culture trach, increase lasix, start nimbex   8/15 - fever improved vnadir before respike. Down to +14L with high dose lasix. Now on diamox for contraction alk. CXR vastly better. Not on pressors. On fent gtt, versed, and diprivan -> BIS 35. On nimbex. -. TOF 0/4  On fio2 50%/peep 10 -> pulse ox 95%   8/16 - 40% fio2/peep 10, On fent , versed gtt. Nimbex holiday during day but restarted overnight due to vent dysnchrony. Not on pressors. BIS in 30s. Fluid balance +14L  After lasix dropped. Fever - resolved after abx unasun chagned to cefepime. No growtih from BAL 8/11 and trach aspirate 10/19/17   8/17 - Hx of VAPE use obtained from mom and BF yesterday. Dx is VAPE ARDS - reported to poison control. Rec: supporiotve care +/- steroids. Currently off nimbex. On fent, versed, diprivan -> WUA results in emotional address but following commands. CXR clear                               FEbrile again last night to 101F. Culture negatgive so far   SUBJECTIVE/OVERNIGHT/INTERVAL HX Extubated yesterday. Pt oriented x3 this am. Difficulty speaking and moving her lips, states she is very tired. Having some anxiety. Had nausea vomiting this am but was given zofran. Now stating she is hungry.  VITAL SIGNS: Blood pressure (!) 146/76, pulse 93, temperature 99.5 F (37.5 C), temperature source Oral, resp. rate (!) 26, height _0  (1.651 m), weight 70.2 kg, SpO2 97 %.   Constitution: mild distress, anxious, appears stated age, lying supine in bed,  HEENT: left eye dilated more than right, but both reactive  Cardio: RRR, no m/r/g  Respiratory: clear to auscultation Abdominal: hypoactive BS, soft, NTTP MSK: right UE weaker than the left GU: foley catheter in place Neuro: A&Ox3, pleasant, cooperative, anxious, CN II-XII intact - all decreased  Skin: C/D/I    VENT Vent Mode: PRVC FiO2 (%):  [40 %] 40 % Set  Rate:  [20 bmp] 20 bmp Vt Set:  [350 mL] 350 mL PEEP:  [5 cmH20] 5 cmH20 Plateau Pressure:  [11 cmH20] 11 cmH20   INTAKE / OUTPUT:  Intake/Output Summary (Last 24 hours) at 10/25/2017 0706 Last data filed at 10/25/2017 0600 Gross per 24 hour  Intake 267.22 ml  Output 2895 ml  Net -2627.78 ml     PULMONARY Recent Labs  Lab 10/19/17 1506 10/19/17 1655 10/20/17 0324 10/20/17 0416 10/22/17 0343  PHART 7.377 7.471* 7.530* 7.442 7.393  PCO2ART 78.1* 59.6* 53.2* 66.4* 55.6*  PO2ART 105.0 78.0* 127.0* 120.0* 117.0*  HCO3 45.1* 43.4* 44.5* 45.4* 33.9*  TCO2 47* 45* 46* 47* 36*  O2SAT 97.0 96.0 99.0 99.0 98.0    CBC Recent Labs  Lab 10/21/17 0417 10/22/17 0312 10/23/17 0213  HGB 9.4* 9.8* 9.6*  HCT 32.4* 32.8* 31.3*  WBC 12.8* 11.3* 12.4*  PLT 491* 562* 574*    COAGULATION No results for input(s): INR in the last 168 hours.  CARDIAC  No results for input(s): TROPONINI in the last 168 hours. No results for input(s): PROBNP in the last 168 hours.   CHEMISTRY Recent Labs  Lab 10/19/17 0504  10/20/17 0437 10/20/17 1800 10/20/17 2348 10/21/17 0417 10/21/17 3570 10/22/17 0000 10/22/17 0312 10/23/17 0213  NA  --    < >  --  140 141  --  140 143  --  144  K  --    < >  --  3.7 4.4  --  3.6 3.4*  --  3.9  CL  --    < >  --  99 99  --  104 100  --  104  CO2  --    < >  --  34* 33*  --  29 31  --  29  GLUCOSE  --    < >  --  113* 118*  --  118* 130*  --  102*  BUN  --    < >  --  24* 30*  --  28* 33*  --  33*  CREATININE  --    < >  --  0.54 0.53  --  0.57 0.70  --  0.49  CALCIUM  --    < >  --  8.0* 8.9  --  8.4* 8.6*  --  8.6*  MG 1.7  --  2.3  --   --  2.2  --   --  2.2 2.2  PHOS 3.8  --  5.1*  --   --  4.3  --   --  2.5 3.4   < > = values in this interval not displayed.   Estimated Creatinine Clearance: 108.8 mL/min (by C-G formula based on SCr of 0.49 mg/dL).   LIVER Recent Labs  Lab 10/18/17 0455  AST 20  ALT 13  ALKPHOS 47  BILITOT 0.5  PROT  5.4*  ALBUMIN 1.6*     INFECTIOUS Recent Labs  Lab 10/18/17 0455 10/19/17 0903 10/20/17 0437 10/21/17 0417  LATICACIDVEN 0.9  --   --   --   PROCALCITON  --  0.44 0.45 0.34     ENDOCRINE CBG (last 3)  Recent Labs    10/22/17 2309 10/23/17 0313 10/23/17 0747  GLUCAP 88 100* 100*    IMAGING x48h  - image(s) personally visualized  -   highlighted in bold Dg Chest Port 1 View  Result Date: 10/22/2017 CLINICAL DATA:  Endotracheal tube placement. EXAM: PORTABLE CHEST 1 VIEW COMPARISON:  10/21/2017 and 10/12/2017 FINDINGS: Nasogastric tube is coiled once over the stomach with tip and side-port within the stomach in the left upper quadrant. Endotracheal tube has tip 3.1 cm above the carina. Right-sided PICC line unchanged with tip just below the cavoatrial junction. Lungs are adequately inflated with persistent hazy patchy interstitial prominence over the mid to lower lungs unchanged. No lobar consolidation or effusion. Cardiomediastinal silhouette and remainder the exam is unchanged. IMPRESSION: Stable hazy interstitial prominence over the mid to lower lungs. Tubes and lines as described. Electronically Signed   By: Marin Olp M.D.   On: 10/22/2017 08:04    Principal Problem:   Multifocal pneumonia Active Problems:   Hypokalemia   Sepsis ([place])   Lobar pneumonia ([place])   Acute respiratory failure with hypoxia (HCC)   ARDS (adult respiratory distress syndrome) ([place])     ASSESSMENT / PLAN:   27yo female with no significant PMH presented 8/08 with 4 days chest pain and SOB. Discussed case with boyfriend who states patient's brother died from MI.    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 Case discussed with [place] Poison Control, unable to get in touch with city or state health departments yesterday. Extubated yesterday and stopped antibiotics. Sats at RA   SLP eval today - unable to perform yesterday due to AMS   will contact city & state health department   consider steroids per poison control if pt worsens <200 PaO2/FiO2 albuterol nebulizer q4h  Leukocytosis: cefepime held yesterday, 6/8 days. Vanc given 5 days  Likely dehydration as pan increase in CBC, 500cc LR bolus  UA with culture  Hypertension:   Metoprolol prn for high blood pressure   Nutrition:   500cc bolus LR SLP eval today Replete electrolytes  zofran for n/v   VTE: lovenox Diet: NPO IVF: 500 cc bolus LR   Zehava Turski A, DO 10/25/2017, 7:09 AM Pager: [phone]

## 2017-10-24 ENCOUNTER — Inpatient Hospital Stay (HOSPITAL_COMMUNITY): Payer: Self-pay

## 2017-10-24 DIAGNOSIS — A419 Sepsis, unspecified organism: Principal | ICD-10-CM

## 2017-10-24 LAB — BASIC METABOLIC PANEL
Anion gap: 8 (ref 5–15)
BUN: 37 mg/dL — AB (ref 6–20)
CALCIUM: 8.9 mg/dL (ref 8.9–10.3)
CO2: 31 mmol/L (ref 22–32)
CREATININE: 0.49 mg/dL (ref 0.44–1.00)
Chloride: 106 mmol/L (ref 98–111)
GFR calc Af Amer: >60 mL/min (ref >60)
GLUCOSE: 137 mg/dL — AB (ref 70–99)
Potassium: 3.8 mmol/L (ref 3.5–5.1)
Sodium: 145 mmol/L (ref 135–145)

## 2017-10-24 LAB — GLUCOSE, CAPILLARY
GLUCOSE-CAPILLARY: 102 mg/dL — AB (ref 70–99)
GLUCOSE-CAPILLARY: 105 mg/dL — AB (ref 70–99)
GLUCOSE-CAPILLARY: 113 mg/dL — AB (ref 70–99)
GLUCOSE-CAPILLARY: 127 mg/dL — AB (ref 70–99)
Glucose-Capillary: 107 mg/dL — ABNORMAL HIGH (ref 70–99)
Glucose-Capillary: 108 mg/dL — ABNORMAL HIGH (ref 70–99)
Glucose-Capillary: 117 mg/dL — ABNORMAL HIGH (ref 70–99)

## 2017-10-24 LAB — CBC WITH DIFFERENTIAL/PLATELET
BASOS PCT: 1 %
Basophils Absolute: 0.1 10*3/uL (ref 0.0–0.1)
EOS ABS: 0.4 10*3/uL (ref 0.0–0.7)
Eosinophils Relative: 3 %
HEMATOCRIT: 35.5 % — AB (ref 36.0–46.0)
Hemoglobin: 10.7 g/dL — ABNORMAL LOW (ref 12.0–15.0)
Lymphocytes Relative: 18 %
Lymphs Abs: 2.7 10*3/uL (ref 0.7–4.0)
MCH: 27.5 pg (ref 26.0–34.0)
MCHC: 30.1 g/dL (ref 30.0–36.0)
MCV: 91.3 fL (ref 78.0–100.0)
MONO ABS: 0.9 10*3/uL (ref 0.1–1.0)
Monocytes Relative: 6 %
NEUTROS ABS: 10.7 10*3/uL — AB (ref 1.7–7.7)
Neutrophils Relative %: 72 %
PLATELETS: 669 10*3/uL — AB (ref 150–400)
RBC: 3.89 MIL/uL (ref 3.87–5.11)
RDW: 14 % (ref 11.5–15.5)
WBC: 14.8 10*3/uL — ABNORMAL HIGH (ref 4.0–10.5)

## 2017-10-24 LAB — CK TOTAL AND CKMB (NOT AT ARMC)
CK, MB: 0.6 ng/mL (ref 0.5–5.0)
Relative Index: INVALID (ref 0.0–2.5)
Total CK: 61 U/L (ref 38–234)

## 2017-10-24 LAB — LEGIONELLA CULTURE REFLEX

## 2017-10-24 LAB — LEGIONELLA SPECIES CULTURE

## 2017-10-24 LAB — MAGNESIUM: Magnesium: 2.3 mg/dL (ref 1.7–2.4)

## 2017-10-24 LAB — LACTIC ACID, PLASMA: Lactic Acid, Venous: 0.7 mmol/L (ref 0.5–1.9)

## 2017-10-24 LAB — PHOSPHORUS: Phosphorus: 5.3 mg/dL — ABNORMAL HIGH (ref 2.5–4.6)

## 2017-10-24 MED ORDER — FUROSEMIDE 10 MG/ML IJ SOLN
40.0000 mg | Freq: Every day | INTRAMUSCULAR | Status: DC
  Administered 2017-10-24: 40 mg via INTRAVENOUS
  Filled 2017-10-24: qty 4

## 2017-10-24 MED ORDER — ONDANSETRON HCL 4 MG/2ML IJ SOLN: 4.0000 mg | Freq: Four times a day (QID) | INTRAMUSCULAR | Status: DC

## 2017-10-24 MED ORDER — ONDANSETRON HCL 4 MG/2ML IJ SOLN
4.0000 mg | Freq: Four times a day (QID) | INTRAMUSCULAR | Status: DC | PRN
  Administered 2017-10-24 – 2017-10-26 (×4): 4 mg via INTRAVENOUS
  Filled 2017-10-24 (×4): qty 2

## 2017-10-24 NOTE — Progress Notes (Signed)
SLP Cancellation Note  Patient Details Name: Stephanie SchneidersJuliana Young MRN: [id number] DOB: March 01, 1991   Cancelled treatment:       Reason Eval/Treat Not Completed: Other (comment) Discussed pt with RN, who says that her mentation remains altered from sedating medications. She is also coughing/gagging while trying to use swabs, and her voice is hoarse. Will give her additional time post-extubation and plan to f/u on next date for swallow evaluation. Would maintain NPO status for now.   Maxcine Hamaiewonsky, Burnette Valenti 10/24/2017, 3:59 PM  Maxcine HamLaura Paiewonsky, M.A. CCC-SLP [phone]

## 2017-10-24 NOTE — Procedures (Signed)
Extubation Procedure Note  Patient Details:   Name: Stephanie SchneidersJuliana Morales DOB: 1990/10/02 MRN: [id number]   Airway Documentation:    Vent end date: 10/24/17 Vent end time: 1110   Evaluation  O2 sats: stable throughout Complications: No apparent complications Patient did tolerate procedure well. Bilateral Breath Sounds: Clear   Yes   Patient extubated to 4lnc. Vitals stable at this time. RN at bedside. RT will continue to monitor.  Ave Filterdkins, Maddux Vanscyoc Williams 10/24/2017, 11:17 AM

## 2017-10-24 NOTE — Progress Notes (Signed)
Pulmonary Critical Care Medicine  Name: Stephanie Young MRN: [id number] DOB: @birthdate @  HPI:   27yo female with no significant PMH presenting 8/8 with cough, SOB and CP, diagnosed with ARDS with negative blood and respiratory cultures.   Subjective: SBT unsuccessful yesterday & 8/17 due to agitation & tachypnea. Vent synchrony this am with propofol 32, versed 5, dilaudid 8.    Objective:  Vital signs in last 24 hours: Vitals:   10/24/17 0801 10/24/17 0825 10/24/17 0900 10/24/17 1015  BP: (!) 82/74   131/72  Pulse: (!) 101  (!) 127 (!) 127  Resp:   (!) 23 13  Temp:  98.9 F (37.2 C)    TempSrc:  Oral    SpO2:   100% 100%  Weight:      Height:         VENT SETTINGS Vent Mode: PRVC FiO2 (%):  [40 %] 40 % Set Rate:  [20 bmp] 20 bmp Vt Set:  [350 mL] 350 mL PEEP:  [5 cmH20] 5 cmH20 Plateau Pressure:  [11 cmH20-18 cmH20] 11 cmH20   I/O's:  Intake/Output Summary (Last 24 hours) at 10/24/2017 1055 Last data filed at 10/24/2017 1000 Gross per 24 hour  Intake 2631.11 ml  Output 2345 ml  Net 286.11 ml      . sodium chloride Stopped (10/23/17 0551)  . ceFEPime (MAXIPIME) IV Stopped (10/24/17 0534)  . feeding supplement (VITAL AF 1.2 CAL) 50 mL/hr at 10/24/17 0900  . HYDROmorphone 4 mg/hr (10/24/17 1000)  . midazolam (VERSED) infusion 5 mg/hr (10/24/17 1000)  . propofol (DIPRIVAN) infusion 60 mcg/kg/min (10/24/17 1000)     ABG    Component Value Date/Time   PHART 7.393 10/22/2017 0343   PCO2ART 55.6 (H) 10/22/2017 0343   PO2ART 117.0 (H) 10/22/2017 0343   HCO3 33.9 (H) 10/22/2017 0343   TCO2 36 (H) 10/22/2017 0343   O2SAT 98.0 10/22/2017 0343      LABS:  CBC Latest Ref Rng & Units 10/24/2017 10/23/2017 10/22/2017  WBC 4.0 - 10.5 K/uL 14.8(H) 12.4(H) 11.3(H)  Hemoglobin 12.0 - 15.0 g/dL 10.7(L) 9.6(L) 9.8(L)  Hematocrit 36.0 - 46.0 % 35.5(L) 31.3(L) 32.8(L)  Platelets 150 - 400 K/uL 669(H) 574(H) 562(H)     BMP Latest Ref Rng & Units 10/24/2017 10/23/2017  10/22/2017  Glucose 70 - 99 mg/dL 137(H) 102(H) 130(H)  BUN 6 - 20 mg/dL 37(H) 33(H) 33(H)  Creatinine 0.44 - 1.00 mg/dL 0.49 0.49 0.70  Sodium 135 - 145 mmol/L 145 144 143  Potassium 3.5 - 5.1 mmol/L 3.8 3.9 3.4(L)  Chloride 98 - 111 mmol/L 106 104 100  CO2 22 - 32 mmol/L 31 29 31   Calcium 8.9 - 10.3 mg/dL 8.9 8.6(L) 8.6(L)    CMP Latest Ref Rng & Units 10/24/2017 10/23/2017 10/22/2017  Glucose 70 - 99 mg/dL 137(H) 102(H) 130(H)  BUN 6 - 20 mg/dL 37(H) 33(H) 33(H)  Creatinine 0.44 - 1.00 mg/dL 0.49 0.49 0.70  Sodium 135 - 145 mmol/L 145 144 143  Potassium 3.5 - 5.1 mmol/L 3.8 3.9 3.4(L)  Chloride 98 - 111 mmol/L 106 104 100  CO2 22 - 32 mmol/L 31 29 31   Calcium 8.9 - 10.3 mg/dL 8.9 8.6(L) 8.6(L)  Total Protein 6.5 - 8.1 g/dL - - -  Total Bilirubin 0.3 - 1.2 mg/dL - - -  Alkaline Phos 38 - 126 U/L - - -  AST 15 - 41 U/L - - -  ALT 0 - 44 U/L - - -    Hepatic  Function Latest Ref Rng & Units 10/18/2017 10/12/2017 02/25/2017  Total Protein 6.5 - 8.1 g/dL 5.4(L) 8.0 7.5  Albumin 3.5 - 5.0 g/dL 1.6(L) 3.7 4.9  AST 15 - 41 U/L 20 22 18   ALT 0 - 44 U/L 13 17 17   Alk Phosphatase 38 - 126 U/L 47 66 51  Total Bilirubin 0.3 - 1.2 mg/dL 0.5 1.4(H) 0.6  Bilirubin, Direct 0.0 - 0.2 mg/dL 0.2 0.2 0.15     PHYSICAL EXAM:  Constitution: ill appearing, +vent synchrony, OG tube in place Cardio: tachycardic, regular rhythm, nml S1, S2, no murmurs Respiratory: clear to auscultation, +diaphragmatic breathing, no wheezing, rales, rhonchi Abdominal: hypoactive BS, non-distended, soft MSK: no edema, well perfused, +pedal & radial pulses GU: cath in place, yellow urine Skin: clean, dry, intact  MICRO:  No growth blood cultures 5 days AFB smear negative MRSA negative Legionella negative Fungal negative Sputum cx negative resp viral panel negative   IMAGING:  Chest CT that I reviewed myself, infiltrate noted  LINES: ETT 8/11>>> PICC 8/12>>>  Assessment/Plan:  Principal Problem:    Multifocal pneumonia Active Problems:   Hypokalemia   Sepsis (HCC)   Lobar pneumonia (HCC)   Acute respiratory failure with hypoxia (HCC)   ARDS (adult respiratory distress syndrome) ([place])  27yo female with no significant PMH presented 8/08 with 4 days chest pain and SOB. Discussed case with boyfriend who states patient's brother died from MI.   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 Case discussed with [facility] Poison Control. Unable to extubate 8/17 or yesterday due to agitation, tachypnea although cxr improved yesterday. CXR today  dilaudid 8, propofol 70, versed 5, no pressors, seroquel held for QTc yesterday. No fever overnight.  SBT yesterday with agitation, tachypnea. Vent asynchrony this am.   - D/C all sedation - Wean to extubate today - Will contact city & state health department  - PRN albuterol - D/c abx today  Hypotension: cuff 132/71; Art line 78/66. +12L since admission. Echo 8/13 with EF 55% w/some septal hypokinesis. MAP 77   - Monitor - Levophed MAP <65  Nutrition: OG tube in place  - Hold TF for extubation - PPI protonix 81m po qd  VTE: lovenox Diet: tube feeds IVF: none  Will d/c all sedation, allow patient to wake up and extubate today.  The patient is critically ill with multiple organ systems failure and requires high complexity decision making for assessment and support, frequent evaluation and titration of therapies, application of advanced monitoring technologies and extensive interpretation of multiple databases.   Critical Care Time devoted to patient care services described in this note is  34  Minutes. This time reflects time of care of this signee Dr WJennet Maduro This critical care time does not reflect procedure time, or teaching time or supervisory time of PA/NP/Med student/Med Resident etc but could involve care discussion time.  WRush Farmer M.D. LUnity Medical And Surgical HospitalPulmonary/Critical Care Medicine. Pager: [phone] After hours pager:  [phone]

## 2017-10-24 NOTE — Progress Notes (Signed)
New bag of Dilaudid found in pt room at shift change. Bag delivered back to Pharmacy and handed to [place].

## 2017-10-24 NOTE — Progress Notes (Addendum)
Pulmonary Critical Care Medicine  Name: Stephanie Young MRN: [id number] DOB: @birthdate @  HPI:   27yo female with no significant PMH presenting 8/8 with cough, SOB and CP, diagnosed with ARDS with negative blood and respiratory cultures.   Subjective: SBT unsuccessful yesterday & 8/17 due to agitation & tachypnea. Vent synchrony this am with propofol 32, versed 5, dilaudid 8.    Objective:  Vital signs in last 24 hours: Vitals:   10/24/17 0400 10/24/17 0406 10/24/17 0500 10/24/17 0600  BP: 126/74  (!) 142/66 129/66  Pulse: (!) 103  (!) 121 (!) 114  Resp: 20  12 (!) 22  Temp:   99.7 F (37.6 C)   TempSrc:   Axillary   SpO2: 100% 100% 99% 99%  Weight:   70.2 kg   Height:         VENT SETTINGS Vent Mode: PRVC FiO2 (%):  [40 %] 40 % Set Rate:  [20 bmp] 20 bmp Vt Set:  [350 mL] 350 mL PEEP:  [5 cmH20] 5 cmH20 Plateau Pressure:  [13 cmH20-18 cmH20] 14 cmH20   I/O's:  Intake/Output Summary (Last 24 hours) at 10/24/2017 0638 Last data filed at 10/24/2017 0600 Gross per 24 hour  Intake 2621.25 ml  Output 2425 ml  Net 196.25 ml      . sodium chloride Stopped (10/23/17 0551)  . ceFEPime (MAXIPIME) IV Stopped (10/24/17 0534)  . feeding supplement (VITAL AF 1.2 CAL) 50 mL/hr at 10/23/17 2100  . HYDROmorphone 4 mg/hr (10/24/17 0600)  . midazolam (VERSED) infusion 5 mg/hr (10/24/17 0600)  . propofol (DIPRIVAN) infusion 70 mcg/kg/min (10/24/17 0600)     ABG    Component Value Date/Time   PHART 7.393 10/22/2017 0343   PCO2ART 55.6 (H) 10/22/2017 0343   PO2ART 117.0 (H) 10/22/2017 0343   HCO3 33.9 (H) 10/22/2017 0343   TCO2 36 (H) 10/22/2017 0343   O2SAT 98.0 10/22/2017 0343      LABS:  CBC Latest Ref Rng & Units 10/24/2017 10/23/2017 10/22/2017  WBC 4.0 - 10.5 K/uL 14.8(H) 12.4(H) 11.3(H)  Hemoglobin 12.0 - 15.0 g/dL 10.7(L) 9.6(L) 9.8(L)  Hematocrit 36.0 - 46.0 % 35.5(L) 31.3(L) 32.8(L)  Platelets 150 - 400 K/uL 669(H) 574(H) 562(H)     BMP Latest Ref Rng &  Units 10/24/2017 10/23/2017 10/22/2017  Glucose 70 - 99 mg/dL 137(H) 102(H) 130(H)  BUN 6 - 20 mg/dL 37(H) 33(H) 33(H)  Creatinine 0.44 - 1.00 mg/dL 0.49 0.49 0.70  Sodium 135 - 145 mmol/L 145 144 143  Potassium 3.5 - 5.1 mmol/L 3.8 3.9 3.4(L)  Chloride 98 - 111 mmol/L 106 104 100  CO2 22 - 32 mmol/L 31 29 31   Calcium 8.9 - 10.3 mg/dL 8.9 8.6(L) 8.6(L)    CMP Latest Ref Rng & Units 10/24/2017 10/23/2017 10/22/2017  Glucose 70 - 99 mg/dL 137(H) 102(H) 130(H)  BUN 6 - 20 mg/dL 37(H) 33(H) 33(H)  Creatinine 0.44 - 1.00 mg/dL 0.49 0.49 0.70  Sodium 135 - 145 mmol/L 145 144 143  Potassium 3.5 - 5.1 mmol/L 3.8 3.9 3.4(L)  Chloride 98 - 111 mmol/L 106 104 100  CO2 22 - 32 mmol/L 31 29 31   Calcium 8.9 - 10.3 mg/dL 8.9 8.6(L) 8.6(L)  Total Protein 6.5 - 8.1 g/dL - - -  Total Bilirubin 0.3 - 1.2 mg/dL - - -  Alkaline Phos 38 - 126 U/L - - -  AST 15 - 41 U/L - - -  ALT 0 - 44 U/L - - -  Hepatic Function Latest Ref Rng & Units 10/18/2017 10/12/2017 02/25/2017  Total Protein 6.5 - 8.1 g/dL 5.4(L) 8.0 7.5  Albumin 3.5 - 5.0 g/dL 1.6(L) 3.7 4.9  AST 15 - 41 U/L 20 22 18   ALT 0 - 44 U/L 13 17 17   Alk Phosphatase 38 - 126 U/L 47 66 51  Total Bilirubin 0.3 - 1.2 mg/dL 0.5 1.4(H) 0.6  Bilirubin, Direct 0.0 - 0.2 mg/dL 0.2 0.2 0.15     PHYSICAL EXAM:  Constitution: ill appearing, +vent synchrony, OG tube in place Cardio: tachycardic, regular rhythm, nml S1, S2, no murmurs Respiratory: clear to auscultation, +diaphragmatic breathing, no wheezing, rales, rhonchi Abdominal: hypoactive BS, non-distended, soft MSK: no edema, well perfused, +pedal & radial pulses GU: cath in place, yellow urine Skin: clean, dry, intact    MICRO:  No growth blood cultures 5 days AFB smear negative MRSA negative Legionella negative Fungal negative Sputum cx negative resp viral panel negative     IMAGING:    LINES:  PICC 8/12   Assessment/Plan:  Principal Problem:   Multifocal pneumonia Active  Problems:   Hypokalemia   Sepsis ([place])   Lobar pneumonia (HCC)   Acute respiratory failure with hypoxia (HCC)   ARDS (adult respiratory distress syndrome) ([place])   27yo female with no significant PMH presented 8/08 with 4 days chest pain and SOB. Discussed case with boyfriend who states patient's brother died from MI.    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 Case discussed with [facility] Poison Control. Unable to extubate 8/17 or yesterday due to agitation, tachypnea although cxr improved yesterday. CXR today  dilaudid 8, propofol 70, versed 5, no pressors, seroquel held for QTc yesterday. No fever overnight.  SBT yesterday with agitation, tachypnea. Vent asynchrony this am.   - weaned propofol to 50 with increased agitation, inc. To 60 - wean versed, klonopin .33m bid, haldol [id number] to aid - will contact city & state health department  - consider steroids per poison control if pt worsens <200 PaO2/FiO2 - cefepime - day 6/8 - albuterol nebulizer q4h  Hypotension: cuff 132/71; Art line 78/66. +12L since admission. Echo 8/13 with EF 55% w/some septal hypokinesis. MAP 77   - monitor - levophed MAP <65  Nutrition: OG tube in place  - cont. Tube feeds - PPI protonix 4039mo qd  VTE: lovenox Diet: tube feeds IVF: none   Stephanie Young A, DO 10/24/2017, 6:38 AM Pager: [phone]

## 2017-10-25 DIAGNOSIS — E876 Hypokalemia: Secondary | ICD-10-CM

## 2017-10-25 LAB — BASIC METABOLIC PANEL
ANION GAP: 16 — AB (ref 5–15)
BUN: 30 mg/dL — ABNORMAL HIGH (ref 6–20)
CALCIUM: 9.3 mg/dL (ref 8.9–10.3)
CO2: 29 mmol/L (ref 22–32)
Chloride: 104 mmol/L (ref 98–111)
Creatinine, Ser: 0.48 mg/dL (ref 0.44–1.00)
GFR calc non Af Amer: >60 mL/min (ref >60)
Glucose, Bld: 130 mg/dL — ABNORMAL HIGH (ref 70–99)
POTASSIUM: 3.2 mmol/L — AB (ref 3.5–5.1)
Sodium: 149 mmol/L — ABNORMAL HIGH (ref 135–145)

## 2017-10-25 LAB — CBC WITH DIFFERENTIAL/PLATELET
BASOS PCT: 1 %
Basophils Absolute: 0.2 10*3/uL — ABNORMAL HIGH (ref 0.0–0.1)
Eosinophils Absolute: 0 10*3/uL (ref 0.0–0.7)
Eosinophils Relative: 0 %
HCT: 37 % (ref 36.0–46.0)
HEMOGLOBIN: 11.3 g/dL — AB (ref 12.0–15.0)
LYMPHS PCT: 8 %
Lymphs Abs: 1.3 10*3/uL (ref 0.7–4.0)
MCH: 27 pg (ref 26.0–34.0)
MCHC: 30.5 g/dL (ref 30.0–36.0)
MCV: 88.3 fL (ref 78.0–100.0)
MONOS PCT: 3 %
Monocytes Absolute: 0.5 10*3/uL (ref 0.1–1.0)
NEUTROS ABS: 14.8 10*3/uL — AB (ref 1.7–7.7)
Neutrophils Relative %: 88 %
Platelets: 785 10*3/uL — ABNORMAL HIGH (ref 150–400)
RBC: 4.19 MIL/uL (ref 3.87–5.11)
RDW: 14 % (ref 11.5–15.5)
WBC: 16.8 10*3/uL — ABNORMAL HIGH (ref 4.0–10.5)

## 2017-10-25 LAB — URINALYSIS, ROUTINE W REFLEX MICROSCOPIC
Bilirubin Urine: NEGATIVE
GLUCOSE, UA: NEGATIVE mg/dL
KETONES UR: 20 mg/dL — AB
Leukocytes, UA: NEGATIVE
NITRITE: NEGATIVE
PH: 5 (ref 5.0–8.0)
PROTEIN: 30 mg/dL — AB
Specific Gravity, Urine: 1.025 (ref 1.005–1.030)

## 2017-10-25 LAB — GLUCOSE, CAPILLARY
Glucose-Capillary: 113 mg/dL — ABNORMAL HIGH (ref 70–99)
Glucose-Capillary: 113 mg/dL — ABNORMAL HIGH (ref 70–99)
Glucose-Capillary: 117 mg/dL — ABNORMAL HIGH (ref 70–99)
Glucose-Capillary: 120 mg/dL — ABNORMAL HIGH (ref 70–99)
Glucose-Capillary: 123 mg/dL — ABNORMAL HIGH (ref 70–99)
Glucose-Capillary: 123 mg/dL — ABNORMAL HIGH (ref 70–99)

## 2017-10-25 LAB — PNEUMOCYSTIS JIROVECI SMEAR BY DFA: Pneumocystis jiroveci Ag: NEGATIVE

## 2017-10-25 LAB — MAGNESIUM: MAGNESIUM: 2.3 mg/dL (ref 1.7–2.4)

## 2017-10-25 LAB — PHOSPHORUS: Phosphorus: 3.1 mg/dL (ref 2.5–4.6)

## 2017-10-25 MED ORDER — LORAZEPAM 2 MG/ML IJ SOLN
0.5000 mg | Freq: Once | INTRAMUSCULAR | Status: AC
  Administered 2017-10-25: 0.5 mg via INTRAVENOUS
  Filled 2017-10-25: qty 1

## 2017-10-25 MED ORDER — POTASSIUM CHLORIDE 10 MEQ/50ML IV SOLN
10.0000 meq | INTRAVENOUS | Status: DC
  Administered 2017-10-25: 10 meq via INTRAVENOUS
  Filled 2017-10-25 (×2): qty 50

## 2017-10-25 MED ORDER — POTASSIUM CHLORIDE 10 MEQ/50ML IV SOLN
10.0000 meq | INTRAVENOUS | Status: AC
  Administered 2017-10-25 (×3): 10 meq via INTRAVENOUS
  Filled 2017-10-25 (×2): qty 50

## 2017-10-25 MED ORDER — POTASSIUM CHLORIDE 10 MEQ/100ML IV SOLN: 10.0000 meq | INTRAVENOUS | Status: DC

## 2017-10-25 MED ORDER — DIAZEPAM 5 MG/ML IJ SOLN
2.5000 mg | Freq: Three times a day (TID) | INTRAMUSCULAR | Status: DC | PRN
  Administered 2017-10-25: 2.5 mg via INTRAVENOUS
  Filled 2017-10-25 (×3): qty 2

## 2017-10-25 MED ORDER — CHLORHEXIDINE GLUCONATE 0.12 % MT SOLN
15.0000 mL | Freq: Two times a day (BID) | OROMUCOSAL | Status: DC
  Administered 2017-10-25 – 2017-10-28 (×6): 15 mL via OROMUCOSAL
  Filled 2017-10-25 (×7): qty 15

## 2017-10-25 MED ORDER — ORAL CARE MOUTH RINSE
15.0000 mL | Freq: Two times a day (BID) | OROMUCOSAL | Status: DC
  Administered 2017-10-25 – 2017-10-27 (×5): 15 mL via OROMUCOSAL

## 2017-10-25 MED ORDER — LACTATED RINGERS IV BOLUS
500.0000 mL | Freq: Once | INTRAVENOUS | Status: AC
  Administered 2017-10-25: 500 mL via INTRAVENOUS

## 2017-10-25 MED ORDER — LACTATED RINGERS IV SOLN
INTRAVENOUS | Status: DC
  Administered 2017-10-25: 19:00:00 via INTRAVENOUS
  Administered 2017-10-25: 75 mL/h via INTRAVENOUS

## 2017-10-25 NOTE — Progress Notes (Signed)
Nutrition Follow-up  DOCUMENTATION CODES:   Not applicable  INTERVENTION:    If unable to safely begin PO diet by tomorrow, consider placement of Cortrak tube for TF initiation. Cortrak service is available M-W-F-Sat.  Jevity 1.2 at 65 ml/h would provide 1872 kcal, 87 gm protein, 1264 ml free water daily.  NUTRITION DIAGNOSIS:   Inadequate oral intake related to acute illness as evidenced by NPO status(pt sedated and ventilated ).  Ongoing  GOAL:   Provide needs based on ASPEN/SCCM guidelines  Unmet  MONITOR:   Diet advancement, PO intake, Labs, Skin, I & O's  ASSESSMENT:   27 y/o female admitted with PNA and ARDS (suspect related to vaping); no significant PMH.  Extubated 8/19. SLP following for ability to begin PO diet. Remains NPO at this time. OGT removed and TF off since extubation. Labs and medications reviewed.  Diet Order:   Diet Order            Diet NPO time specified  Diet effective now              EDUCATION NEEDS:   No education needs have been identified at this time  Skin:  Skin Assessment: (MASD to groin & thigh)  Last BM:  8/20 (type 7)  Height:   Ht Readings from Last 1 Encounters:  10/13/17 5\' 5"  (1.651 m)    Weight:   Wt Readings from Last 1 Encounters:  10/24/17 70.2 kg    Ideal Body Weight:  56.8 kg  BMI:  Body mass index is 25.75 kg/m.  Estimated Nutritional Needs:   Kcal:  1850-2050  Protein:  80-100 gm  Fluid:  2 L    Joaquin CourtsKimberly Shavaun Osterloh, RD, LDN, CNSC Pager [phone] After Hours Pager [phone]

## 2017-10-25 NOTE — Progress Notes (Signed)
8.5 mg/1.525mL of Valium wasted. Witnessed by Mordecai RasmussenNicole Alexander, RN.

## 2017-10-25 NOTE — Progress Notes (Signed)
eLink Physician-Brief Progress Note Patient Name: Stephanie SchneidersJuliana Young DOB: Jan 09, 1991 MRN: [id number]   Date of Service  10/25/2017  HPI/Events of Note  Anxiety   eICU Interventions  Ativan x 1     Intervention Category Intermediate Interventions: Other:  Leslye PeerRobert S Nikkol Pai 10/25/2017, 3:14 AM

## 2017-10-25 NOTE — Evaluation (Signed)
Physical Therapy Evaluation Patient Details Name: Stephanie Young MRN: [id number] DOB: 1991/01/02 Today's Date: 10/25/2017   History of Present Illness  Pt is a 27 y.o. female admitted 10/13/17 with coughing, SOB and chest pain. 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 ETT 8/11-8/19. No significant PMH on file.    Clinical Impression  Pt presents with an overall decrease in functional mobility secondary to above. 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 Today, pt limited by fatigue/lethargy, generalized weakness and apparent cognitive deficits. Able to perform bed mobility and achieve partial standing 2x with maxA. SpO2 >90% on RA; VSS. Feel pt would benefit from intensive CIR-level therapies to maximize functional mobility and return to indep PLOF. Will follow acutely to address established goals.     Follow Up Recommendations CIR;Supervision/Assistance - 24 hour    Equipment Recommendations  (TBD)    Recommendations for Other Services OT consult;Rehab consult     Precautions / Restrictions Precautions Precautions: Fall Restrictions Weight Bearing Restrictions: No      Mobility  Bed Mobility Overal bed mobility: Needs Assistance Bed Mobility: Supine to Sit;Sit to Supine;Rolling Rolling: Max assist   Supine to sit: Max assist Sit to supine: Max assist   General bed mobility comments: Required max verbal/tactile cues for all mobility; providing very little assist overall. Bowel/bladder incontinence noted upon sitting EOB; maxA to roll, dependent for pericare.   Transfers Overall transfer level: Needs assistance Equipment used: None Transfers: Sit to/from Stand Sit to Stand: Max assist         General transfer comment: Partial standing with maxA 2x trials, but unable to achieve fully upright standing despite maxA and multimodal cues for bilat hip ext and upright posture  Ambulation/Gait                Stairs             Wheelchair Mobility    Modified Rankin (Stroke Patients Only)       Balance Overall balance assessment: Needs assistance   Sitting balance-Leahy Scale: Fair Sitting balance - Comments: Able to sit EOB with supervision, relying on forward flexed posture to maintain balance; able to hold head up with cues, but <20 sec     Standing balance-Leahy Scale: Zero                               Pertinent Vitals/Pain Pain Assessment: Faces Faces Pain Scale: Hurts a little bit(Grimacing when coughing although pt reports no pain) Pain Location: Generalized Pain Descriptors / Indicators: Grimacing Pain Intervention(s): Monitored during session    Home Living Family/patient expects to be discharged to:: Private residence Living Arrangements: Parent;Spouse/significant other(Mom and boyfriend) Available Help at Discharge: Family;Available PRN/intermittently Type of Home: House Home Access: Stairs to enter   Enter[employer] CorporationEntrance Stairs-Number of Steps: 1 Home Layout: One level        Prior Function Level of Independence: Independent         Comments: Works as LawyerCNA. Drives. Indep with all mobility     Hand Dominance        Extremity/Trunk Assessment   Upper Extremity Assessment Upper Extremity Assessment: Generalized weakness(not bringing hands all the way to mouth; grip <3/5)    Lower Extremity Assessment Lower Extremity Assessment: Generalized weakness(Moving WFL against gravity while supine and max cues; but functionally moving <3/5 when attempting EOB/OOB mobility)    Cervical / Trunk Assessment Cervical /  Trunk Assessment: Kyphotic;Other exceptions Cervical / Trunk Exceptions: Foward head  Communication   Communication: Expressive difficulties(difficult to understand, not moving mouth to form words, soft)  Cognition Arousal/Alertness: Lethargic Behavior During Therapy: Flat affect Overall Cognitive Status: Difficult to assess Area of Impairment:  Attention;Following commands;Problem solving                   Current Attention Level: Sustained   Following Commands: Follows one step commands with increased time;Follows one step commands inconsistently     Problem Solving: Requires verbal cues;Slow processing;Decreased initiation;Requires tactile cues General Comments: Mother reports baseline cognition WFL. Very difficult to understand pt beyond yes/no questions; barely using mouth to form soft words. Required MAX cues and encouragement for participation. Difficult to determine if today's cognition due to strictly cognitive deficits or also with personality component      General Comments General comments (skin integrity, edema, etc.): Mother and boyfriend present during session. Both supportive, but did ask to step out as mother attempting to converse with pt throughout    Exercises     Assessment/Plan    PT Assessment Patient needs continued PT services  PT Problem List Decreased strength;Decreased activity tolerance;Decreased balance;Decreased mobility;Decreased cognition;Decreased knowledge of use of DME;Cardiopulmonary status limiting activity       PT Treatment Interventions DME instruction;Gait training;Stair training;Functional mobility training;Therapeutic activities;Therapeutic exercise;Balance training;Patient/family education;Neuromuscular re-education;Cognitive remediation    PT Goals (Current goals can be found in the Care Plan section)  Acute Rehab PT Goals Patient Stated Goal: Rest PT Goal Formulation: With patient Time For Goal Achievement: 11/08/17 Potential to Achieve Goals: Good    Frequency Min 3X/week   Barriers to discharge        Co-evaluation               AM-PAC PT "6 Clicks" Daily Activity  Outcome Measure Difficulty turning over in bed (including adjusting bedclothes, sheets and blankets)?: Unable Difficulty moving from lying on back to sitting on the side of the bed? :  Unable Difficulty sitting down on and standing up from a chair with arms (e.g., wheelchair, bedside commode, etc,.)?: Unable Help needed moving to and from a bed to chair (including a wheelchair)?: Total Help needed walking in hospital room?: Total Help needed climbing 3-5 steps with a railing? : Total 6 Click Score: 6    End of Session Equipment Utilized During Treatment: Gait belt Activity Tolerance: Patient limited by fatigue Patient left: in bed;with call bell/phone within reach;with family/visitor present;with bed alarm set Nurse Communication: Mobility status PT Visit Diagnosis: Other abnormalities of gait and mobility (R26.89);Muscle weakness (generalized) (M62.81)    Time: [id number] PT Time Calculation (min) (ACUTE ONLY): 40 min   Charges:   PT Evaluation $PT Eval Moderate Complexity: 1 Mod PT Treatments $Therapeutic Activity: 23-37 mins       Ina HomesJaclyn Kynnadi Dicenso, PT, DPT Acute Rehab Services  Pager: [phone]  Malachy ChamberJaclyn L Zonya Gudger 10/25/2017, 5:45 PM

## 2017-10-25 NOTE — Evaluation (Signed)
Clinical/Bedside Swallow Evaluation Patient Details  Name: Stephanie SchneidersJuliana Young MRN: [id number] Date of Birth: 1990/07/12  Today's Date: 10/25/2017 Time: SLP Start Time (ACUTE ONLY): 0931 SLP Stop Time (ACUTE ONLY): 0948 SLP Time Calculation (min) (ACUTE ONLY): 17 min  Past Medical History:  Past Medical History:  Diagnosis Date  . H. pylori infection   . Kidney stones    Past Surgical History:  Past Surgical History:  Procedure Laterality Date  . dental procedure     HPI:  Pt is a 27 yo female with no significant PMH presenting 8/8 with cough, SOB and CP. 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   Assessment / Plan / Recommendation Clinical Impression  Pt has saliva pooling in her mouth and spilling between her lips upon SLP arrival. She did not swallow them despite cueing, but did use oral suction to remove them. Once her mouth is clear she will perform a volitional swallow; oral motor exam is unremarkable. She does not however implement labial seal functionally. Anterior spillage is observed with all thin liquids; immediate, strong cough response is observed with small amounts of water. Suspect a post-extubation dysphagia given low, hoarse vocal quality and prolonged intubation; however, pt will also need SLP f/u to maximize oral manipulation and containment. SLP will f/u for readiness to start POs versus complete instrumental testing. Until that time, would keep NPO except for ice chips after oral care. Education was given to pt about the importance of managing her secretions to facilitate transition to a PO diet. SLP Visit Diagnosis: Dysphagia, oropharyngeal phase (R13.12)    Aspiration Risk  Moderate aspiration risk;Severe aspiration risk    Diet Recommendation NPO;Ice chips PRN after oral care   Medication Administration: Via alternative means    Other  Recommendations Oral Care Recommendations: Oral care QID Other Recommendations: Have  oral suction available   Follow up Recommendations (tba)      Frequency and Duration min 2x/week  2 weeks       Prognosis Prognosis for Safe Diet Advancement: Good      Swallow Study   General HPI: Pt is a 27 yo female with no significant PMH presenting 8/8 with cough, SOB and CP. 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 Type of Study: Bedside Swallow Evaluation Previous Swallow Assessment: none in chart Diet Prior to this Study: NPO Temperature Spikes Noted: No Respiratory Status: Room air History of Recent Intubation: Yes Length of Intubations (days): 9 days Date extubated: 10/24/17 Behavior/Cognition: Alert;Other (Comment)(tearful) Oral Cavity Assessment: Excessive secretions Oral Care Completed by SLP: Recent completion by staff Oral Cavity - Dentition: Adequate natural dentition Self-Feeding Abilities: Total assist Patient Positioning: Upright in bed Baseline Vocal Quality: Low vocal intensity;Hoarse Volitional Cough: Weak Volitional Swallow: Able to elicit    Oral/Motor/Sensory Function Overall Oral Motor/Sensory Function: Within functional limits   Ice Chips Ice chips: Impaired Presentation: Spoon Oral Phase Impairments: Reduced labial seal Oral Phase Functional Implications: Right anterior spillage;Left anterior spillage   Thin Liquid Thin Liquid: Impaired Presentation: Cup;Self Fed;Spoon Oral Phase Impairments: Reduced labial seal Oral Phase Functional Implications: Right anterior spillage;Left anterior spillage Pharyngeal  Phase Impairments: Cough - Immediate    Nectar Thick Nectar Thick Liquid: Not tested   Honey Thick Honey Thick Liquid: Not tested   Puree Puree: Not tested   Solid     Solid: Not tested      Stephanie Young, Shaletta Hinostroza 10/25/2017,10:06 AM  Stephanie HamLaura Young, M.A. CCC-SLP [phone]

## 2017-10-25 NOTE — Progress Notes (Signed)
Pulmonary Critical Care Medicine  Name: Stephanie Young MRN: [id number] DOB: @birthdate @  HPI:   27yo female with no significant PMH presenting 8/8 with cough, SOB and CP, diagnosed with ARDS with negative blood and respiratory cultures.   Subjective:  C/o neck pain, no other events overnight  Objective:  Vital signs in last 24 hours: Vitals:   10/25/17 0500 10/25/17 0600 10/25/17 0700 10/25/17 0757  BP: 136/81 (!) 146/76 (!) 146/72   Pulse: 96 93 95   Resp: (!) 24 (!) 26 (!) 32   Temp:    98.9 F (37.2 C)  TempSrc:    Oral  SpO2: 98% 97% 97%   Weight:      Height:       VENT SETTINGS    I/O's:  Intake/Output Summary (Last 24 hours) at 10/25/2017 1010 Last data filed at 10/25/2017 0800 Gross per 24 hour  Intake 61.19 ml  Output 2950 ml  Net -2888.81 ml   . sodium chloride Stopped (10/23/17 0551)  . lactated ringers 500 mL (10/25/17 0830)  . potassium chloride     ABG    Component Value Date/Time   PHART 7.393 10/22/2017 0343   PCO2ART 55.6 (H) 10/22/2017 0343   PO2ART 117.0 (H) 10/22/2017 0343   HCO3 33.9 (H) 10/22/2017 0343   TCO2 36 (H) 10/22/2017 0343   O2SAT 98.0 10/22/2017 0343   LABS:  CBC Latest Ref Rng & Units 10/25/2017 10/24/2017 10/23/2017  WBC 4.0 - 10.5 K/uL 16.8(H) 14.8(H) 12.4(H)  Hemoglobin 12.0 - 15.0 g/dL 11.3(L) 10.7(L) 9.6(L)  Hematocrit 36.0 - 46.0 % 37.0 35.5(L) 31.3(L)  Platelets 150 - 400 K/uL 785(H) 669(H) 574(H)   BMP Latest Ref Rng & Units 10/25/2017 10/24/2017 10/23/2017  Glucose 70 - 99 mg/dL 130(H) 137(H) 102(H)  BUN 6 - 20 mg/dL 30(H) 37(H) 33(H)  Creatinine 0.44 - 1.00 mg/dL 0.48 0.49 0.49  Sodium 135 - 145 mmol/L 149(H) 145 144  Potassium 3.5 - 5.1 mmol/L 3.2(L) 3.8 3.9  Chloride 98 - 111 mmol/L 104 106 104  CO2 22 - 32 mmol/L 29 31 29   Calcium 8.9 - 10.3 mg/dL 9.3 8.9 8.6(L)    CMP Latest Ref Rng & Units 10/25/2017 10/24/2017 10/23/2017  Glucose 70 - 99 mg/dL 130(H) 137(H) 102(H)  BUN 6 - 20 mg/dL 30(H) 37(H) 33(H)   Creatinine 0.44 - 1.00 mg/dL 0.48 0.49 0.49  Sodium 135 - 145 mmol/L 149(H) 145 144  Potassium 3.5 - 5.1 mmol/L 3.2(L) 3.8 3.9  Chloride 98 - 111 mmol/L 104 106 104  CO2 22 - 32 mmol/L 29 31 29   Calcium 8.9 - 10.3 mg/dL 9.3 8.9 8.6(L)  Total Protein 6.5 - 8.1 g/dL - - -  Total Bilirubin 0.3 - 1.2 mg/dL - - -  Alkaline Phos 38 - 126 U/L - - -  AST 15 - 41 U/L - - -  ALT 0 - 44 U/L - - -    Hepatic Function Latest Ref Rng & Units 10/18/2017 10/12/2017 02/25/2017  Total Protein 6.5 - 8.1 g/dL 5.4(L) 8.0 7.5  Albumin 3.5 - 5.0 g/dL 1.6(L) 3.7 4.9  AST 15 - 41 U/L 20 22 18   ALT 0 - 44 U/L 13 17 17   Alk Phosphatase 38 - 126 U/L 47 66 51  Total Bilirubin 0.3 - 1.2 mg/dL 0.5 1.4(H) 0.6  Bilirubin, Direct 0.0 - 0.2 mg/dL 0.2 0.2 0.15     PHYSICAL EXAM:  Constitution: Acutely ill appearing female, NAD Cardio: RRR, Nl S1/S2 and -M/R/G Respiratory:  CTA bilaterally Abdominal: Soft, NT, ND and +BS MSK: -edema and -tenderness GU: cath in place, yellow urine Skin: clean, dry, intact  MICRO:  No growth blood cultures 5 days AFB smear negative MRSA negative Legionella negative Fungal negative Sputum cx negative resp viral panel negative   IMAGING:  Chest CT that I reviewed myself, infiltrate noted  LINES: ETT 8/11>>>8/19 PICC 8/12>>>  I reviewed CXR myself, improving infiltrate noted  Assessment/Plan:  Principal Problem:   Multifocal pneumonia Active Problems:   Hypokalemia   Sepsis ([place])   Lobar pneumonia ([facility])   Acute respiratory failure with hypoxia ([facility])   ARDS (adult respiratory distress syndrome) ([place])  27yo female with no significant PMH presented 8/08 with 4 days chest pain and SOB. Discussed case with boyfriend who states patient's brother died from MI.  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 Case discussed with [facility]. Unable to extubate 8/17 or yesterday due to agitation, tachypnea although cxr improved yesterday.  CXR today  dilaudid 8, propofol 70, versed 5, no pressors, seroquel held for QTc yesterday. No fever overnight.  SBT yesterday with agitation, tachypnea. Vent asynchrony this am.   - PRN xanax for anxiety but otherwise d/c all sedation - PRN albuterol - D/c abx, course complete  Hypotension: cuff 132/71; Art line 78/66. +12L since admission. Echo 8/13 with EF 55% w/some septal hypokinesis. MAP 77   - Monitor - D/C levophed  Nutrition: OG tube in place  - Start regular diet if passes SLP - D/C protonix  Anxiety: - Xanax PRN - Avoid sedation  Hypokalemia: - Kdur 40 meq PO x2 doses - BMET in AM  VTE: lovenox Diet: tube feeds IVF: none  Transfer to SDU and to [facility] service with PCCM off 8/21.  Rush Farmer, M.D. [facility] Pulmonary/Critical Care Medicine. Pager: [phone]. After hours pager: [phone].

## 2017-10-26 ENCOUNTER — Inpatient Hospital Stay (HOSPITAL_COMMUNITY): Payer: Self-pay

## 2017-10-26 DIAGNOSIS — E871 Hypo-osmolality and hyponatremia: Secondary | ICD-10-CM

## 2017-10-26 DIAGNOSIS — E870 Hyperosmolality and hypernatremia: Secondary | ICD-10-CM

## 2017-10-26 DIAGNOSIS — Z9189 Other specified personal risk factors, not elsewhere classified: Secondary | ICD-10-CM

## 2017-10-26 DIAGNOSIS — D62 Acute posthemorrhagic anemia: Secondary | ICD-10-CM

## 2017-10-26 DIAGNOSIS — D72829 Elevated white blood cell count, unspecified: Secondary | ICD-10-CM

## 2017-10-26 DIAGNOSIS — R0682 Tachypnea, not elsewhere classified: Secondary | ICD-10-CM

## 2017-10-26 LAB — CBC
HCT: 33.8 % — ABNORMAL LOW (ref 36.0–46.0)
Hemoglobin: 10.4 g/dL — ABNORMAL LOW (ref 12.0–15.0)
MCH: 27.7 pg (ref 26.0–34.0)
MCHC: 30.8 g/dL (ref 30.0–36.0)
MCV: 90.1 fL (ref 78.0–100.0)
PLATELETS: 719 10*3/uL — AB (ref 150–400)
RBC: 3.75 MIL/uL — AB (ref 3.87–5.11)
RDW: 14.5 % (ref 11.5–15.5)
WBC: 14 10*3/uL — AB (ref 4.0–10.5)

## 2017-10-26 LAB — POCT I-STAT 3, ART BLOOD GAS (G3+)
ACID-BASE EXCESS: 8 mmol/L — AB (ref 0.0–2.0)
BICARBONATE: 32.1 mmol/L — AB (ref 20.0–28.0)
O2 SAT: 99 %
TCO2: 33 mmol/L — AB (ref 22–32)
pCO2 arterial: 40 mmHg (ref 32.0–48.0)
pH, Arterial: 7.511 — ABNORMAL HIGH (ref 7.350–7.450)
pO2, Arterial: 143 mmHg — ABNORMAL HIGH (ref 83.0–108.0)

## 2017-10-26 LAB — GLUCOSE, CAPILLARY
GLUCOSE-CAPILLARY: 120 mg/dL — AB (ref 70–99)
Glucose-Capillary: 126 mg/dL — ABNORMAL HIGH (ref 70–99)
Glucose-Capillary: 118 mg/dL — ABNORMAL HIGH (ref 70–99)
Glucose-Capillary: 126 mg/dL — ABNORMAL HIGH (ref 70–99)
Glucose-Capillary: 117 mg/dL — ABNORMAL HIGH (ref 70–99)
Glucose-Capillary: 128 mg/dL — ABNORMAL HIGH (ref 70–99)

## 2017-10-26 LAB — BASIC METABOLIC PANEL
ANION GAP: 6 (ref 5–15)
ANION GAP: 7 (ref 5–15)
Anion gap: 6 (ref 5–15)
Anion gap: 9 (ref 5–15)
BUN: 35 mg/dL — AB (ref 6–20)
BUN: 34 mg/dL — ABNORMAL HIGH (ref 6–20)
BUN: 34 mg/dL — ABNORMAL HIGH (ref 6–20)
BUN: 28 mg/dL — ABNORMAL HIGH (ref 6–20)
CALCIUM: 9.1 mg/dL (ref 8.9–10.3)
CHLORIDE: 117 mmol/L — AB (ref 98–111)
CO2: 29 mmol/L (ref 22–32)
CO2: 29 mmol/L (ref 22–32)
CO2: 29 mmol/L (ref 22–32)
CO2: 27 mmol/L (ref 22–32)
Calcium: 8.8 mg/dL — ABNORMAL LOW (ref 8.9–10.3)
Calcium: 9.1 mg/dL (ref 8.9–10.3)
Calcium: 8.8 mg/dL — ABNORMAL LOW (ref 8.9–10.3)
Chloride: 117 mmol/L — ABNORMAL HIGH (ref 98–111)
Chloride: 117 mmol/L — ABNORMAL HIGH (ref 98–111)
Chloride: 110 mmol/L (ref 98–111)
Creatinine, Ser: 0.58 mg/dL (ref 0.44–1.00)
Creatinine, Ser: 0.51 mg/dL (ref 0.44–1.00)
Creatinine, Ser: 0.52 mg/dL (ref 0.44–1.00)
Creatinine, Ser: 0.57 mg/dL (ref 0.44–1.00)
GFR calc Af Amer: >60 mL/min (ref >60)
GFR calc Af Amer: >60 mL/min (ref >60)
GFR calc Af Amer: >60 mL/min (ref >60)
GFR calc non Af Amer: >60 mL/min (ref >60)
GFR calc non Af Amer: >60 mL/min (ref >60)
GLUCOSE: 132 mg/dL — AB (ref 70–99)
Glucose, Bld: 130 mg/dL — ABNORMAL HIGH (ref 70–99)
Glucose, Bld: 157 mg/dL — ABNORMAL HIGH (ref 70–99)
Glucose, Bld: 335 mg/dL — ABNORMAL HIGH (ref 70–99)
POTASSIUM: 3.3 mmol/L — AB (ref 3.5–5.1)
Potassium: 3.9 mmol/L (ref 3.5–5.1)
Potassium: 3.6 mmol/L (ref 3.5–5.1)
Potassium: 3.9 mmol/L (ref 3.5–5.1)
SODIUM: 152 mmol/L — AB (ref 135–145)
Sodium: 153 mmol/L — ABNORMAL HIGH (ref 135–145)
Sodium: 152 mmol/L — ABNORMAL HIGH (ref 135–145)
Sodium: 146 mmol/L — ABNORMAL HIGH (ref 135–145)

## 2017-10-26 LAB — URINE CULTURE: CULTURE: NO GROWTH

## 2017-10-26 LAB — MAGNESIUM
MAGNESIUM: 2.5 mg/dL — AB (ref 1.7–2.4)
Magnesium: 2.5 mg/dL — ABNORMAL HIGH (ref 1.7–2.4)

## 2017-10-26 LAB — PROCALCITONIN: Procalcitonin: <0.1 ng/mL

## 2017-10-26 LAB — CK: CK TOTAL: 87 U/L (ref 38–234)

## 2017-10-26 LAB — PHOSPHORUS: Phosphorus: 4.2 mg/dL (ref 2.5–4.6)

## 2017-10-26 LAB — LACTIC ACID, PLASMA: Lactic Acid, Venous: 0.8 mmol/L (ref 0.5–1.9)

## 2017-10-26 LAB — CORTISOL: Cortisol, Plasma: 30.6 ug/dL

## 2017-10-26 LAB — SODIUM, URINE, RANDOM: Sodium, Ur: 35 mmol/L

## 2017-10-26 LAB — AMMONIA: Ammonia: 26 umol/L (ref 9–35)

## 2017-10-26 MED ORDER — SODIUM CHLORIDE 0.9 % IV SOLN
2.0000 g | Freq: Three times a day (TID) | INTRAVENOUS | Status: DC
  Administered 2017-10-26 – 2017-10-29 (×10): 2 g via INTRAVENOUS
  Filled 2017-10-26 (×11): qty 2

## 2017-10-26 MED ORDER — LIDOCAINE VISCOUS HCL 2 % MT SOLN
15.0000 mL | OROMUCOSAL | Status: DC | PRN
  Filled 2017-10-26 (×2): qty 15

## 2017-10-26 MED ORDER — INSULIN ASPART 100 UNIT/ML ~~LOC~~ SOLN
0.0000 [IU] | Freq: Three times a day (TID) | SUBCUTANEOUS | Status: DC
  Administered 2017-10-26: 1 [IU] via SUBCUTANEOUS

## 2017-10-26 MED ORDER — POTASSIUM CHLORIDE 10 MEQ/50ML IV SOLN
10.0000 meq | INTRAVENOUS | Status: AC
  Administered 2017-10-26 (×4): 10 meq via INTRAVENOUS
  Filled 2017-10-26 (×4): qty 50

## 2017-10-26 MED ORDER — DEXTROSE 5 % IV SOLN
INTRAVENOUS | Status: DC
  Administered 2017-10-26: 90 mL/h via INTRAVENOUS

## 2017-10-26 MED ORDER — RESOURCE THICKENUP CLEAR PO POWD
ORAL | Status: DC | PRN
  Filled 2017-10-26: qty 125

## 2017-10-26 MED ORDER — DEXTROSE 5 % IV SOLN
INTRAVENOUS | Status: DC
  Administered 2017-10-26: 19:00:00 via INTRAVENOUS

## 2017-10-26 NOTE — Progress Notes (Addendum)
OT Cancellation Note  Patient Details Name: Stephanie Young MRN: [id number] DOB: 06-30-1990   Cancelled Treatment:    Reason Eval/Treat Not Completed: Patient at procedure or test/ unavailable. Pt in CT and then MBS. Will follow.  [name], Stephanie Young 10/26/2017, 2:15 PM  10/26/2017 Martie RoundJulie Kyion Gautier, OTR/L Pager: [phone]

## 2017-10-26 NOTE — Progress Notes (Addendum)
Pharmacy Antibiotic Note  [name] is a 27 y.o. female admitted on 10/12/2017 with questionable  pneumonia.  Patient was treated for 6 days of Cefepime from 8/14 to 8/19. Patient has low grade fever and MD restarting antibiotics with pending CT chest.  Pharmacy has been consulted for restart Cefepime dosing.  WBC is trending down. Tmax 100.9 in last 24 hours. SCr remains stable.   Plan: Restart Cefepime 2g IV every 8 hours.  Monitor renal function, culture results, and clinical status.   Height: _0  (165.1 cm) Weight: 147 lb 14.9 oz (67.1 kg) IBW/kg (Calculated) : 57  Temp (24hrs), Avg:99.2 F (37.3 C), Min:98.2 F (36.8 C), Max:100.9 F (38.3 C)  Recent Labs  Lab 10/22/17 0312 10/23/17 0213 10/24/17 0050 10/24/17 0439 10/25/17 0320 10/26/17 0455 10/26/17 0740  WBC 11.3* 12.4*  --  14.8* 16.8* 14.0*  --   CREATININE  --  0.49 0.49  --  0.48 0.58 0.51  LATICACIDVEN  --   --  0.7  --   --   --   --     Estimated Creatinine Clearance: 95.9 mL/min (by C-G formula based on SCr of 0.51 mg/dL).    No Known Allergies  Antimicrobials this admission: Vancomycin 8/10>>8/16 Unasyn 8/9 >>8/14 Cefepime 8/14 >>8/19; 8/21 >> Azith  8/8>>8/11 Rocephin 8/8>>8/9   Microbiology results: 8/20 Urine - negative 8/10 MRSA >> negative 8/8-9 Resp panel - neg 8/7 BCx >> NGFinal 8/11 legionella - neg 8/11 BAL - neg 8/12 Bcx - neg 8/14 TA - neg  Thank you for allowing pharmacy to be a part of this patient's care.  Sloan Leiter, PharmD, BCPS, BCCCP Clinical Pharmacist Clinical phone 10/26/2017 until 3;30PM [phone] Please refer to [facility] for [facility] numbers  10/26/2017 1:04 PM

## 2017-10-26 NOTE — Progress Notes (Signed)
Rehab Admissions Coordinator Note:  Per PT recommendation, Patient was screened by Stephanie Young for appropriateness for an Inpatient Acute Rehab Consult.  At this time, we are recommending Inpatient Rehab consult. AC will contact MD regarding IP Rehab Consult Order.   Stephanie Young 10/26/2017, 9:56 AM  I can be reached at [phone].

## 2017-10-26 NOTE — Progress Notes (Signed)
Modified Barium Swallow Progress Note  Patient Details  Name: Stephanie Young MRN: [id number] Date of Birth: 1991/01/04  Today's Date: 10/26/2017  Modified Barium Swallow completed.  Full report located under Chart Review in the Imaging Section.  Brief recommendations include the following:  Clinical Impression  Pt has a moderate-severe oropharyngeal dysphagia due to generalized weakness and oral deficits that do not appear consistent with a typical post-extbation dysphagia as well as suspected sensory changes and incomplete airway closure that is more common after a prolonged intubation. She needs Mod cues for oral opening for adequate bolus acceptance, but incomplete labial seals also results in anterior spillage throughout the study of liquids and saliva. Her posterior propulsion is slow with intermittent tongue pumping. There is residue on the tongue and in the valleculae with all consistencies, which is reduced with spontaneous second swallow. Her hyolaryngeal movement is reduced, as is her epiglottic deflection, base of tongue retraction, and pharyngeal squeeze. Airway compromise occurs during the swallow with nectar and honey thick liquids. Aspiration is silent, and she cannot produce a strong volitional cough for clearance. A chin tuck helps to improve airway protection with honey thick liquids and reduced vallecular residue with purees. UES relaxation appears reduced with intermittent backflow observed inthe cervical esophagus. One instance of backflow into the larynx occured after she swallowed her first bite of puree, but this did trigger a sensed cough that was productive of clearing aspirates. Given her weak cough, overall fatigue, and poor secretion management, would favor starting more conservatively. Instead of full meal trays, would allow her to have snacks that consist of small bites of puree and small sips of honey thick liquids. Full supervision should be used, and pt should tuck her  chin with each swallow. SLP will continue to follow for potential to advance.    Swallow Evaluation Recommendations       SLP Diet Recommendations: Dysphagia 1 (Puree) solids;Honey thick liquids(from floor stock only - do not order meal trays)   Liquid Administration via: Cup;Spoon;No straw   Medication Administration: Via alternative means(could try crushed in puree if necessary)   Supervision: Staff to assist with self feeding;Full supervision/cueing for compensatory strategies   Compensations: Slow rate;Small sips/bites;Monitor for anterior loss;Multiple dry swallows after each bite/sip;Chin tuck   Postural Changes: Remain semi-upright after after feeds/meals (Comment);Seated upright at 90 degrees   Oral Care Recommendations: Oral care QID   Other Recommendations: Order thickener from pharmacy;Prohibited food (jello, ice cream, thin soups);Have oral suction available;Remove water pitcher    Stephanie Young, Stephanie Young 10/26/2017,3:47 PM   Stephanie Young, M.A. Stephanie Young [phone]

## 2017-10-26 NOTE — Progress Notes (Signed)
Stephanie MountSylvester Whittany Parish, MD  [facility] Pager #: [phone] 7PM-7AM contact night coverage as above  PROGRESS NOTE    Stephanie SchneidersJuliana Young  UJW:[id number] DOB: 01-27-1991 DOA: 10/12/2017 PCP: Claiborne RiggFleming, [name], NP  Outpatient Specialists:     Brief Narrative: 27yo female with no significant PMH presenting 8/8 with cough, SOB and CP, diagnosed with ARDS with negative blood and respiratory cultures.  10/26/2017: Patient is new to me.  Patient was seen alongside patient's mother.  Patient's mother was communicated to via the language translating equipment.  All questions were answered.  Also discussed with the patient's nurse and Dr. Molli KnockYacoub.  Patient's nurse seems worried about the patient.  We will continue to reevaluate the patient.  We will proceed with CT scan of the chest without contrast, check ammonia level, ABG and lactic acid level.  Will restart patient on antibiotics for now.  Hyper natremia is noted, as well as hypokalemia.  Further management will depend on hospital course.   Assessment & Plan:   Principal Problem:   Multifocal pneumonia Active Problems:   Hypokalemia   Sepsis (HCC)   Lobar pneumonia (HCC)   Acute respiratory failure with hypoxia (HCC)   ARDS (adult respiratory distress syndrome) (HCC)  Multifocal pneumonia: CT scan of the chest without contrast. Check procalcitonin level. Restart IV antibiotics. Aspiration precautions.  ARDS/acute respiratory failure: Pulmonary input is highly appreciated. This is thought to be secondary to VAPE Repeat ABG.  Aspiration risk: Patient continues to drool. Patient continues to report sore throat Consider discussed lidocaine for numbing effect if okay with speech therapy team. Continue to monitor mental status.  Check ammonia level.  Hypernatremia: Fluid deficit is approximately 3.12 L. Cautiously hydrate with D5 water. Change to free water when feasible. Check BMP every 4 hours. Check urine sodium.  Suspect this  is all secondary to poor p.o. Intake.  Hypokalemia: Repleted. Repeat potassium level is 3.9.  Low albumin noted earlier.  This may be of prognostic significance.  Guarded prognosis.  Low threshold to proceed with further imaging of the brain mental status remains a problem.  For now, we will manage metabolic abnormalities.  Nutrition: Option includes via NG tube. Will consult dietary team.  Further management will depend on hospital course.   DVT prophylaxis: SCD Code Status: Full Family Communication: Mother Disposition Plan: Will depend on hospital course   Consultants:   Dietary team  Procedures:   None  Antimicrobials:   Restart IV Cefepime    Subjective: No significant history from patient.  Objective: Vitals:   10/26/17 0600 10/26/17 0700 10/26/17 0800 10/26/17 1100  BP: (!) 148/78 128/75 129/78   Pulse: (!) 54 (!) 58 60   Resp: (!) 24 (!) 22 19   Temp:   99.1 F (37.3 C) 98.3 F (36.8 C)  TempSrc:   Oral Oral  SpO2: 100% 100% 100%   Weight:      Height:        Intake/Output Summary (Last 24 hours) at 10/26/2017 1247 Last data filed at 10/26/2017 0800 Gross per 24 hour  Intake 1524.23 ml  Output 480 ml  Net 1044.23 ml   Filed Weights   10/20/17 0600 10/24/17 0500 10/26/17 0500  Weight: 76.2 kg 70.2 kg 67.1 kg    Examination:  General exam: Cachectic. Drooling.  Respiratory system: Clear to auscultation, but decreased air entry. Cardiovascular system: S1 & S2 heard. Gastrointestinal system: Abdomen is nondistended, soft and nontender. No organomegaly or masses felt. Normal bowel sounds heard. Central  nervous system: Awake and alert. Weak, but moves all limbs. Extremities: No leg edema.  Data Reviewed: I have personally reviewed following labs and imaging studies  CBC: Recent Labs  Lab 10/21/17 0417 10/22/17 0312 10/23/17 0213 10/24/17 0439 10/25/17 0320 10/26/17 0455  WBC 12.8* 11.3* 12.4* 14.8* 16.8* 14.0*  NEUTROABS 11.7*  8.1* 8.6* 10.7* 14.8*  --   HGB 9.4* 9.8* 9.6* 10.7* 11.3* 10.4*  HCT 32.4* 32.8* 31.3* 35.5* 37.0 33.8*  MCV 94.2 90.9 88.7 91.3 88.3 90.1  PLT 491* 562* 574* 669* 785* 719*   Basic Metabolic Panel: Recent Labs  Lab 10/22/17 0312 10/23/17 0213 10/24/17 0050 10/24/17 0439 10/25/17 0320 10/26/17 0455 10/26/17 0740  NA  --  144 145  --  149* 152* 153*  K  --  3.9 3.8  --  3.2* 3.3* 3.9  CL  --  104 106  --  104 117* 117*  CO2  --  29 31  --  29 29 29   GLUCOSE  --  102* 137*  --  130* 130* 132*  BUN  --  33* 37*  --  30* 35* 34*  CREATININE  --  0.49 0.49  --  0.48 0.58 0.51  CALCIUM  --  8.6* 8.9  --  9.3 8.8* 9.1  MG 2.2 2.2  --  2.3 2.3 2.5*  --   PHOS 2.5 3.4  --  5.3* 3.1 4.2  --    GFR: Estimated Creatinine Clearance: 95.9 mL/min (by C-G formula based on SCr of 0.51 mg/dL). Liver Function Tests: No results for input(s): AST, ALT, ALKPHOS, BILITOT, PROT, ALBUMIN in the last 168 hours. No results for input(s): LIPASE, AMYLASE in the last 168 hours. No results for input(s): AMMONIA in the last 168 hours. Coagulation Profile: No results for input(s): INR, PROTIME in the last 168 hours. Cardiac Enzymes: Recent Labs  Lab 10/24/17 0439 10/26/17 0740  CKTOTAL 61 87  CKMB 0.6  --    BNP (last 3 results) No results for input(s): PROBNP in the last 8760 hours. HbA1C: No results for input(s): HGBA1C in the last 72 hours. CBG: Recent Labs  Lab 10/25/17 2012 10/25/17 2341 10/26/17 0345 10/26/17 0747 10/26/17 1126  GLUCAP 113* 113* 120* 126* 118*   Lipid Profile: No results for input(s): CHOL, HDL, LDLCALC, TRIG, CHOLHDL, LDLDIRECT in the last 72 hours. Thyroid Function Tests: No results for input(s): TSH, T4TOTAL, FREET4, T3FREE, THYROIDAB in the last 72 hours. Anemia Panel: No results for input(s): VITAMINB12, FOLATE, FERRITIN, TIBC, IRON, RETICCTPCT in the last 72 hours. Urine analysis:    Component Value Date/Time   COLORURINE YELLOW 10/25/2017 0852    APPEARANCEUR CLOUDY (A) 10/25/2017 0852   LABSPEC 1.025 10/25/2017 0852   PHURINE 5.0 10/25/2017 0852   GLUCOSEU NEGATIVE 10/25/2017 0852   HGBUR MODERATE (A) 10/25/2017 0852   BILIRUBINUR NEGATIVE 10/25/2017 0852   BILIRUBINUR small 02/25/2017 1428   KETONESUR 20 (A) 10/25/2017 0852   PROTEINUR 30 (A) 10/25/2017 0852   UROBILINOGEN 0.2 02/25/2017 1428   NITRITE NEGATIVE 10/25/2017 0852   LEUKOCYTESUR NEGATIVE 10/25/2017 0852   Sepsis Labs: @LABRCNTIP (procalcitonin:4,lacticidven:4)  ) Recent Results (from the past 240 hour(s))  Culture, respiratory (non-expectorated)     Status: None   Collection Time: 10/16/17  1:20 PM  Result Value Ref Range Status   Specimen Description BRONCHIAL ALVEOLAR LAVAGE  Final   Special Requests NONE  Final   Gram Stain   Final    RARE WBC PRESENT, PREDOMINANTLY MONONUCLEAR NO  ORGANISMS SEEN    Culture   Final    NO GROWTH 2 DAYS Performed at [facility], [address] [place], [place] [address]    Report Status 10/18/2017 FINAL  Final  Fungus Culture With Stain     Status: None (Preliminary result)   Collection Time: 10/16/17  1:20 PM  Result Value Ref Range Status   Fungus Stain Final report  Final    Comment: (NOTE) Performed At: [facility] [address] [place], [place] [id number] Jolene SchimkeNagendra Sanjai MD XB:[id number]:[phone]    Fungus (Mycology) Culture PENDING  Incomplete   Fungal Source BRONCHIAL ALVEOLAR LAVAGE  Final    Comment: Performed at [facility], [address] [place], [place] [address]  Acid Fast Smear (AFB)     Status: None   Collection Time: 10/16/17  1:20 PM  Result Value Ref Range Status   AFB Specimen Processing Concentration  Final   Acid Fast Smear Negative  Final    Comment: (NOTE) Performed At: [facility] [place] [address] [place], [place] [id number] Jolene SchimkeNagendra Sanjai MD MW:[id number]:[phone]    Source (AFB) BRONCHIAL ALVEOLAR LAVAGE  Final    Comment: Performed at [facility], [address] [place], [place] [address]  Pneumocystis smear by DFA     Status: None   Collection Time: 10/16/17  1:20 PM  Result Value Ref Range Status   Specimen Source-PJSRC BRONCHIAL ALVEOLAR LAVAGE  Final   Pneumocystis jiroveci Ag NEGATIVE  Final    Comment: Performed at [facility] Performed at [facility], [address] [place], [place] [address]   Legionella Species Culture     Status: None   Collection Time: 10/16/17  1:20 PM  Result Value Ref Range Status   Legionella Species Culture Final report  Final    Comment: (NOTE) Performed At: [facility] [place] [address] [place], [place] [id number] Jolene SchimkeNagendra Sanjai MD OV:[id number]:[phone]    Source, Legionella Cul BRONCHIAL ALVEOLAR LAVAGE  Final    Comment: Performed at [facility], [address] [place], [place] [address]  Fungus Culture Result     Status: None   Collection Time: 10/16/17  1:20 PM  Result Value Ref Range Status   Result 1 Comment  Final    Comment: (NOTE) KOH/Calcofluor preparation:  no fungus observed. Performed At: [facility] [place] [address] [place], [place] [id number] Jolene SchimkeNagendra Sanjai MD XN:[id number]:[phone]   Legionella culture reflex     Status: None   Collection Time: 10/16/17  1:20 PM  Result Value Ref Range Status   Legionella culture result 1 Comment  Final    Comment: (NOTE) Culture Report: No Legionella species isolated. Performed At: [facility] [place] [address] [place], [place] [id number] Jolene SchimkeNagendra Sanjai MD SE:[id number]:[phone]   Legionella, DFA (w/out culture)     Status: None   Collection Time: 10/16/17  2:08 PM  Result Value Ref Range Status   Legionella Pneumophila DFA Negative Negative Final    Comment: (NOTE) Performed At: [facility] [place] [address] [place], [place] [id number] Jolene SchimkeNagendra Sanjai MD SW:[id number]:[phone]   Culture, blood (routine x 2)     Status: None   Collection Time: 10/17/17  6:46 AM  Result Value Ref Range Status   Specimen Description  BLOOD RIGHT ANTECUBITAL  Final   Special Requests   Final    BOTTLES DRAWN AEROBIC AND ANAEROBIC Blood Culture adequate volume   Culture   Final    NO  GROWTH 5 DAYS Performed at [facility], [address] [place], [place] [address]    Report Status 10/22/2017 FINAL  Final  Culture, blood (routine x 2)     Status: None   Collection Time: 10/17/17  6:46 AM  Result Value Ref Range Status   Specimen Description BLOOD LEFT HAND  Final   Special Requests   Final    BOTTLES DRAWN AEROBIC AND ANAEROBIC Blood Culture adequate volume   Culture   Final    NO GROWTH 5 DAYS Performed at [facility], [address] [place], [place] [address]    Report Status 10/22/2017 FINAL  Final  Culture, respiratory (non-expectorated)     Status: None   Collection Time: 10/19/17  1:27 PM  Result Value Ref Range Status   Specimen Description TRACHEAL ASPIRATE  Final   Special Requests NONE  Final   Gram Stain   Final    RARE WBC PRESENT,BOTH PMN AND MONONUCLEAR NO ORGANISMS SEEN    Culture   Final    NO GROWTH 2 DAYS Performed at [facility], [address] [place], [place] [address]    Report Status 10/21/2017 FINAL  Final  Culture, Urine     Status: None   Collection Time: 10/25/17 10:47 AM  Result Value Ref Range Status   Specimen Description URINE, CATHETERIZED  Final   Special Requests NONE  Final   Culture   Final    NO GROWTH Performed at [facility], [address], [place], [place] [address]    Report Status 10/26/2017 FINAL  Final         Radiology Studies: No results found.      Scheduled Meds: . chlorhexidine  15 mL Mouth Rinse BID  . Chlorhexidine Gluconate Cloth  6 each Topical Daily  . enoxaparin (LOVENOX) injection  40 mg Subcutaneous Daily  . insulin aspart  0-9 Units Subcutaneous TID WC  . mouth rinse  15 mL Mouth Rinse q12n4p  . multivitamin  15 mL Per Tube Daily  . potassium chloride  40 mEq Per Tube Daily  . sodium chloride  flush  10-40 mL Intracatheter Q12H   Continuous Infusions: . sodium chloride Stopped (10/23/17 0551)  . dextrose 90 mL/hr at 10/26/17 0800     LOS: 12 days    Time spent: 7 Minutes    Stephanie Mount, MD  [facility] Pager #: [phone] 7PM-7AM contact night coverage as above

## 2017-10-26 NOTE — Progress Notes (Addendum)
SLP Note:  SLP was called by RN/MD regarding use of lidocaine mouthwash. SLP shared that reduced oral control and silent aspiration observed on MBS earlier today put her at an increased risk for aspiration with use of this thin liquid. Should benefit of mouthwash outweigh the risks, would use the following precautions:  1. Use a chin tuck/anterior lean to minimize the potential for posterior spillage into the pharynx 2. Could consider administering lidocaine mouthwash via swab 3. Would not administer additional POs until lidocaine wears off   SLP will continue to follow.  Maxcine HamLaura Paiewonsky, M.A. CCC-SLP [phone]

## 2017-10-26 NOTE — Consult Note (Signed)
Physical Medicine and Rehabilitation Consult   Reason for Consult: Critical illness polyneuropathy.   Referring Physician: Dr. Francis Dowse   HPI: Stephanie Young is a 27 y.o. female with history of renal calculi otherwise in good health and was admitted on 10/12/17 with cough, SOB and chest pain. She was started on IV antibiotics due to concerns of sepsis due to multifocal PNA. She continued to worsen and antibiotic coverage broadened per ID input. She underwent intubation on 8/11 due to ARDS. CT chest negative for PE and infectious work up negative--lung injury felt to be due to vaping. She has completed 14 day antibiotic regimen and agitation resolving. She tolerated extubation on 08/19 and NPO recommended due to excessive oral secretions and MBS pending. PT evaluation done revealing severe deconditioning and CIR recommended due to functional deficits.   Review of Systems  Unable to perform ROS: Patient nonverbal   Past Medical History:  Diagnosis Date  . H. pylori infection   . Kidney stones     Past Surgical History:  Procedure Laterality Date  . dental procedure      Family History  Problem Relation Age of Onset  . Breast cancer Mother 41  . Hypertension Mother   . Hypertension Brother   . Diabetes Maternal Uncle   . Diabetes Other   . Colon cancer Neg Hx   . Colon polyps Neg Hx     Social History: Lives with family. Works as a Lawyer at [employer]. Per  reports that she has quit smoking. She has never used smokeless tobacco. She reports that she drinks alcohol on the weekends. Per  reports that she has current or past drug history. Drug: Marijuana.   Allergies: No Known Allergies    Medications Prior to Admission  Medication Sig Dispense Refill  . acetaminophen (TYLENOL) 500 MG tablet Take 1,000 mg by mouth every 6 (six) hours as needed for moderate pain.     Marland Kitchen albuterol (PROVENTIL HFA;VENTOLIN HFA) 108 (90 Base) MCG/ACT inhaler Inhale 2 puffs into the lungs every 6  (six) hours as needed for wheezing or shortness of breath.    . dicyclomine (BENTYL) 10 MG capsule Take 1 capsule (10 mg total) by mouth 4 (four) times daily -  before meals and at bedtime. As needed for abdominal cramping and loose stool 120 capsule 3  . tiZANidine (ZANAFLEX) 4 MG tablet Take 1 tablet (4 mg total) by mouth every 6 (six) hours as needed for muscle spasms. 60 tablet 1  . hydrocortisone (ANUSOL-HC) 2.5 % rectal cream Place 1 application rectally 2 (two) times daily. As needed for rectal discomfort (Patient not taking: Reported on 10/12/2017) 30 g 1    Home: Home Living Family/patient expects to be discharged to:: Private residence Living Arrangements: Parent, Spouse/significant other(Mom and boyfriend) Available Help at Discharge: Family, Available PRN/intermittently Type of Home: House Home Access: Stairs to enter Secretary/administrator of Steps: 1 Home Layout: One level  Functional History: Prior Function Level of Independence: Independent 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 Functional Status:  Mobility: Bed Mobility Overal bed mobility: Needs Assistance Bed Mobility: Supine to Sit, Sit to Supine, Rolling Rolling: Max assist Supine to sit: Max assist Sit to supine: Max assist General bed mobility comments: Required max verbal/tactile cues for all mobility; providing very little assist overall. Bowel/bladder incontinence noted upon sitting EOB; maxA to roll, dependent for pericare.  Transfers Overall transfer level: Needs assistance Equipment used: None Transfers: Sit to/from Stand  Sit to Stand: Max assist General transfer comment: Partial standing with maxA 2x trials, but unable to achieve fully upright standing despite maxA and multimodal cues for bilat hip ext and upright posture      ADL:    Cognition: Cognition Overall Cognitive Status: Difficult to assess Orientation Level: Oriented to person, Oriented to  place Cognition Arousal/Alertness: Lethargic Behavior During Therapy: Flat affect Overall Cognitive Status: Difficult to assess Area of Impairment: Attention, Following commands, Problem solving Current Attention Level: Sustained Following Commands: Follows one step commands with increased time, Follows one step commands inconsistently Problem Solving: Requires verbal cues, Slow processing, Decreased initiation, Requires tactile cues General Comments: Mother reports baseline cognition WFL. Very difficult to understand pt beyond yes/no questions; barely using mouth to form soft words. Required MAX cues and encouragement for participation. Difficult to determine if today's cognition due to strictly cognitive deficits or also with personality component Difficult to assess due to: Impaired communication   Blood pressure 129/78, pulse 60, temperature 99.1 F (37.3 C), temperature source Oral, resp. rate 19, height 5\' 5"  (1.651 m), weight 67.1 kg, SpO2 100 %. Physical Exam  Nursing note and vitals reviewed. Constitutional: She appears well-developed and well-nourished.  HENT:  Head: Normocephalic and atraumatic.  Eyes: EOM are normal. Right eye exhibits no discharge. Left eye exhibits no discharge.  Neck: Normal range of motion. Neck supple.  Cardiovascular: Regular rhythm.  +Bradycardia  Respiratory: Effort normal and breath sounds normal.  +[place]  GI: Soft. Bowel sounds are normal.  Musculoskeletal:  No edema or tenderness in extremities  Neurological: She is alert.  Bilateral facial weakness with tetraplegia.  Few beats clonus right. LLE hyperreflexic.  Motor: 2+-3-/5 throughout  Skin: Skin is warm and dry.  Psychiatric:  Unable to assess due to nonverbal    Results for orders placed or performed during the hospital encounter of 10/12/17 (from the past 24 hour(s))  Culture, Urine     Status: None   Collection Time: 10/25/17 10:47 AM  Result Value Ref Range   Specimen Description  URINE, CATHETERIZED    Special Requests NONE    Culture      NO GROWTH Performed at [facility], [address] [place], [place] [address]    Report Status 10/26/2017 FINAL   Glucose, capillary     Status: Abnormal   Collection Time: 10/25/17 11:28 AM  Result Value Ref Range   Glucose-Capillary 123 (H) 70 - 99 mg/dL   Comment 1 Capillary Specimen    Comment 2 Notify RN   Glucose, capillary     Status: Abnormal   Collection Time: 10/25/17  3:29 PM  Result Value Ref Range   Glucose-Capillary 120 (H) 70 - 99 mg/dL   Comment 1 Capillary Specimen    Comment 2 Notify RN   Glucose, capillary     Status: Abnormal   Collection Time: 10/25/17  8:12 PM  Result Value Ref Range   Glucose-Capillary 113 (H) 70 - 99 mg/dL   Comment 1 Notify RN   Glucose, capillary     Status: Abnormal   Collection Time: 10/25/17 11:41 PM  Result Value Ref Range   Glucose-Capillary 113 (H) 70 - 99 mg/dL   Comment 1 Notify RN   Glucose, capillary     Status: Abnormal   Collection Time: 10/26/17  3:45 AM  Result Value Ref Range   Glucose-Capillary 120 (H) 70 - 99 mg/dL  CBC     Status: Abnormal   Collection Time: 10/26/17  4:55 AM  Result Value Ref Range   WBC 14.0 (H) 4.0 - 10.5 K/uL   RBC 3.75 (L) 3.87 - 5.11 MIL/uL   Hemoglobin 10.4 (L) 12.0 - 15.0 g/dL   HCT 16.1 (L) 09.6 - 04.5 %   MCV 90.1 78.0 - 100.0 fL   MCH 27.7 26.0 - 34.0 pg   MCHC 30.8 30.0 - 36.0 g/dL   RDW 40.9 81.1 - 91.4 %   Platelets 719 (H) 150 - 400 K/uL  Basic metabolic panel     Status: Abnormal   Collection Time: 10/26/17  4:55 AM  Result Value Ref Range   Sodium 152 (H) 135 - 145 mmol/L   Potassium 3.3 (L) 3.5 - 5.1 mmol/L   Chloride 117 (H) 98 - 111 mmol/L   CO2 29 22 - 32 mmol/L   Glucose, Bld 130 (H) 70 - 99 mg/dL   BUN 35 (H) 6 - 20 mg/dL   Creatinine, Ser 7.82 0.44 - 1.00 mg/dL   Calcium 8.8 (L) 8.9 - 10.3 mg/dL   GFR calc non Af Amer >60 >60 mL/min   GFR calc Af Amer >60 >60 mL/min   Anion gap 6 5 - 15   Magnesium     Status: Abnormal   Collection Time: 10/26/17  4:55 AM  Result Value Ref Range   Magnesium 2.5 (H) 1.7 - 2.4 mg/dL  Phosphorus     Status: None   Collection Time: 10/26/17  4:55 AM  Result Value Ref Range   Phosphorus 4.2 2.5 - 4.6 mg/dL  Glucose, capillary     Status: Abnormal   Collection Time: 10/26/17  7:47 AM  Result Value Ref Range   Glucose-Capillary 126 (H) 70 - 99 mg/dL   Comment 1 Notify RN    No results found.  Assessment/Plan: Diagnosis: CIP Labs independently reviewed.  Records reviewed and summated above.  1. Does the need for close, 24 hr/day medical supervision in concert with the patient's rehab needs make it unreasonable for this patient to be served in a less intensive setting? Yes  2. Co-Morbidities requiring supervision/potential complications: multifocal PNA (cont abx per ID), ARDS, tachypnea (monitor RR and O2 Sats with increased physical exertion), hyponatremia (continue to monitor, treated necessary), hypokalemia (continue to monitor and replete as necessary), leukocytosis (cont to monitor for signs and symptoms of infection, further workup if indicated), ABLA (transfuse if necessary to ensure appropriate perfusion for increased activity tolerance) 3. Due to bladder management, bowel management, safety, skin/wound care, disease management, medication administration and patient education, does the patient require 24 hr/day rehab nursing? Yes 4. Does the patient require coordinated care of a physician, rehab nurse, PT (1-2 hrs/day, 5 days/week), OT (1-2 hrs/day, 5 days/week) and SLP (1-2 hrs/day, 5 days/week) to address physical and functional deficits in the context of the above medical diagnosis(es)? Yes Addressing deficits in the following areas: balance, endurance, locomotion, strength, transferring, bathing, dressing, feeding, grooming, toileting, speech, swallowing and psychosocial support 5. Can the patient actively participate in an intensive  therapy program of at least 3 hrs of therapy per day at least 5 days per week? Potentially 6. The potential for patient to make measurable gains while on inpatient rehab is excellent 7. Anticipated functional outcomes upon discharge from inpatient rehab are min assist  with PT, min assist with OT, independent and modified independent with SLP. 8. Estimated rehab length of stay to reach the above functional goals is: 23-27 days. 9. Anticipated D/C setting: Home 10. Anticipated post D/C  treatments: HH therapy and Home excercise program 11. Overall Rehab/Functional Prognosis: excellent  RECOMMENDATIONS: This patient's condition is appropriate for continued rehabilitative care in the following setting: CIR Patient has agreed to participate in recommended program. Yes Note that insurance prior authorization may be required for reimbursement for recommended care.  Comment: Rehab Admissions Coordinator to follow up.   I have personally performed a face to face diagnostic evaluation, including, but not limited to relevant history and physical exam findings, of this patient and developed relevant assessment and plan.  Additionally, I have reviewed and concur with the physician assistant's documentation above.   Maryla MorrowAnkit Izaha Shughart, MD, ABPMR Jacquelynn CreePamela S Love, PA-C 10/26/2017

## 2017-10-26 NOTE — Progress Notes (Signed)
PT Cancellation Note  Patient Details Name: Stephanie SchneidersJuliana Morales MRN: [id number] DOB: [birth year]   Cancelled Treatment:    Reason Eval/Treat Not Completed: Patient at procedure or test/unavailable. CT scan then MBS. Will follow-up for PT treatment as schedule permits.  Ina HomesJaclyn Trinika Cortese, PT, DPT Acute Rehab Services  Pager: [phone]  Malachy ChamberJaclyn L Kayler Rise 10/26/2017, 2:16 PM

## 2017-10-26 NOTE — Progress Notes (Signed)
  Speech Language Pathology Treatment: Dysphagia  Patient Details Name: Stephanie SchneidersJuliana Young MRN: [id number] DOB: [birth year] Today's Date: 10/26/2017 Time: [id number] SLP Time Calculation (min) (ACUTE ONLY): 13 min  Assessment / Plan / Recommendation Clinical Impression  Pt had significantly less pooling of saliva in her oral cavity at baseline, although anterior spillage with trials of thin liquids is still observed, as is strong, immediate coughing. No overt signs of aspiration are observed with boluses of puree, but she exhibits reduced labial seal, prolonged posterior transit, multiple swallows, and limited hyolaryngeal movement upon palpation. Given her significant, generalized weakness and her prolonged intubation, recommend proceeding with MBS to better assess oropharyngeal function. Would remain NPO except ice chips until that time.   HPI HPI: Pt is a 27 yo female with no significant PMH presenting 8/8 with cough, SOB and CP. 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      SLP Plan  MBS       Recommendations  Diet recommendations: NPO;Other(comment)(ice chips okay after oral care) Medication Administration: Via alternative means                Oral Care Recommendations: Oral care QID Follow up Recommendations: Inpatient Rehab SLP Visit Diagnosis: Dysphagia, oropharyngeal phase (R13.12) Plan: MBS       GO                Stephanie Young, Stephanie Young 10/26/2017, 9:43 AM  Stephanie HamLaura Young, M.A. CCC-SLP [phone]

## 2017-10-27 LAB — RENAL FUNCTION PANEL
Albumin: 2.7 g/dL — ABNORMAL LOW (ref 3.5–5.0)
Anion gap: 7 (ref 5–15)
BUN: 23 mg/dL — ABNORMAL HIGH (ref 6–20)
CO2: 25 mmol/L (ref 22–32)
Calcium: 9.1 mg/dL (ref 8.9–10.3)
Chloride: 114 mmol/L — ABNORMAL HIGH (ref 98–111)
Creatinine, Ser: 0.55 mg/dL (ref 0.44–1.00)
GFR calc Af Amer: >60 mL/min (ref >60)
GFR calc non Af Amer: >60 mL/min (ref >60)
Glucose, Bld: 141 mg/dL — ABNORMAL HIGH (ref 70–99)
Phosphorus: 3.5 mg/dL (ref 2.5–4.6)
Potassium: 3.2 mmol/L — ABNORMAL LOW (ref 3.5–5.1)
Sodium: 146 mmol/L — ABNORMAL HIGH (ref 135–145)

## 2017-10-27 LAB — CBC WITH DIFFERENTIAL/PLATELET
Abs Immature Granulocytes: 0.3 10*3/uL — ABNORMAL HIGH (ref 0.0–0.1)
Basophils Absolute: 0.2 10*3/uL — ABNORMAL HIGH (ref 0.0–0.1)
Basophils Relative: 2 %
Eosinophils Absolute: 0.1 10*3/uL (ref 0.0–0.7)
Eosinophils Relative: 1 %
HCT: 34 % — ABNORMAL LOW (ref 36.0–46.0)
Hemoglobin: 10.1 g/dL — ABNORMAL LOW (ref 12.0–15.0)
Immature Granulocytes: 2 %
Lymphocytes Relative: 15 %
Lymphs Abs: 1.8 10*3/uL (ref 0.7–4.0)
MCH: 27.3 pg (ref 26.0–34.0)
MCHC: 29.7 g/dL — ABNORMAL LOW (ref 30.0–36.0)
MCV: 91.9 fL (ref 78.0–100.0)
Monocytes Absolute: 1 10*3/uL (ref 0.1–1.0)
Monocytes Relative: 8 %
Neutro Abs: 8.7 10*3/uL — ABNORMAL HIGH (ref 1.7–7.7)
Neutrophils Relative %: 72 %
Platelets: 658 10*3/uL — ABNORMAL HIGH (ref 150–400)
RBC: 3.7 MIL/uL — ABNORMAL LOW (ref 3.87–5.11)
RDW: 14.4 % (ref 11.5–15.5)
WBC: 12.1 10*3/uL — ABNORMAL HIGH (ref 4.0–10.5)

## 2017-10-27 LAB — GLUCOSE, CAPILLARY
GLUCOSE-CAPILLARY: 117 mg/dL — AB (ref 70–99)
GLUCOSE-CAPILLARY: 112 mg/dL — AB (ref 70–99)

## 2017-10-27 LAB — MAGNESIUM: Magnesium: 2.2 mg/dL (ref 1.7–2.4)

## 2017-10-27 MED ORDER — POTASSIUM CL IN DEXTROSE 5% 20 MEQ/L IV SOLN
20.0000 meq | INTRAVENOUS | Status: DC
  Administered 2017-10-27 – 2017-11-01 (×7): 20 meq via INTRAVENOUS
  Filled 2017-10-27 (×9): qty 1000

## 2017-10-27 MED ORDER — POTASSIUM CHLORIDE 20 MEQ/15ML (10%) PO SOLN: 40.0000 meq | Freq: Every day | ORAL | Status: DC

## 2017-10-27 MED ORDER — ADULT MULTIVITAMIN W/MINERALS CH
1.0000 | ORAL_TABLET | Freq: Every day | ORAL | Status: DC
  Administered 2017-10-29 – 2017-11-01 (×2): 1 via ORAL
  Filled 2017-10-27 (×6): qty 1

## 2017-10-27 MED ORDER — POTASSIUM CHLORIDE 10 MEQ/50ML IV SOLN
10.0000 meq | INTRAVENOUS | Status: AC
  Administered 2017-10-27 (×4): 10 meq via INTRAVENOUS
  Filled 2017-10-27 (×4): qty 50

## 2017-10-27 NOTE — Progress Notes (Addendum)
Inpatient Rehabilitation-Admissions Coordinator    Met with patient at the bedside to discuss team's recommendation for inpatient rehabilitation. Shared booklets, expectations while in CIR, expected length of stay, and anticipated functional level at DC. Pt reports she will have adequate assistance at DC between her mother and grandmother.  Pt lethargic when speaking and appears to have difficulty in processing information provided; pt did give consent for [facility] to speak with her boyfriend regarding rehab venues. Plan to follow for timing of medical readiness, interest in CIR, and bed availability.   AC has also contacted financial counselor for assistance in this case. AC will follow up with pt and boyfriend tomorrow.   Jhonnie Garner, OTR/L  Rehab Admissions Coordinator  [phone] 10/27/2017 4:16 PM

## 2017-10-27 NOTE — Progress Notes (Signed)
  Speech Language Pathology Treatment: Dysphagia  Patient Details Name: Stephanie Young MRN: [id number] DOB: Feb 11, 1991 Today's Date: 10/27/2017 Time: [id number] SLP Time Calculation (min) (ACUTE ONLY): 14 min  Assessment / Plan / Recommendation Clinical Impression  Pt's affect remains flat, but she is more alert and speaking with mildly improved movement of her articulators, facilitating intelligibility of speech despite persistent hypophonia/dysphonia. Saliva was pooled in her oral cavity upon SLP arrival, but improved labial seal reduces the amount of anterior spillage that is observed. Only small amounts of spillage were noted with secretions; none was observed with boluses provided. Pt self-fed bites of puree and cup sips of honey thick liquids, needing Mod cues for implementation of chin tuck. One instance of wet vocal quality was observed, followed by a delayed cough, when pt swallows a cup sip of honey thick liquids before tucking her chin. Recommend to continue with snacks of puree and honey thick liquids for today (provided from floor stock - not ordering full meal trays). If use of chin tuck and endurance continue to improve, may be able to start a diet of these consistencies soon.   HPI HPI: Pt is a 27 yo female with no significant PMH presenting 8/8 with cough, SOB and CP. 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      SLP Plan  Continue with current plan of care       Recommendations  Diet recommendations: Other(comment)(snacks of purees/honey thick liquids okay from floor stock) Liquids provided via: Cup;Teaspoon;No straw Medication Administration: Crushed with puree Supervision: Patient able to self feed;Full supervision/cueing for compensatory strategies Compensations: Slow rate;Small sips/bites;Monitor for anterior loss;Multiple dry swallows after each bite/sip;Chin tuck Postural Changes and/or Swallow Maneuvers: Seated upright 90  degrees;Upright 30-60 min after meal                Oral Care Recommendations: Oral care QID Follow up Recommendations: Inpatient Rehab SLP Visit Diagnosis: Dysphagia, oropharyngeal phase (R13.12) Plan: Continue with current plan of care       GO                Stephanie Young, Stephanie Young 10/27/2017, 9:03 AM  Stephanie Young, M.A. CCC-SLP [phone]

## 2017-10-27 NOTE — Progress Notes (Signed)
Occupational Therapy Evaluation Patient Details Name: Stephanie Young MRN: [id number] DOB: 1990/04/04 Today's Date: 10/27/2017    History of Present Illness Pt is a 27 y.o. female admitted 10/13/17 with coughing, SOB and chest pain. 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 ETT 8/11-8/19. No significant PMH on file.   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 Pt drove, assisted with care for her boyfriend's 526 yo child and managed her own finances. Pt presents with significant cognitive deficits in addition to deficits listed below and currently requires +2 Mod A with mobility and Mod A with ADL tasks. Recommend neuro consult - discussed with Dr. Dartha Lodgegbata. Feel pt would benefit from rehab at [facility] due significant change in functional status. Will follow acutely to address established goals.     Follow Up Recommendations  CIR;Supervision/Assistance - 24 hour    Equipment Recommendations  3 in 1 bedside commode    Recommendations for Other Services Rehab consult;Other (comment)(Neuro consult)     Precautions / Restrictions Precautions Precautions: Fall Restrictions Weight Bearing Restrictions: No      Mobility Bed Mobility Overal bed mobility: Needs Assistance Bed Mobility: Supine to Sit;Sit to Supine;Rolling     Supine to sit: Min assist;HOB elevated        Transfers Overall transfer level: Needs assistance Equipment used: 2 person hand held assist Transfers: Sit to/from [employer];Stand Pivot Transfers Sit to Stand: Min assist;+2 physical assistance Stand pivot transfers: Mod assist;+2 physical assistance       General transfer comment: posterior bias; difficulty with problem solving during transfer    Balance Overall balance assessment: Needs assistance   Sitting balance-Leahy Scale: Fair       Standing balance-Leahy Scale: Poor Standing balance comment: posterior bias                           ADL either performed or assessed with clinical judgement   ADL Overall ADL's : Needs assistance/impaired Eating/Feeding: Moderate assistance Eating/Feeding Details (indicate cue type and reason): difficulty managing secretions; modified diet Grooming: Minimal assistance;Sitting Grooming Details (indicate cue type and reason): difficulty with coordination and sequening during task Upper Body Bathing: Minimal assistance;Sitting   Lower Body Bathing: Moderate assistance;Sit to/from stand   Upper Body Dressing : Moderate assistance;Sitting   Lower Body Dressing: Moderate assistance;Sit to/from stand   Toilet Transfer: Minimal assistance;+2 for physical assistance   Toileting- Clothing Manipulation and Hygiene: Moderate assistance;Sit to/from stand       Functional mobility during ADLs: Moderate assistance;+2 for physical assistance;Cueing for safety;Cueing for sequencing General ADL Comments: Difficulty with attention and sequencing noted during ADL tasks     Vision Baseline Vision/History: Wears glasses Additional Comments: Complains of blurred vision but may be due to pt needing glasses; unable to get clear picture of when she wears her glasses; note decreased visual attention(will further assess)     Perception Perception Spatial deficits: difficulty with organizationof self in space during trnasfer; cues to use LUE; ? L inattention; identifies targets during double simultaneous stimulation   Praxis Praxis Praxis tested?: Deficits Deficits: Motor Impersistence;Organization;Initiation    Pertinent Vitals/Pain Pain Assessment: Faces Faces Pain Scale: Hurts a little bit Pain Location: bottom Pain Descriptors / Indicators: Grimacing Pain Intervention(s): Limited activity within patient's tolerance     Hand Dominance Right   Extremity/Trunk Assessment Upper Extremity Assessment Upper Extremity Assessment: Generalized weakness;LUE deficits/detail LUE  Deficits /  Details: Appears to require cues to use LUE at times during funcitonal activities; decreased coordination with LUE LUE Coordination: decreased fine motor   Lower Extremity Assessment Lower Extremity Assessment: Defer to PT evaluation   Cervical / Trunk Assessment Cervical / Trunk Assessment: Kyphotic;Other exceptions Cervical / Trunk Exceptions: Foward head; posterior bias   Communication Communication Communication: Expressive difficulties(difficult to understand, not moving mouth to form words, soft)   Cognition Arousal/Alertness: Lethargic Behavior During Therapy: Flat affect Overall Cognitive Status: Impaired/Different from baseline Area of Impairment: Attention;Following commands;Problem solving;Safety/judgement;Awareness                   Current Attention Level: Sustained   Following Commands: Follows one step commands with increased time Safety/Judgement: Decreased awareness of safety;Decreased awareness of deficits Awareness: Emergent Problem Solving: Requires verbal cues;Slow processing;Decreased initiation;Requires tactile cues;Difficulty sequencing General Comments: Delayed processing; required cues to use her LUE during functional activity; Difficulty turning to the L and problenm solving how to turn to sit in her recliner; difficulty problem solving how to put the cap on the toothpaste   General Comments       Exercises     Shoulder Instructions      Home Living Family/patient expects to be discharged to:: Private residence Living Arrangements: Parent;Spouse/significant other(Mom; grandma and boyfriend) Available Help at Discharge: Family;Available PRN/intermittently Type of Home: House Home Access: Stairs to enter [employer] of Steps: 1   Home Layout: One level     Bathroom Shower/Tub: Chief Strategy Officer: Standard     Home Equipment: None          Prior Functioning/Environment Level of Independence:  Independent        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        OT Problem List: Decreased strength;Decreased activity tolerance;Impaired balance (sitting and/or standing);Impaired vision/perception;Decreased coordination;Decreased cognition;Decreased safety awareness;Decreased knowledge of use of DME or AE;Cardiopulmonary status limiting activity      OT Treatment/Interventions: Self-care/ADL training;Therapeutic exercise;Neuromuscular education;DME and/or AE instruction;Therapeutic activities;Cognitive remediation/compensation;Visual/perceptual remediation/compensation;Patient/family education;Balance training    OT Goals(Current goals can be found in the care plan section) Acute Rehab OT Goals Patient Stated Goal: to eat OT Goal Formulation: With patient Time For Goal Achievement: 11/10/17 Potential to Achieve Goals: Good  OT Frequency: Min 3X/week   Barriers to D/C:            Co-evaluation PT/OT/SLP Co-Evaluation/Treatment: Yes Reason for Co-Treatment: Complexity of the patient's impairments (multi-system involvement);Necessary to address cognition/behavior during functional activity;To address functional/ADL transfers   OT goals addressed during session: ADL's and self-care      AM-PAC PT "6 Clicks" Daily Activity     Outcome Measure Help from another person eating meals?: A Lot Help from another person taking care of personal grooming?: A Little Help from another person toileting, which includes using toliet, bedpan, or urinal?: A Lot Help from another person bathing (including washing, rinsing, drying)?: A Lot Help from another person to put on and taking off regular upper body clothing?: A Little Help from another person to put on and taking off regular lower body clothing?: A Lot 6 Click Score: 14   End of Session Equipment Utilized During Treatment: Gait belt Nurse Communication: Mobility status;Other (comment)(recommend neuro consult/brain  imaging)  Activity Tolerance: Patient tolerated treatment well Patient left: in chair;with call bell/phone within reach;with chair alarm set  OT Visit Diagnosis: Other abnormalities of gait and mobility (R26.89);Muscle weakness (generalized) (M62.81);Other symptoms and signs involving cognitive  function                Time: 5409-8119 OT Time Calculation (min): 34 min Charges:  OT General Charges $OT Visit: 1 Visit OT Evaluation $OT Eval Moderate Complexity: 1 Mod  Vicie Cech, OT/L  OT Clinical Specialist [phone]   [facility] 10/27/2017, 10:29 AM

## 2017-10-27 NOTE — Progress Notes (Signed)
Physical Therapy Treatment Patient Details Name: Stephanie SchneidersJuliana Young MRN: [id number] DOB: [birth year] Today's Date: 10/27/2017    History of Present Illness Pt is a 27 y.o. female admitted 10/13/17 with coughing, SOB and chest pain. 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 ETT 8/11-8/19. No significant PMH on file.    PT Comments    Although pt is making progress towards her goals she continues to be limited by decreased cognition, in particular problem solving and awareness and although weakness is improving she continues to have difficulty in balance and coordination to perform functional mobility. Pt currently requires min A for bed mobility, min Ax2  for sit>stand to steady and correct posterior lean and requires mod Ax 2 for [facility] due to decreased problem solving in completing turn. Pt attending contacted regarding possible neuro consult for increased deficits for a pt of her age. [facility] level rehab continues to be appropriate once medically stable. PT will continue to follow acutely.     Follow Up Recommendations  CIR;Supervision/Assistance - 24 hour     Equipment Recommendations  Other (comment)(TBD)    Recommendations for Other Services       Precautions / Restrictions Precautions Precautions: Fall Restrictions Weight Bearing Restrictions: No    Mobility  Bed Mobility Overal bed mobility: Needs Assistance Bed Mobility: Supine to Sit;Sit to Supine;Rolling     Supine to sit: Min assist;HOB elevated        Transfers Overall transfer level: Needs assistance Equipment used: 2 person hand held assist Transfers: Sit to/from [employer];Stand Pivot Transfers Sit to Stand: Min assist;+2 physical assistance Stand pivot transfers: Mod assist;+2 physical assistance       General transfer comment: posterior bias; difficulty with problem solving during transfer, decreased coordination with movement of L foot even with vc                  Balance Overall balance assessment: Needs assistance   Sitting balance-Leahy Scale: Fair       Standing balance-Leahy Scale: Poor Standing balance comment: posterior bias                            Cognition Arousal/Alertness: Lethargic Behavior During Therapy: Flat affect Overall Cognitive Status: Impaired/Different from baseline Area of Impairment: Attention;Following commands;Problem solving;Safety/judgement;Awareness                   Current Attention Level: Sustained   Following Commands: Follows one step commands with increased time Safety/Judgement: Decreased awareness of safety;Decreased awareness of deficits Awareness: Emergent Problem Solving: Requires verbal cues;Slow processing;Decreased initiation;Requires tactile cues;Difficulty sequencing General Comments: Delayed processing; required cues to use her LUE during functional activity; Difficulty turning to the L and problenm solving how to turn to sit in her recliner; difficulty problem solving how to put the cap on the toothpaste         General Comments General comments (skin integrity, edema, etc.): VSS, still hypophonic, although more responsive to questioning      Pertinent Vitals/Pain Pain Assessment: Faces Faces Pain Scale: Hurts a little bit Pain Location: bottom Pain Descriptors / Indicators: Grimacing Pain Intervention(s): Limited activity within patient's tolerance;Monitored during session;Repositioned    Home Living Family/patient expects to be discharged to:: Private residence Living Arrangements: Parent;Spouse/significant other(Mom; grandma and boyfriend) Available Help at Discharge: Family;Available PRN/intermittently Type of Home: House Home Access: Stairs to enter   Home Layout: One level Home Equipment: None  Prior Function Level of Independence: Independent      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   PT Goals (current goals can now be  found in the care plan section) Acute Rehab PT Goals Patient Stated Goal: to eat PT Goal Formulation: With patient Time For Goal Achievement: 11/08/17 Potential to Achieve Goals: Good Progress towards PT goals: Progressing toward goals    Frequency    Min 3X/week      PT Plan Current plan remains appropriate    Co-evaluation PT/OT/SLP Co-Evaluation/Treatment: Yes Reason for Co-Treatment: Complexity of the patient's impairments (multi-system involvement) PT goals addressed during session: Mobility/safety with mobility OT goals addressed during session: ADL's and self-care      AM-PAC PT "6 Clicks" Daily Activity  Outcome Measure  Difficulty turning over in bed (including adjusting bedclothes, sheets and blankets)?: A Lot Difficulty moving from lying on back to sitting on the side of the bed? : Unable Difficulty sitting down on and standing up from a chair with arms (e.g., wheelchair, bedside commode, etc,.)?: Unable Help needed moving to and from a bed to chair (including a wheelchair)?: A Lot Help needed walking in hospital room?: A Lot Help needed climbing 3-5 steps with a railing? : Total 6 Click Score: 9    End of Session Equipment Utilized During Treatment: Gait belt Activity Tolerance: Patient limited by fatigue Patient left: in chair;with call bell/phone within reach;with chair alarm set Nurse Communication: Mobility status;Other (comment)(due to decreased awareness toilet every 2 hrs) PT Visit Diagnosis: Other abnormalities of gait and mobility (R26.89);Muscle weakness (generalized) (M62.81)     Time: 1610-9604 PT Time Calculation (min) (ACUTE ONLY): 35 min  Charges:  $Therapeutic Activity: 8-22 mins                     Stephanie Young B. Beverely Risen PT, DPT Acute Rehabilitation  [phone] Pager [phone]     Stephanie Young 10/27/2017, 10:55 AM

## 2017-10-27 NOTE — Progress Notes (Signed)
Stephanie MountSylvester Evita Merida, MD  [facility] Pager #: [phone] 7PM-7AM contact night coverage as above  PROGRESS NOTE    Stephanie Young  UJW:[id number] DOB: 1990/07/22 DOA: 10/12/2017 PCP: Claiborne RiggFleming, [name], NP  Outpatient Specialists:     Brief Narrative: Patient is a 27 year old female with no significant PMH.  Patient was admitted on 13 October 2017 with cough, SOB and CP, diagnosed with ARDS with negative blood and respiratory cultures.  10/26/2017: Patient is new to me.  Patient was seen alongside patient's mother.  Patient's mother was communicated to via the language translating equipment.  All questions were answered.  Also discussed with the patient's nurse and Dr. Molli KnockYacoub.  Patient's nurse seems worried about the patient.  We will continue to reevaluate the patient.  We will proceed with CT scan of the chest without contrast, check ammonia level, ABG and lactic acid level.  Will restart patient on antibiotics for now.  Hyper natremia is noted, as well as hypokalemia.  Further management will depend on hospital course.  10/27/2017: Patient seen alongside patient's nurse.  Patient continues to improve.  Patient is awake, alert and oriented to time, place and person.  Patient moves all limbs.  Patient remembers seeing me yesterday.  Sore throat has improved significantly.  Drooling has improved significantly.  Muscle power has improved significantly.  Hypernatremia is improving.  His sodium is down to 146.  Hypokalemia is noted.  Will replete potassium.  Will continue cardiac management.  Will change IV fluids to D5 water with 20 of KCl to each liter at the rate of 75 cc an hour.  Continue to monitor patient's renal function, electrolytes and CBC.  Leukocytosis is resolving.  Thrombocytosis is resolving.  Overall, patient seems to be moving in the right direction.   Assessment & Plan:   Principal Problem:   Multifocal pneumonia Active Problems:   Hypokalemia   Sepsis (HCC)   Lobar  pneumonia (HCC)   Acute respiratory failure with hypoxia (HCC)   ARDS (adult respiratory distress syndrome) (HCC)   Tachypnea   Hyponatremia   Leukocytosis   Acute blood loss anemia  Multifocal pneumonia: CT scan of the chest without contrast. Check procalcitonin level. Continue IV antibiotics.  However, calcitonin level is less than 0.1. Complete course of antibiotics. Aspiration precautions.  ARDS/acute respiratory failure: Pulmonary input is highly appreciated. This is thought to be secondary to VAPE Repeat ABG done on 10/26/2017 noted.  Aspiration risk: Patient continues to drool. Patient continues to report sore throat Consider discussed lidocaine for numbing effect if okay with speech therapy team. Continue to monitor mental status.  Check ammonia level. 10/27/2017: Drooling has improved significantly.  Patient is able suction self.   Hypernatremia: Fluid deficit is approximately 3.12 L. Cautiously hydrate with D5 water. Change to free water when feasible. Check BMP every 4 hours. Check urine sodium.  Suspect this is all secondary to poor p.o. Intake. 10/27/2017: Sodium is down to 146.  We decreased the rate of IV fluid to 75 cc an hour.  We will also change D5 water to D5 water with 20 of KCl to each liter. Continue to monitor renal function and electrolytes.  Hypokalemia: Repleted. Repeat potassium level is 3.9. 10/27/2017: Potassium level was 3.2 today.  Will replete.  Mild hypophosphatemic: Will replete.  Low albumin noted.  This may be of prognostic significance.  Guarded prognosis.   Nutrition: Option includes via NG tube. Will consult dietary team.  Further management will depend on hospital course.  DVT prophylaxis: SCD Code Status: Full Family Communication: Mother Disposition Plan: Will depend on hospital course   Consultants:   Dietary team  Procedures:   None  Antimicrobials:   Restart IV Cefepime    Subjective: No significant  history from patient.  Objective: Vitals:   10/27/17 0800 10/27/17 0900 10/27/17 1000 10/27/17 1100  BP: 126/75 134/83 136/81 133/74  Pulse: (!) 56 (!) 56 61 (!) 56  Resp: 19 (!) 23 17 18   Temp:      TempSrc:      SpO2: 99% 99% 99% 99%  Weight:      Height:        Intake/Output Summary (Last 24 hours) at 10/27/2017 1119 Last data filed at 10/27/2017 1000 Gross per 24 hour  Intake 1826.67 ml  Output 1180 ml  Net 646.67 ml   Filed Weights   10/20/17 0600 10/24/17 0500 10/26/17 0500  Weight: 76.2 kg 70.2 kg 67.1 kg    Examination:  General exam: Not in any distress.  Patient looks a lot better today.    Respiratory system: Clear to auscultation. Cardiovascular system: S1 & S2 heard. Gastrointestinal system: Abdomen is nondistended, soft and nontender. No organomegaly or masses felt. Normal bowel sounds heard. Central nervous system: Awake and alert. Weak, but moves all limbs. Extremities: No leg edema.  Data Reviewed: I have personally reviewed following labs and imaging studies  CBC: Recent Labs  Lab 10/22/17 0312 10/23/17 0213 10/24/17 0439 10/25/17 0320 10/26/17 0455 10/27/17 0900  WBC 11.3* 12.4* 14.8* 16.8* 14.0* 12.1*  NEUTROABS 8.1* 8.6* 10.7* 14.8*  --  8.7*  HGB 9.8* 9.6* 10.7* 11.3* 10.4* 10.1*  HCT 32.8* 31.3* 35.5* 37.0 33.8* 34.0*  MCV 90.9 88.7 91.3 88.3 90.1 91.9  PLT 562* 574* 669* 785* 719* 658*   Basic Metabolic Panel: Recent Labs  Lab 10/23/17 0213  10/24/17 0439 10/25/17 0320 10/26/17 0455 10/26/17 0740 10/26/17 1511 10/26/17 2132 10/27/17 0831  NA 144   < >  --  149* 152* 153* 152* 146* 146*  K 3.9   < >  --  3.2* 3.3* 3.9 3.6 3.9 3.2*  CL 104   < >  --  104 117* 117* 117* 110 114*  CO2 29   < >  --  29 29 29 29 27 25   GLUCOSE 102*   < >  --  130* 130* 132* 157* 335* 141*  BUN 33*   < >  --  30* 35* 34* 34* 28* 23*  CREATININE 0.49   < >  --  0.48 0.58 0.51 0.52 0.57 0.55  CALCIUM 8.6*   < >  --  9.3 8.8* 9.1 9.1 8.8* 9.1  MG  2.2  --  2.3 2.3 2.5*  --  2.5*  --  2.2  PHOS 3.4  --  5.3* 3.1 4.2  --   --   --  3.5   < > = values in this interval not displayed.   GFR: Estimated Creatinine Clearance: 95.9 mL/min (by C-G formula based on SCr of 0.55 mg/dL). Liver Function Tests: Recent Labs  Lab 10/27/17 0831  ALBUMIN 2.7*   No results for input(s): LIPASE, AMYLASE in the last 168 hours. Recent Labs  Lab 10/26/17 1511  AMMONIA 26   Coagulation Profile: No results for input(s): INR, PROTIME in the last 168 hours. Cardiac Enzymes: Recent Labs  Lab 10/24/17 0439 10/26/17 0740  CKTOTAL 61 87  CKMB 0.6  --    BNP (last 3  results) No results for input(s): PROBNP in the last 8760 hours. HbA1C: No results for input(s): HGBA1C in the last 72 hours. CBG: Recent Labs  Lab 10/26/17 1126 10/26/17 1517 10/26/17 2010 10/26/17 2301 10/27/17 0746  GLUCAP 118* 126* 117* 128* 117*   Lipid Profile: No results for input(s): CHOL, HDL, LDLCALC, TRIG, CHOLHDL, LDLDIRECT in the last 72 hours. Thyroid Function Tests: No results for input(s): TSH, T4TOTAL, FREET4, T3FREE, THYROIDAB in the last 72 hours. Anemia Panel: No results for input(s): VITAMINB12, FOLATE, FERRITIN, TIBC, IRON, RETICCTPCT in the last 72 hours. Urine analysis:    Component Value Date/Time   COLORURINE YELLOW 10/25/2017 0852   APPEARANCEUR CLOUDY (A) 10/25/2017 0852   LABSPEC 1.025 10/25/2017 0852   PHURINE 5.0 10/25/2017 0852   GLUCOSEU NEGATIVE 10/25/2017 0852   HGBUR MODERATE (A) 10/25/2017 0852   BILIRUBINUR NEGATIVE 10/25/2017 0852   BILIRUBINUR small 02/25/2017 1428   KETONESUR 20 (A) 10/25/2017 0852   PROTEINUR 30 (A) 10/25/2017 0852   UROBILINOGEN 0.2 02/25/2017 1428   NITRITE NEGATIVE 10/25/2017 0852   LEUKOCYTESUR NEGATIVE 10/25/2017 0852   Sepsis Labs: @LABRCNTIP (procalcitonin:4,lacticidven:4)  ) Recent Results (from the past 240 hour(s))  Culture, respiratory (non-expectorated)     Status: None   Collection Time:  10/19/17  1:27 PM  Result Value Ref Range Status   Specimen Description TRACHEAL ASPIRATE  Final   Special Requests NONE  Final   Gram Stain   Final    RARE WBC PRESENT,BOTH PMN AND MONONUCLEAR NO ORGANISMS SEEN    Culture   Final    NO GROWTH 2 DAYS Performed at [facility], [address], [place], [place] [address]    Report Status 10/21/2017 FINAL  Final  Culture, Urine     Status: None   Collection Time: 10/25/17 10:47 AM  Result Value Ref Range Status   Specimen Description URINE, CATHETERIZED  Final   Special Requests NONE  Final   Culture   Final    NO GROWTH Performed at [facility], [address] [place], [place] [address]    Report Status 10/26/2017 FINAL  Final         Radiology Studies: Ct Chest Wo Contrast  Result Date: 10/26/2017 CLINICAL DATA:  Inpatient.  Cough and dyspnea.  ARDS. EXAM: CT CHEST WITHOUT CONTRAST TECHNIQUE: Multidetector CT imaging of the chest was performed following the standard protocol without IV contrast. COMPARISON:  10/24/2017 chest radiograph.  10/12/2017 chest CT. FINDINGS: Cardiovascular: Normal heart size. No significant pericardial effusion/thickening. Right PICC terminates at the cavoatrial junction. Great vessels are normal in course and caliber. Mediastinum/Nodes: No discrete thyroid nodules. Unremarkable esophagus. No pathologically enlarged axillary, mediastinal or hilar lymph nodes, noting limited sensitivity for the detection of hilar adenopathy on this noncontrast study. Lungs/Pleura: No pneumothorax. No pleural effusion. There is patchy peribronchovascular consolidation and ground-glass opacity relatively symmetrically involving both lungs, most prominent in the upper lobes, with relative sparing of the peripheral/subpleural lungs. The lower lobe opacities on the 10/12/2017 chest CT have largely resolved. The upper lobe opacities on today's chest CT are more central in distribution compared to the prior chest CT. No  discrete lung masses. Subpleural anterior left lower lobe 5 mm solid pulmonary nodule (series 5/image 61), stable, probably benign. No new significant pulmonary nodules. No significant regions of bronchiectasis, architectural distortion or frank honeycombing. Upper abdomen: No acute abnormality. Musculoskeletal:  No aggressive appearing focal osseous lesions. IMPRESSION: Persistent patchy peribronchovascular consolidation and ground-glass opacity symmetrically involving both  lungs, most prominent in the upper lobes, with relative sparing of the peripheral/subpleural lungs. Lung opacities are in a different distribution compared to the 10/12/2017 chest CT. Favor evolution of acute interstitial pneumonia/ARDS. Differential includes diffuse alveolar hemorrhage such as from vasculitis, drug toxicity or atypical infection. Electronically Signed   By: Delbert Phenix M.D.   On: 10/26/2017 14:31   Dg Swallowing Func-speech Pathology  Result Date: 10/26/2017 Objective Swallowing Evaluation: Type of Study: MBS-Modified Barium Swallow Study  Patient Details Name: Stephanie Young MRN: [id number] Date of Birth: 16-Jun-1990 Today's Date: 10/26/2017 Time: SLP Start Time (ACUTE ONLY): 1409 -SLP Stop Time (ACUTE ONLY): 1439 SLP Time Calculation (min) (ACUTE ONLY): 30 min Past Medical History: Past Medical History: Diagnosis Date . H. pylori infection  . Kidney stones  Past Surgical History: Past Surgical History: Procedure Laterality Date . dental procedure   HPI: Pt is a 27 yo female with no significant PMH presenting 8/8 with cough, SOB and CP. 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  Subjective: pt alert, tearful, seems anxious Assessment / Plan / Recommendation CHL IP CLINICAL IMPRESSIONS 10/26/2017 Clinical Impression Pt has a moderate-severe oropharyngeal dysphagia due to generalized weakness and oral deficits that do not appear consistent with a typical post-extbation dysphagia as well  as suspected sensory changes and incomplete airway closure that is more common after a prolonged intubation. She needs Mod cues for oral opening for adequate bolus acceptance, but incomplete labial seals also results in anterior spillage throughout the study of liquids and saliva. Her posterior propulsion is slow with intermittent tongue pumping. There is residue on the tongue and in the valleculae with all consistencies, which is reduced with spontaneous second swallow. Her hyolaryngeal movement is reduced, as is her epiglottic deflection, base of tongue retraction, and pharyngeal squeeze. Airway compromise occurs during the swallow with nectar and honey thick liquids. Aspiration is silent, and she cannot produce a strong volitional cough for clearance. A chin tuck helps to improve airway protection with honey thick liquids and reduced vallecular residue with purees. UES relaxation appears reduced with intermittent backflow observed inthe cervical esophagus. One instance of backflow into the larynx occured after she swallowed her first bite of puree, but this did trigger a sensed cough that was productive of clearing aspirates. Given her weak cough, overall fatigue, and poor secretion management, would favor starting more conservatively. Instead of full meal trays, would allow her to have snacks that consist of small bites of puree and small sips of honey thick liquids. Full supervision should be used, and pt should tuck her chin with each swallow. SLP will continue to follow for potential to advance.  SLP Visit Diagnosis Dysphagia, oropharyngeal phase (R13.12) Attention and concentration deficit following -- Frontal lobe and executive function deficit following -- Impact on safety and function Moderate aspiration risk;Severe aspiration risk   CHL IP TREATMENT RECOMMENDATION 10/26/2017 Treatment Recommendations Therapy as outlined in treatment plan below   Prognosis 10/26/2017 Prognosis for Safe Diet Advancement Good  Barriers to Reach Goals Severity of deficits Barriers/Prognosis Comment -- CHL IP DIET RECOMMENDATION 10/26/2017 SLP Diet Recommendations Dysphagia 1 (Puree) solids;Honey thick liquids Liquid Administration via Cup;Spoon;No straw Medication Administration Via alternative means Compensations Slow rate;Small sips/bites;Monitor for anterior loss;Multiple dry swallows after each bite/sip;Chin tuck Postural Changes Remain semi-upright after after feeds/meals (Comment);Seated upright at 90 degrees   CHL IP OTHER RECOMMENDATIONS 10/26/2017 Recommended Consults -- Oral Care Recommendations Oral care QID Other Recommendations Order thickener from pharmacy;Prohibited food (jello,  ice cream, thin soups);Have oral suction available;Remove water pitcher   CHL IP FOLLOW UP RECOMMENDATIONS 10/26/2017 Follow up Recommendations Inpatient Rehab   CHL IP FREQUENCY AND DURATION 10/26/2017 Speech Therapy Frequency (ACUTE ONLY) min 2x/week Treatment Duration 2 weeks      CHL IP ORAL PHASE 10/26/2017 Oral Phase Impaired Oral - Pudding Teaspoon -- Oral - Pudding Cup -- Oral - Honey Teaspoon Left anterior bolus loss;Right anterior bolus loss;Weak lingual manipulation;Reduced posterior propulsion;Lingual/palatal residue;Delayed oral transit;Decreased bolus cohesion Oral - Honey Cup Left anterior bolus loss;Right anterior bolus loss;Weak lingual manipulation;Reduced posterior propulsion;Lingual/palatal residue;Delayed oral transit;Decreased bolus cohesion Oral - Nectar Teaspoon Left anterior bolus loss;Right anterior bolus loss;Weak lingual manipulation;Reduced posterior propulsion;Lingual/palatal residue;Delayed oral transit;Decreased bolus cohesion Oral - Nectar Cup -- Oral - Nectar Straw -- Oral - Thin Teaspoon -- Oral - Thin Cup -- Oral - Thin Straw -- Oral - Puree Left anterior bolus loss;Right anterior bolus loss;Weak lingual manipulation;Reduced posterior propulsion;Lingual/palatal residue;Delayed oral transit;Decreased bolus cohesion Oral  - Mech Soft -- Oral - Regular -- Oral - Multi-Consistency -- Oral - Pill -- Oral Phase - Comment --  CHL IP PHARYNGEAL PHASE 10/26/2017 Pharyngeal Phase Impaired Pharyngeal- Pudding Teaspoon -- Pharyngeal -- Pharyngeal- Pudding Cup -- Pharyngeal -- Pharyngeal- Honey Teaspoon Reduced pharyngeal peristalsis;Reduced epiglottic inversion;Reduced anterior laryngeal mobility;Reduced laryngeal elevation;Reduced airway/laryngeal closure;Pharyngeal residue - valleculae;Penetration/Aspiration during swallow;Compensatory strategies attempted (with notebox) Pharyngeal Material enters airway, remains ABOVE vocal cords and not ejected out Pharyngeal- Honey Cup Reduced pharyngeal peristalsis;Reduced epiglottic inversion;Reduced anterior laryngeal mobility;Reduced laryngeal elevation;Reduced airway/laryngeal closure;Pharyngeal residue - valleculae;Compensatory strategies attempted (with notebox);Penetration/Aspiration during swallow Pharyngeal Material enters airway, remains ABOVE vocal cords then ejected out Pharyngeal- Nectar Teaspoon Reduced pharyngeal peristalsis;Reduced epiglottic inversion;Reduced anterior laryngeal mobility;Reduced laryngeal elevation;Reduced airway/laryngeal closure;Pharyngeal residue - valleculae;Penetration/Aspiration during swallow;Pharyngeal residue - pyriform;Compensatory strategies attempted (with notebox) Pharyngeal Material enters airway, passes BELOW cords without attempt by patient to eject out (silent aspiration) Pharyngeal- Nectar Cup -- Pharyngeal -- Pharyngeal- Nectar Straw -- Pharyngeal -- Pharyngeal- Thin Teaspoon -- Pharyngeal -- Pharyngeal- Thin Cup -- Pharyngeal -- Pharyngeal- Thin Straw -- Pharyngeal -- Pharyngeal- Puree Reduced pharyngeal peristalsis;Reduced epiglottic inversion;Reduced anterior laryngeal mobility;Reduced laryngeal elevation;Reduced airway/laryngeal closure;Pharyngeal residue - valleculae;Penetration/Apiration after swallow Pharyngeal Material enters airway, passes BELOW  cords then ejected out Pharyngeal- Mechanical Soft -- Pharyngeal -- Pharyngeal- Regular -- Pharyngeal -- Pharyngeal- Multi-consistency -- Pharyngeal -- Pharyngeal- Pill -- Pharyngeal -- Pharyngeal Comment --  CHL IP CERVICAL ESOPHAGEAL PHASE 10/26/2017 Cervical Esophageal Phase Impaired Pudding Teaspoon -- Pudding Cup -- Honey Teaspoon Reduced cricopharyngeal relaxation Honey Cup Reduced cricopharyngeal relaxation;Esophageal backflow into cervical esophagus Nectar Teaspoon Reduced cricopharyngeal relaxation;Esophageal backflow into cervical esophagus Nectar Cup -- Nectar Straw -- Thin Teaspoon -- Thin Cup -- Thin Straw -- Puree Reduced cricopharyngeal relaxation;Esophageal backflow into the larynx Mechanical Soft -- Regular -- Multi-consistency -- Pill -- Cervical Esophageal Comment -- Maxcine Ham 10/26/2017, 3:48 PM  Maxcine Ham, M.A. CCC-SLP [phone]                  Scheduled Meds: . chlorhexidine  15 mL Mouth Rinse BID  . Chlorhexidine Gluconate Cloth  6 each Topical Daily  . enoxaparin (LOVENOX) injection  40 mg Subcutaneous Daily  . insulin aspart  0-9 Units Subcutaneous TID WC  . mouth rinse  15 mL Mouth Rinse q12n4p  . multivitamin with minerals  1 tablet Oral Daily  . sodium chloride flush  10-40 mL Intracatheter Q12H   Continuous Infusions: . sodium chloride Stopped (10/23/17 0551)  . ceFEPime (MAXIPIME) IV 2  g (10/27/17 0504)  . dextrose 5 % with KCl 20 mEq / L    . potassium chloride       LOS: 13 days    Time spent: 36 minutes Minutes    Stephanie Mount, MD  [facility] Pager #: [phone] 7PM-7AM contact night coverage as above

## 2017-10-28 LAB — GLUCOSE, CAPILLARY
GLUCOSE-CAPILLARY: 91 mg/dL (ref 70–99)
GLUCOSE-CAPILLARY: 96 mg/dL (ref 70–99)
Glucose-Capillary: 102 mg/dL — ABNORMAL HIGH (ref 70–99)
Glucose-Capillary: 110 mg/dL — ABNORMAL HIGH (ref 70–99)
Glucose-Capillary: 111 mg/dL — ABNORMAL HIGH (ref 70–99)

## 2017-10-28 MED ORDER — DIPHENHYDRAMINE HCL 25 MG PO CAPS
25.0000 mg | ORAL_CAPSULE | Freq: Four times a day (QID) | ORAL | Status: DC | PRN
  Administered 2017-10-28: 25 mg via ORAL
  Filled 2017-10-28: qty 1

## 2017-10-28 NOTE — Progress Notes (Signed)
  Speech Language Pathology Treatment: Dysphagia  Patient Details Name: Stephanie SchneidersJuliana Young MRN: [id number] DOB: 1990-05-28 Today's Date: 10/28/2017 Time: [id number] SLP Time Calculation (min) (ACUTE ONLY): 26 min  Assessment / Plan / Recommendation Clinical Impression  Dysphagia intervention with goal of po recommendation to receive meal trays 3 x/day. Improvements seen today compared to prior notes in re: to saliva management and overall affect/awareness. She was not pooling saliva in oral cavity during session. Throat clears, prior to po's resulted in productive mucous which pt expectorated. Encouraged her, if she feels saliva collecting in oral cavity to swallow but continue to expectorate if has productive cough or throat clear. Min verbal and visual cues to tuck chin tighter with honey thick juice. Mastication of solid trials was somewhat slow and deliberate. Thin water with chin tuck assessed for possible initiation of free water protocol. Immediate and forceful reflexive cough reflex indicating probable penetration/aspiration. SLP ordered Dys 2 texture, continue honey thick liquids with chin tuck during all swallows and crush pills. ST will continue to follow for safety with recommendations.   HPI HPI: Pt is a 27 yo female with no significant PMH presenting 8/8 with cough, SOB and CP. 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      SLP Plan  Continue with current plan of care       Recommendations  Diet recommendations: Dysphagia 2 (fine chop);Honey-thick liquid Liquids provided via: Cup Medication Administration: Crushed with puree Supervision: Patient able to self feed;Intermittent supervision to cue for compensatory strategies Compensations: Slow rate;Small sips/bites;Lingual sweep for clearance of pocketing Postural Changes and/or Swallow Maneuvers: Seated upright 90 degrees;Upright 30-60 min after meal                Oral Care  Recommendations: Oral care BID Follow up Recommendations: Inpatient Rehab SLP Visit Diagnosis: Dysphagia, oropharyngeal phase (R13.12) Plan: Continue with current plan of care       GO                Royce MacadamiaLitaker, Decorian Schuenemann Willis 10/28/2017, 5:06 PM  Breck CoonsLisa Willis Lonell FaceLitaker M.Ed [employer]-SLP Pager [phone]

## 2017-10-28 NOTE — Progress Notes (Signed)
Berton Mount, MD  [facility] Pager #: [phone] 7PM-7AM contact night coverage as above  PROGRESS NOTE    Stephanie Young  UJW:[id number] DOB: [birth year] DOA: 10/12/2017 PCP: Claiborne Rigg, NP  Outpatient Specialists:     Brief Narrative: Patient is a 27 year old female with no significant PMH.  Patient was admitted on 13 October 2017 with cough, SOB and CP, diagnosed with ARDS with negative blood and respiratory cultures.  10/26/2017: Patient is new to me.  Patient was seen alongside patient's mother.  Patient's mother was communicated to via the language translating equipment.  All questions were answered.  Also discussed with the patient's nurse and Dr. Molli Knock.  Patient's nurse seems worried about the patient.  We will continue to reevaluate the patient.  We will proceed with CT scan of the chest without contrast, check ammonia level, ABG and lactic acid level.  Will restart patient on antibiotics for now.  Hyper natremia is noted, as well as hypokalemia.  Further management will depend on hospital course.  10/27/2017: Patient seen alongside patient's nurse.  Patient continues to improve.  Patient is awake, alert and oriented to time, place and person.  Patient moves all limbs.  Patient remembers seeing me yesterday.  Sore throat has improved significantly.  Drooling has improved significantly.  Muscle power has improved significantly.  Hypernatremia is improving.  His sodium is down to 146.  Hypokalemia is noted.  Will replete potassium.  Will continue cardiac management.  Will change IV fluids to D5 water with 20 of KCl to each liter at the rate of 75 cc an hour.  Continue to monitor patient's renal function, electrolytes and CBC.  Leukocytosis is resolving.  Thrombocytosis is resolving.  Overall, patient seems to be moving in the right direction.  10/28/2017: Patient continues to improve.  Speech therapy input is highly appreciated.  For dysphagia 2 diet and thickened liquids  only.   Assessment & Plan:   Principal Problem:   Multifocal pneumonia Active Problems:   Hypokalemia   Sepsis (HCC)   Lobar pneumonia (HCC)   Acute respiratory failure with hypoxia (HCC)   ARDS (adult respiratory distress syndrome) (HCC)   Tachypnea   Hyponatremia   Leukocytosis   Acute blood loss anemia  Multifocal pneumonia: CT scan of the chest without contrast. Check procalcitonin level. Continue IV antibiotics.  However, calcitonin level is less than 0.1. Complete course of antibiotics. Aspiration precautions. 10/28/2017: Complete course of antibiotics with IV cefepime.  Likely DC IV cefepime after tomorrow's dose.  ARDS/acute respiratory failure: Pulmonary input is highly appreciated. This is thought to be secondary to VAPE Repeat ABG done on 10/26/2017 noted. 10/28/2017: Resolved.  Aspiration risk: Patient continues to drool. Patient continues to report sore throat Consider discussed lidocaine for numbing effect if okay with speech therapy team. Continue to monitor mental status.  Check ammonia level. 10/27/2017: Drooling has improved significantly.  Patient is able suction self. 10/28/2017: Kindly see above.  Dysphagia 2 diet with thickened liquid.  Hypernatremia: Fluid deficit is approximately 3.12 L. Cautiously hydrate with D5 water. Change to free water when feasible. Check BMP every 4 hours. Check urine sodium.  Suspect this is all secondary to poor p.o. Intake. 10/27/2017: Sodium is down to 146.  We decreased the rate of IV fluid to 75 cc an hour.  We will also change D5 water to D5 water with 20 of KCl to each liter. Continue to monitor renal function and electrolytes. 10/28/2017: Repeat renal panel in the morning.  Hypokalemia: Repleted. Repeat potassium level is 3.9. 10/27/2017: Potassium level was 3.2 today.  Will replete. 10/28/2017: Repeat renal panel in the morning  Mild hypophosphatemic: Continue to monitor and replete.    Nutrition: Dysphagia  2 diet.  Further management will depend on hospital course.   DVT prophylaxis: SCD Code Status: Full Family Communication: Mother Disposition Plan: Will depend on hospital course   Consultants:   Dietary team  Procedures:   None  Antimicrobials:   Restart IV Cefepime    Subjective: No significant history from patient.  Objective: Vitals:   10/27/17 1747 10/27/17 2300 10/28/17 0802 10/28/17 1714  BP: 128/77 125/66 124/75 133/74  Pulse: 62 (!) 54 69 88  Resp: 18 18 20  (!) 22  Temp: 99.6 F (37.6 C) 99.3 F (37.4 C) 98.9 F (37.2 C) 98.6 F (37 C)  TempSrc: Oral Oral Oral Oral  SpO2: 99% 98% 100% 100%  Weight:      Height:        Intake/Output Summary (Last 24 hours) at 10/28/2017 1741 Last data filed at 10/28/2017 1427 Gross per 24 hour  Intake 1170 ml  Output -  Net 1170 ml   Filed Weights   10/20/17 0600 10/24/17 0500 10/26/17 0500  Weight: 76.2 kg 70.2 kg 67.1 kg    Examination:  General exam: Not in any distress.  Patient looks a lot better today.    Respiratory system: Clear to auscultation. Cardiovascular system: S1 & S2 heard. Gastrointestinal system: Abdomen is nondistended, soft and nontender. No organomegaly or masses felt. Normal bowel sounds heard. Central nervous system: Awake and alert. Weak, but moves all limbs. Extremities: No leg edema.  Data Reviewed: I have personally reviewed following labs and imaging studies  CBC: Recent Labs  Lab 10/22/17 0312 10/23/17 0213 10/24/17 0439 10/25/17 0320 10/26/17 0455 10/27/17 0900  WBC 11.3* 12.4* 14.8* 16.8* 14.0* 12.1*  NEUTROABS 8.1* 8.6* 10.7* 14.8*  --  8.7*  HGB 9.8* 9.6* 10.7* 11.3* 10.4* 10.1*  HCT 32.8* 31.3* 35.5* 37.0 33.8* 34.0*  MCV 90.9 88.7 91.3 88.3 90.1 91.9  PLT 562* 574* 669* 785* 719* 658*   Basic Metabolic Panel: Recent Labs  Lab 10/23/17 0213  10/24/17 0439 10/25/17 0320 10/26/17 0455 10/26/17 0740 10/26/17 1511 10/26/17 2132 10/27/17 0831  NA 144   <  >  --  149* 152* 153* 152* 146* 146*  K 3.9   < >  --  3.2* 3.3* 3.9 3.6 3.9 3.2*  CL 104   < >  --  104 117* 117* 117* 110 114*  CO2 29   < >  --  29 29 29 29 27 25   GLUCOSE 102*   < >  --  130* 130* 132* 157* 335* 141*  BUN 33*   < >  --  30* 35* 34* 34* 28* 23*  CREATININE 0.49   < >  --  0.48 0.58 0.51 0.52 0.57 0.55  CALCIUM 8.6*   < >  --  9.3 8.8* 9.1 9.1 8.8* 9.1  MG 2.2  --  2.3 2.3 2.5*  --  2.5*  --  2.2  PHOS 3.4  --  5.3* 3.1 4.2  --   --   --  3.5   < > = values in this interval not displayed.   GFR: Estimated Creatinine Clearance: 95.9 mL/min (by C-G formula based on SCr of 0.55 mg/dL). Liver Function Tests: Recent Labs  Lab 10/27/17 0831  ALBUMIN 2.7*   No results for  input(s): LIPASE, AMYLASE in the last 168 hours. Recent Labs  Lab 10/26/17 1511  AMMONIA 26   Coagulation Profile: No results for input(s): INR, PROTIME in the last 168 hours. Cardiac Enzymes: Recent Labs  Lab 10/24/17 0439 10/26/17 0740  CKTOTAL 61 87  CKMB 0.6  --    BNP (last 3 results) No results for input(s): PROBNP in the last 8760 hours. HbA1C: No results for input(s): HGBA1C in the last 72 hours. CBG: Recent Labs  Lab 10/27/17 1127 10/28/17 0034 10/28/17 0755 10/28/17 1158 10/28/17 1711  GLUCAP 112* 110* 111* 102* 91   Lipid Profile: No results for input(s): CHOL, HDL, LDLCALC, TRIG, CHOLHDL, LDLDIRECT in the last 72 hours. Thyroid Function Tests: No results for input(s): TSH, T4TOTAL, FREET4, T3FREE, THYROIDAB in the last 72 hours. Anemia Panel: No results for input(s): VITAMINB12, FOLATE, FERRITIN, TIBC, IRON, RETICCTPCT in the last 72 hours. Urine analysis:    Component Value Date/Time   COLORURINE YELLOW 10/25/2017 0852   APPEARANCEUR CLOUDY (A) 10/25/2017 0852   LABSPEC 1.025 10/25/2017 0852   PHURINE 5.0 10/25/2017 0852   GLUCOSEU NEGATIVE 10/25/2017 0852   HGBUR MODERATE (A) 10/25/2017 0852   BILIRUBINUR NEGATIVE 10/25/2017 0852   BILIRUBINUR small  02/25/2017 1428   KETONESUR 20 (A) 10/25/2017 0852   PROTEINUR 30 (A) 10/25/2017 0852   UROBILINOGEN 0.2 02/25/2017 1428   NITRITE NEGATIVE 10/25/2017 0852   LEUKOCYTESUR NEGATIVE 10/25/2017 0852   Sepsis Labs: @LABRCNTIP (procalcitonin:4,lacticidven:4)  ) Recent Results (from the past 240 hour(s))  Culture, respiratory (non-expectorated)     Status: None   Collection Time: 10/19/17  1:27 PM  Result Value Ref Range Status   Specimen Description TRACHEAL ASPIRATE  Final   Special Requests NONE  Final   Gram Stain   Final    RARE WBC PRESENT,BOTH PMN AND MONONUCLEAR NO ORGANISMS SEEN    Culture   Final    NO GROWTH 2 DAYS Performed at [facility], [address] [place], [place] [address]    Report Status 10/21/2017 FINAL  Final  Culture, Urine     Status: None   Collection Time: 10/25/17 10:47 AM  Result Value Ref Range Status   Specimen Description URINE, CATHETERIZED  Final   Special Requests NONE  Final   Culture   Final    NO GROWTH Performed at [facility], [address] [place], [place] [address]    Report Status 10/26/2017 FINAL  Final         Radiology Studies: No results found.      Scheduled Meds: . chlorhexidine  15 mL Mouth Rinse BID  . Chlorhexidine Gluconate Cloth  6 each Topical Daily  . enoxaparin (LOVENOX) injection  40 mg Subcutaneous Daily  . insulin aspart  0-9 Units Subcutaneous TID WC  . mouth rinse  15 mL Mouth Rinse q12n4p  . multivitamin with minerals  1 tablet Oral Daily  . sodium chloride flush  10-40 mL Intracatheter Q12H   Continuous Infusions: . sodium chloride Stopped (10/23/17 0551)  . ceFEPime (MAXIPIME) IV 2 g (10/28/17 1427)  . dextrose 5 % with KCl 20 mEq / L 20 mEq (10/28/17 1425)     LOS: 14 days    Time spent: 25 minutes.   Berton MountSylvester Cabria Micalizzi, MD  [facility] Pager #: [phone] 7PM-7AM contact night coverage as above

## 2017-10-28 NOTE — Plan of Care (Signed)
  Problem: Education: Goal: Knowledge of General Education information will improve Description: Including pain rating scale, medication(s)/side effects and non-pharmacologic comfort measures Outcome: Progressing   Problem: Health Behavior/Discharge Planning: Goal: Ability to manage health-related needs will improve Outcome: Progressing   Problem: Clinical Measurements: Goal: Respiratory complications will improve Outcome: Progressing   

## 2017-10-28 NOTE — Care Management Note (Addendum)
Case Management Note  Patient Details  Name: Stephanie SchneidersJuliana Young MRN: [id number] Date of Birth: [birth year]  Subjective/Objective:  From home, has no insurance, she [facility], per cir rep, she is doing to  Well for cir, may need HHPT  At dc, if so it will need to be charity, Lupita [facility]Donna [facility] checking to see if she would qualify for HHPT, NCM gave patient Match letter in case she discharges tomorrow.  Will need HHPT order if needs at dc, check with [facility] to see if she qualifies for charity.      8/26 Letha Capeeborah Dayven Linsley RN, BSN - spoke with patient today, concerning CIR rec, patient states she just wants to go home with the charity HHPT and Social work.  Waiting to here from Lupita LeashDonna to see if she qualified for charilty.  Patient does qualify for charity for HHPT, social work. Will need orders.            Action/Plan: NCM will follow for transition of care needs.   Expected Discharge Date:                  Expected Discharge Plan:  Home w Home Health Services  In-House Referral:     Discharge planning Services  CM Consult  Post Acute Care Choice:  Home Health Choice offered to:  Patient  DME Arranged:    DME Agency:     HH Arranged:  PT HH Agency:  [facility]  Status of Service:  In process, will continue to follow  If discussed at Long Length of Stay Meetings, dates discussed:    Additional Comments:  Leone Havenaylor, Jeraldin Fesler [place], RN 10/28/2017, 4:11 PM

## 2017-10-28 NOTE — Progress Notes (Addendum)
Inpatient Rehabilitation-Admissions Coordinator   Spoke with pt and her family at the bedside. Pt expressed concern over payment for CIR as she is uninsured. AC did mention that pt may be eligible for financial assistance programs, but that that is determined later on. At this time, pt feels she is moving better and is leaning towards returning home with family for assist. Pt feels she has adequate support at home as long as she continues to progress. No therapy notes are in today to access progress, however pt reports she is moving much better today.  AC will follow up with pt on Monday if she remains in house. Pt reports she will get [facility] if she wants [facility].   Please call if questions.   Nanine MeansKelly Genessis Flanary, OTR/L  Rehab Admissions Coordinator  [phone] 10/28/2017 4:34 PM

## 2017-10-29 LAB — GLUCOSE, CAPILLARY
Glucose-Capillary: 87 mg/dL (ref 70–99)
Glucose-Capillary: 89 mg/dL (ref 70–99)
Glucose-Capillary: 86 mg/dL (ref 70–99)
Glucose-Capillary: 110 mg/dL — ABNORMAL HIGH (ref 70–99)

## 2017-10-29 LAB — RENAL FUNCTION PANEL
Albumin: 2.6 g/dL — ABNORMAL LOW (ref 3.5–5.0)
Anion gap: 6 (ref 5–15)
BUN: 11 mg/dL (ref 6–20)
CO2: 22 mmol/L (ref 22–32)
Calcium: 8.2 mg/dL — ABNORMAL LOW (ref 8.9–10.3)
Chloride: 103 mmol/L (ref 98–111)
Creatinine, Ser: 0.5 mg/dL (ref 0.44–1.00)
GFR calc Af Amer: >60 mL/min (ref >60)
GFR calc non Af Amer: >60 mL/min (ref >60)
Glucose, Bld: 355 mg/dL — ABNORMAL HIGH (ref 70–99)
Phosphorus: 3.8 mg/dL (ref 2.5–4.6)
Potassium: 5 mmol/L (ref 3.5–5.1)
Sodium: 131 mmol/L — ABNORMAL LOW (ref 135–145)

## 2017-10-29 LAB — PHOSPHORUS: Phosphorus: 3.7 mg/dL (ref 2.5–4.6)

## 2017-10-29 LAB — CBC WITH DIFFERENTIAL/PLATELET
Abs Immature Granulocytes: 0.1 10*3/uL (ref 0.0–0.1)
Basophils Absolute: 0.2 10*3/uL — ABNORMAL HIGH (ref 0.0–0.1)
Basophils Relative: 2 %
Eosinophils Absolute: 0.9 10*3/uL — ABNORMAL HIGH (ref 0.0–0.7)
Eosinophils Relative: 9 %
HCT: 31.7 % — ABNORMAL LOW (ref 36.0–46.0)
Hemoglobin: 9.8 g/dL — ABNORMAL LOW (ref 12.0–15.0)
Immature Granulocytes: 1 %
Lymphocytes Relative: 27 %
Lymphs Abs: 2.7 10*3/uL (ref 0.7–4.0)
MCH: 27.2 pg (ref 26.0–34.0)
MCHC: 30.9 g/dL (ref 30.0–36.0)
MCV: 88.1 fL (ref 78.0–100.0)
Monocytes Absolute: 0.8 10*3/uL (ref 0.1–1.0)
Monocytes Relative: 8 %
Neutro Abs: 5.4 10*3/uL (ref 1.7–7.7)
Neutrophils Relative %: 53 %
Platelets: 562 10*3/uL — ABNORMAL HIGH (ref 150–400)
RBC: 3.6 MIL/uL — ABNORMAL LOW (ref 3.87–5.11)
RDW: 13.7 % (ref 11.5–15.5)
WBC: 10.1 10*3/uL (ref 4.0–10.5)

## 2017-10-29 LAB — MAGNESIUM: Magnesium: 1.9 mg/dL (ref 1.7–2.4)

## 2017-10-29 MED ORDER — ACETAMINOPHEN 325 MG PO TABS
650.0000 mg | ORAL_TABLET | Freq: Four times a day (QID) | ORAL | Status: DC | PRN
  Administered 2017-10-29 – 2017-10-31 (×4): 650 mg via ORAL
  Filled 2017-10-29 (×4): qty 2

## 2017-10-29 NOTE — Progress Notes (Signed)
Pharmacy Antibiotic Note  Stephanie Young is a 27 y.o. female admitted on 10/12/2017 with questionable  pneumonia.  Patient was treated for 6 days of Cefepime from 8/14 to 8/19. Patient has low grade fever and MD restarting antibiotics with pending CT chest.  Pharmacy has been consulted for restart Cefepime dosing.  WBC is down to normal, SCr remains stable, and patient is afebrile.  Plan: Continue Cefepime 2g IV every 8 hours through 8/24 per MD. Stop date has been added. Pharmacy will sign off.   Height: _0  (165.1 cm) Weight: 147 lb 14.9 oz (67.1 kg) IBW/kg (Calculated) : 57  Temp (24hrs), Avg:98.4 F (36.9 C), Min:98.2 F (36.8 C), Max:98.6 F (37 C)  Recent Labs  Lab 10/24/17 0050 10/24/17 0439 10/25/17 0320 10/26/17 0455 10/26/17 0740 10/26/17 1511 10/26/17 2132 10/27/17 0831 10/27/17 0900 10/29/17 0312  WBC  --  14.8* 16.8* 14.0*  --   --   --   --  12.1* 10.1  CREATININE 0.49  --  0.48 0.58 0.51 0.52 0.57 0.55  --  0.50  LATICACIDVEN 0.7  --   --   --   --  0.8  --   --   --   --     Estimated Creatinine Clearance: 95.9 mL/min (by C-G formula based on SCr of 0.5 mg/dL).    No Known Allergies  Antimicrobials this admission: Vancomycin 8/10>>8/16 Unasyn 8/9 >>8/14 Cefepime 8/14 >>8/19; 8/21 >>(8/24) Azith  8/8>>8/11 Rocephin 8/8>>8/9   Microbiology results: 8/20 Urine - negative 8/10 MRSA >> negative 8/8-9 Resp panel - neg 8/7 BCx >> NGFinal 8/11 legionella - neg 8/11 BAL - neg 8/12 Bcx - neg 8/14 TA - neg  Thank you for allowing pharmacy to be a part of this patient's care.  Jackson Latino, PharmD PGY1 Pharmacy Resident Phone [phone] 10/29/2017     11:30 AM

## 2017-10-29 NOTE — Progress Notes (Addendum)
PROGRESS NOTE    Stephanie Young  ZOX:[id number] DOB: 1990/07/11 DOA: 10/12/2017 PCP: Claiborne Rigg, NP  Outpatient Specialists:     Brief Narrative: Patient is a 27 year old female with no significant PMH.  Patient was admitted on 13 October 2017 with cough, SOB and CP, diagnosed with ARDS with negative blood and respiratory cultures.  10/29/2017: Patient is new to me.  Patient was seen alongside patient's mother.  Patient's mother was communicated to without Spanish translator or the language translating equipment.  All questions were answered.  She  spoke [place] well.  Patient is complaining of breaking out on her face and her chest and she thinks is the cefepime that caused it.  She says she never had any breakouts before.  She always had smooth face. never had any rash or acne or   any breakout on any part of her body.  She never was on any antibiotics in the past.  Mother stated that she herself has allergy to sulfa.  She also complains of pain in the chest and back when she takes a deep breath doing the incentive spirometry.  I will add Tylenol. Continue to monitor patient's renal function, electrolytes and CBC.  Leukocytosis is resolved  ,both urine and blood cultures have no growth.cefipeme discontinued.  Thrombocytosis is resolving.  Overall, patient seems to be moving in the right direction. Continue  dysphagia 2 diet and thickened liquids only. I will add allergy to cefepime to her list of diagnosis  Assessment & Plan:   Principal Problem:   Multifocal pneumonia Active Problems:   Hypokalemia   Sepsis (HCC)   Lobar pneumonia (HCC)   Acute respiratory failure with hypoxia (HCC)   ARDS (adult respiratory distress syndrome) (HCC)   Tachypnea   Hyponatremia   Leukocytosis   Acute blood loss anemia  Multifocal pneumonia: CT scan of the chest without contrast. Check procalcitonin level. Continue IV antibiotics.  However, calcitonin level is less than 0.1. Complete course  of antibiotics. Aspiration precautions. 10/28/2017: Complete course of antibiotics with IV cefepime.  Likely DC IV cefepime after tomorrow's dose.  ARDS/acute respiratory failure: Pulmonary input is highly appreciated. This is thought to be secondary to VAPE Repeat ABG done on 10/26/2017 noted. 10/28/2017: Resolved.  Aspiration risk: Patient continues to drool. Patient continues to report sore throat Consider discussed lidocaine for numbing effect if okay with speech therapy team. Continue to monitor mental status.  Check ammonia level. 10/27/2017: Drooling has improved significantly.  Patient is able suction self. 10/28/2017: Kindly see above.  Dysphagia 2 diet with thickened liquid.  Hypernatremia: Fluid deficit is approximately 3.12 L. Cautiously hydrate with D5 water. Change to free water when feasible. Check BMP every 4 hours. Check urine sodium.  Suspect this is all secondary to poor p.o. Intake. 10/27/2017: Sodium is down to 146.  We decreased the rate of IV fluid to 75 cc an hour.  We will also change D5 water to D5 water with 20 of KCl to each liter. Continue to monitor renal function and electrolytes. 10/28/2017: Repeat renal panel in the morning.  Hypokalemia: Repleted. Repeat potassium level is 3.9. 10/27/2017: Potassium level was 3.2 today.  Will replete. 10/28/2017: Repeat renal panel in the morning  Mild hypophosphatemic: Continue to monitor and replete.    Nutrition: Dysphagia 2 diet.  Further management will depend on hospital course.   DVT prophylaxis: SCD Code Status: Full Family Communication: Mother at bedside Disposition Plan: Will depend on hospital course   Consultants:  Dietary team  Procedures:   None  Antimicrobials:   D/C  IV Cefepime    Subjective: No significant history from patient.  Objective: Vitals:   10/28/17 0802 10/28/17 1714 10/29/17 0118 10/29/17 0816  BP: 124/75 133/74 119/68 126/77  Pulse: 69 88 81 77  Resp: 20 (!)  22 (!) 24 18  Temp: 98.9 F (37.2 C) 98.6 F (37 C) 98.5 F (36.9 C) 98.2 F (36.8 C)  TempSrc: Oral Oral Oral Oral  SpO2: 100% 100% 100% 100%  Weight:      Height:        Intake/Output Summary (Last 24 hours) at 10/29/2017 1137 Last data filed at 10/28/2017 2151 Gross per 24 hour  Intake 1150 ml  Output -  Net 1150 ml   Filed Weights   10/20/17 0600 10/24/17 0500 10/26/17 0500  Weight: 76.2 kg 70.2 kg 67.1 kg    Examination:  General exam: Not in any distress.  Patient looks a lot better today.    Respiratory system: Clear to auscultation. Cardiovascular system: S1 & S2 heard. Gastrointestinal system: Abdomen is nondistended, soft and nontender. No organomegaly or masses felt. Normal bowel sounds heard. Central nervous system: Awake and alert. Weak, but moves all limbs. Extremities: No leg edema.  Data Reviewed: I have personally reviewed following labs and imaging studies  CBC: Recent Labs  Lab 10/23/17 0213 10/24/17 0439 10/25/17 0320 10/26/17 0455 10/27/17 0900 10/29/17 0312  WBC 12.4* 14.8* 16.8* 14.0* 12.1* 10.1  NEUTROABS 8.6* 10.7* 14.8*  --  8.7* 5.4  HGB 9.6* 10.7* 11.3* 10.4* 10.1* 9.8*  HCT 31.3* 35.5* 37.0 33.8* 34.0* 31.7*  MCV 88.7 91.3 88.3 90.1 91.9 88.1  PLT 574* 669* 785* 719* 658* 562*   Basic Metabolic Panel: Recent Labs  Lab 10/24/17 0439 10/25/17 0320 10/26/17 0455 10/26/17 0740 10/26/17 1511 10/26/17 2132 10/27/17 0831 10/29/17 0312  NA  --  149* 152* 153* 152* 146* 146* 131*  K  --  3.2* 3.3* 3.9 3.6 3.9 3.2* 5.0  CL  --  104 117* 117* 117* 110 114* 103  CO2  --  29 29 29 29 27 25 22   GLUCOSE  --  130* 130* 132* 157* 335* 141* 355*  BUN  --  30* 35* 34* 34* 28* 23* 11  CREATININE  --  0.48 0.58 0.51 0.52 0.57 0.55 0.50  CALCIUM  --  9.3 8.8* 9.1 9.1 8.8* 9.1 8.2*  MG 2.3 2.3 2.5*  --  2.5*  --  2.2 1.9  PHOS 5.3* 3.1 4.2  --   --   --  3.5 3.8  3.7   GFR: Estimated Creatinine Clearance: 95.9 mL/min (by C-G formula based  on SCr of 0.5 mg/dL). Liver Function Tests: Recent Labs  Lab 10/27/17 0831 10/29/17 0312  ALBUMIN 2.7* 2.6*   No results for input(s): LIPASE, AMYLASE in the last 168 hours. Recent Labs  Lab 10/26/17 1511  AMMONIA 26   Coagulation Profile: No results for input(s): INR, PROTIME in the last 168 hours. Cardiac Enzymes: Recent Labs  Lab 10/24/17 0439 10/26/17 0740  CKTOTAL 61 87  CKMB 0.6  --    BNP (last 3 results) No results for input(s): PROBNP in the last 8760 hours. HbA1C: No results for input(s): HGBA1C in the last 72 hours. CBG: Recent Labs  Lab 10/28/17 0755 10/28/17 1158 10/28/17 1711 10/28/17 2143 10/29/17 0818  GLUCAP 111* 102* 91 96 87   Lipid Profile: No results for input(s): CHOL, HDL, LDLCALC, TRIG,  CHOLHDL, LDLDIRECT in the last 72 hours. Thyroid Function Tests: No results for input(s): TSH, T4TOTAL, FREET4, T3FREE, THYROIDAB in the last 72 hours. Anemia Panel: No results for input(s): VITAMINB12, FOLATE, FERRITIN, TIBC, IRON, RETICCTPCT in the last 72 hours. Urine analysis:    Component Value Date/Time   COLORURINE YELLOW 10/25/2017 0852   APPEARANCEUR CLOUDY (A) 10/25/2017 0852   LABSPEC 1.025 10/25/2017 0852   PHURINE 5.0 10/25/2017 0852   GLUCOSEU NEGATIVE 10/25/2017 0852   HGBUR MODERATE (A) 10/25/2017 0852   BILIRUBINUR NEGATIVE 10/25/2017 0852   BILIRUBINUR small 02/25/2017 1428   KETONESUR 20 (A) 10/25/2017 0852   PROTEINUR 30 (A) 10/25/2017 0852   UROBILINOGEN 0.2 02/25/2017 1428   NITRITE NEGATIVE 10/25/2017 0852   LEUKOCYTESUR NEGATIVE 10/25/2017 0852   Sepsis Labs: @LABRCNTIP (procalcitonin:4,lacticidven:4)  ) Recent Results (from the past 240 hour(s))  Culture, respiratory (non-expectorated)     Status: None   Collection Time: 10/19/17  1:27 PM  Result Value Ref Range Status   Specimen Description TRACHEAL ASPIRATE  Final   Special Requests NONE  Final   Gram Stain   Final    RARE WBC PRESENT,BOTH PMN AND MONONUCLEAR NO  ORGANISMS SEEN    Culture   Final    NO GROWTH 2 DAYS Performed at [facility], [address] [place], [place] [address]    Report Status 10/21/2017 FINAL  Final  Culture, Urine     Status: None   Collection Time: 10/25/17 10:47 AM  Result Value Ref Range Status   Specimen Description URINE, CATHETERIZED  Final   Special Requests NONE  Final   Culture   Final    NO GROWTH Performed at [facility], [address] [place], [place] [address]    Report Status 10/26/2017 FINAL  Final         Radiology Studies: No results found.      Scheduled Meds: . chlorhexidine  15 mL Mouth Rinse BID  . Chlorhexidine Gluconate Cloth  6 each Topical Daily  . enoxaparin (LOVENOX) injection  40 mg Subcutaneous Daily  . insulin aspart  0-9 Units Subcutaneous TID WC  . mouth rinse  15 mL Mouth Rinse q12n4p  . multivitamin with minerals  1 tablet Oral Daily  . sodium chloride flush  10-40 mL Intracatheter Q12H   Continuous Infusions: . sodium chloride Stopped (10/23/17 0551)  . ceFEPime (MAXIPIME) IV 2 g (10/29/17 0528)  . dextrose 5 % with KCl 20 mEq / L 20 mEq (10/29/17 0207)     LOS: 15 days    Time spent: 25 minutes.   Barrie FolkUgah MD  [facility] Pager #: [phone] 7PM-7AM contact night coverage as above

## 2017-10-29 NOTE — Progress Notes (Signed)
Pt c/o of rash on face and chest  Says she started antibotic yesterday, rash noted on face and upper chest MD paged to ask about antibotic

## 2017-10-30 LAB — GLUCOSE, CAPILLARY
GLUCOSE-CAPILLARY: 89 mg/dL (ref 70–99)
Glucose-Capillary: 101 mg/dL — ABNORMAL HIGH (ref 70–99)
Glucose-Capillary: 88 mg/dL (ref 70–99)

## 2017-10-30 NOTE — Progress Notes (Signed)
PROGRESS NOTE    Stephanie Young  WUJ:[id number] DOB: [birth year] DOA: 10/12/2017 PCP: Claiborne RiggFleming, [name], NP  Outpatient Specialists:     Brief Narrative: Patient is a 27 year old female with no significant PMH.  Patient was admitted on 13 October 2017 with cough, SOB and CP, diagnosed with ARDS with negative blood and respiratory cultures.  10/30/17: And is doing much better her affect is much brighter and improved she says she was able to eat ice chips last night and today she was able to tolerate mashed potato with ground meats her appetite is improved she also feels a little stronger she was able to ambulate with the nurse around the floor denies any sore throat she still have but hoarseness of her voice she is trying to do high incentive spirometry but she has like chest and back pain when she does it she is been advised to continue but more slowly less forceful.  10/29/2017: Patient is new to me.  Patient was seen alongside patient's mother.  Patient's mother was communicated to without Spanish translator or the language translating equipment.  All questions were answered.  She is spoke [place] well.  Patient is complaining of breaking out on her face and her chest and she thinks is the cefepime that caused it.  She says she never had any breakouts before.  She always had smooth face, never had any rash on any breakout on any part of her body.  She never was on any antibiotics in the past.  Mother stated that she herself has allergy to sulfa.  She also complains of pain in the chest and back when she takes a deep breath doing the incentive spirometry.  I will add Tylenol. Continue to monitor patient's renal function, electrolytes and CBC.  Leukocytosis is resolved  ,both urine and blood cultures have no growth.cefipeme discontinued.  Thrombocytosis is resolving.  Overall, patient seems to be moving in the right direction. Continue  dysphagia 2 diet and thickened liquids only. I will add allergy to  cefepime to her list of diagnosis  Assessment & Plan:   Principal Problem:   Multifocal pneumonia Active Problems:   Hypokalemia   Sepsis (HCC)   Lobar pneumonia (HCC)   Acute respiratory failure with hypoxia (HCC)   ARDS (adult respiratory distress syndrome) (HCC)   Tachypnea   Hyponatremia   Leukocytosis   Acute blood loss anemia  Multifocal pneumonia: CT scan of the chest without contrast. Check procalcitonin level. Continue IV antibiotics.  However, calcitonin level is less than 0.1. Complete course of antibiotics. Aspiration precautions. 10/28/2017: Complete course of antibiotics with IV cefepime.  Likely DC IV cefepime after tomorrow's dose.  ARDS/acute respiratory failure: Pulmonary input is highly appreciated. This is thought to be secondary to VAPE Repeat ABG done on 10/26/2017 noted. 10/28/2017: Resolved.  Aspiration risk: Patient continues to drool. Patient continues to report sore throat Consider discussed lidocaine for numbing effect if okay with speech therapy team. Continue to monitor mental status.  Check ammonia level. 10/27/2017: Drooling has improved significantly.  Patient is able suction self. 10/28/2017: Kindly see above.  Dysphagia 2 diet with thickened liquid.  Hypernatremia: Fluid deficit is approximately 3.12 L. Cautiously hydrate with D5 water. Change to free water when feasible. Check BMP every 4 hours. Check urine sodium.  Suspect this is all secondary to poor p.o. Intake. 10/27/2017: Sodium is down to 146.  We decreased the rate of IV fluid to 75 cc an hour.  We will also change D5  water to D5 water with 20 of KCl to each liter. Continue to monitor renal function and electrolytes. 10/28/2017: Repeat renal panel in the morning.  Hypokalemia: Repleted. Repeat potassium level is 3.9. 10/27/2017: Potassium level was 3.2 today.  Will replete. 10/28/2017: Repeat renal panel in the morning  Mild hypophosphatemic: Continue to monitor and replete.      Nutrition: Dysphagia 2 diet.  Further management will depend on hospital course.   DVT prophylaxis: SCD Code Status: Full Family Communication: none  at bedside Disposition Plan: Will depend on hospital course ,anticipate discharge in 1 to 2 days   Consultants:   Dietary team  Procedures:   None  Antimicrobials:   D/C  IV Cefepime possible allergy with rash   Subjective: No significant history from patient.  Objective: Vitals:   10/29/17 1954 10/29/17 2100 10/30/17 0119 10/30/17 0827  BP: 133/72 (!) 157/84 104/84 119/75  Pulse: 92 91 (!) 102 76  Resp: 20 20 20    Temp: 98.1 F (36.7 C) 98.6 F (37 C) 98.4 F (36.9 C) 98.7 F (37.1 C)  TempSrc: Oral Oral Oral Oral  SpO2: 100% 99% 99% 100%  Weight:      Height:        Intake/Output Summary (Last 24 hours) at 10/30/2017 1619 Last data filed at 10/30/2017 1500 Gross per 24 hour  Intake 1720 ml  Output 900 ml  Net 820 ml   Filed Weights   10/20/17 0600 10/24/17 0500 10/26/17 0500  Weight: 76.2 kg 70.2 kg 67.1 kg    Examination:  General exam: Not in any distress.  Patient looks a lot better today.    Respiratory system: Clear to auscultation. Cardiovascular system: S1 & S2 heard. Gastrointestinal system: Abdomen is nondistended, soft and nontender. No organomegaly or masses felt. Normal bowel sounds heard. Central nervous system: Awake and alert. Weak, but moves all limbs. Extremities: No leg edema.  Data Reviewed: I have personally reviewed following labs and imaging studies  CBC: Recent Labs  Lab 10/24/17 0439 10/25/17 0320 10/26/17 0455 10/27/17 0900 10/29/17 0312  WBC 14.8* 16.8* 14.0* 12.1* 10.1  NEUTROABS 10.7* 14.8*  --  8.7* 5.4  HGB 10.7* 11.3* 10.4* 10.1* 9.8*  HCT 35.5* 37.0 33.8* 34.0* 31.7*  MCV 91.3 88.3 90.1 91.9 88.1  PLT 669* 785* 719* 658* 562*   Basic Metabolic Panel: Recent Labs  Lab 10/24/17 0439 10/25/17 0320 10/26/17 0455 10/26/17 0740 10/26/17 1511  10/26/17 2132 10/27/17 0831 10/29/17 0312  NA  --  149* 152* 153* 152* 146* 146* 131*  K  --  3.2* 3.3* 3.9 3.6 3.9 3.2* 5.0  CL  --  104 117* 117* 117* 110 114* 103  CO2  --  29 29 29 29 27 25 22   GLUCOSE  --  130* 130* 132* 157* 335* 141* 355*  BUN  --  30* 35* 34* 34* 28* 23* 11  CREATININE  --  0.48 0.58 0.51 0.52 0.57 0.55 0.50  CALCIUM  --  9.3 8.8* 9.1 9.1 8.8* 9.1 8.2*  MG 2.3 2.3 2.5*  --  2.5*  --  2.2 1.9  PHOS 5.3* 3.1 4.2  --   --   --  3.5 3.8  3.7   GFR: Estimated Creatinine Clearance: 95.9 mL/min (by C-G formula based on SCr of 0.5 mg/dL). Liver Function Tests: Recent Labs  Lab 10/27/17 0831 10/29/17 0312  ALBUMIN 2.7* 2.6*   No results for input(s): LIPASE, AMYLASE in the last 168 hours. Recent Labs  Lab 10/26/17 1511  AMMONIA 26   Coagulation Profile: No results for input(s): INR, PROTIME in the last 168 hours. Cardiac Enzymes: Recent Labs  Lab 10/24/17 0439 10/26/17 0740  CKTOTAL 61 87  CKMB 0.6  --    BNP (last 3 results) No results for input(s): PROBNP in the last 8760 hours. HbA1C: No results for input(s): HGBA1C in the last 72 hours. CBG: Recent Labs  Lab 10/29/17 1144 10/29/17 1750 10/29/17 2120 10/30/17 0800 10/30/17 1201  GLUCAP 89 86 110* 89 101*   Lipid Profile: No results for input(s): CHOL, HDL, LDLCALC, TRIG, CHOLHDL, LDLDIRECT in the last 72 hours. Thyroid Function Tests: No results for input(s): TSH, T4TOTAL, FREET4, T3FREE, THYROIDAB in the last 72 hours. Anemia Panel: No results for input(s): VITAMINB12, FOLATE, FERRITIN, TIBC, IRON, RETICCTPCT in the last 72 hours. Urine analysis:    Component Value Date/Time   COLORURINE YELLOW 10/25/2017 0852   APPEARANCEUR CLOUDY (A) 10/25/2017 0852   LABSPEC 1.025 10/25/2017 0852   PHURINE 5.0 10/25/2017 0852   GLUCOSEU NEGATIVE 10/25/2017 0852   HGBUR MODERATE (A) 10/25/2017 0852   BILIRUBINUR NEGATIVE 10/25/2017 0852   BILIRUBINUR small 02/25/2017 1428   KETONESUR 20 (A)  10/25/2017 0852   PROTEINUR 30 (A) 10/25/2017 0852   UROBILINOGEN 0.2 02/25/2017 1428   NITRITE NEGATIVE 10/25/2017 0852   LEUKOCYTESUR NEGATIVE 10/25/2017 0852   Sepsis Labs: @LABRCNTIP (procalcitonin:4,lacticidven:4)  ) Recent Results (from the past 240 hour(s))  Culture, Urine     Status: None   Collection Time: 10/25/17 10:47 AM  Result Value Ref Range Status   Specimen Description URINE, CATHETERIZED  Final   Special Requests NONE  Final   Culture   Final    NO GROWTH Performed at [facility], [address], [place], [place] [address]    Report Status 10/26/2017 FINAL  Final         Radiology Studies: No results found.      Scheduled Meds: . chlorhexidine  15 mL Mouth Rinse BID  . Chlorhexidine Gluconate Cloth  6 each Topical Daily  . enoxaparin (LOVENOX) injection  40 mg Subcutaneous Daily  . insulin aspart  0-9 Units Subcutaneous TID WC  . mouth rinse  15 mL Mouth Rinse q12n4p  . multivitamin with minerals  1 tablet Oral Daily  . sodium chloride flush  10-40 mL Intracatheter Q12H   Continuous Infusions: . sodium chloride Stopped (10/23/17 0551)  . dextrose 5 % with KCl 20 mEq / L 20 mEq (10/30/17 0529)     LOS: 16 days    Time spent: <25 minutes.   Barrie Folk MD  [facility] Pager #: [phone] 7PM-7AM contact night coverage as above

## 2017-10-31 DIAGNOSIS — R1312 Dysphagia, oropharyngeal phase: Secondary | ICD-10-CM

## 2017-10-31 LAB — BASIC METABOLIC PANEL
Anion gap: 8 (ref 5–15)
BUN: 6 mg/dL (ref 6–20)
CALCIUM: 8.9 mg/dL (ref 8.9–10.3)
CO2: 25 mmol/L (ref 22–32)
CREATININE: 0.47 mg/dL (ref 0.44–1.00)
Chloride: 105 mmol/L (ref 98–111)
GLUCOSE: 95 mg/dL (ref 70–99)
Potassium: 3.9 mmol/L (ref 3.5–5.1)
Sodium: 138 mmol/L (ref 135–145)

## 2017-10-31 LAB — CBC WITH DIFFERENTIAL/PLATELET
Abs Immature Granulocytes: 0.1 10*3/uL (ref 0.0–0.1)
BASOS PCT: 1 %
Basophils Absolute: 0.1 10*3/uL (ref 0.0–0.1)
EOS ABS: 0.8 10*3/uL — AB (ref 0.0–0.7)
EOS PCT: 10 %
HCT: 34.9 % — ABNORMAL LOW (ref 36.0–46.0)
Hemoglobin: 10.9 g/dL — ABNORMAL LOW (ref 12.0–15.0)
Immature Granulocytes: 1 %
LYMPHS ABS: 3.8 10*3/uL (ref 0.7–4.0)
Lymphocytes Relative: 45 %
MCH: 27.3 pg (ref 26.0–34.0)
MCHC: 31.2 g/dL (ref 30.0–36.0)
MCV: 87.3 fL (ref 78.0–100.0)
Monocytes Absolute: 0.7 10*3/uL (ref 0.1–1.0)
Monocytes Relative: 8 %
NEUTROS PCT: 35 %
Neutro Abs: 3 10*3/uL (ref 1.7–7.7)
PLATELETS: 537 10*3/uL — AB (ref 150–400)
RBC: 4 MIL/uL (ref 3.87–5.11)
RDW: 13.9 % (ref 11.5–15.5)
WBC: 8.6 10*3/uL (ref 4.0–10.5)

## 2017-10-31 MED ORDER — ALUM & MAG HYDROXIDE-SIMETH 200-200-20 MG/5ML PO SUSP
15.0000 mL | ORAL | Status: DC | PRN
  Administered 2017-10-31: 15 mL via ORAL
  Filled 2017-10-31: qty 30

## 2017-10-31 NOTE — Progress Notes (Signed)
[facility] PROGRESS NOTE  Stephanie SchneidersJuliana Young ZOX:[id number] DOB: 1991/02/13 DOA: 10/12/2017  PCP: Claiborne RiggFleming, Stephanie W, NP  Brief History/Interval Summary:  Patient is a 27 year old female with no significant PMH.  Patient was admitted on 13 October 2017 with cough, SOB and CP, diagnosed with ARDS with negative blood and respiratory cultures. She had to be transferred to the intensive care unit due to worsening respiratory status.  She was intubated.  Subsequently stabilized, extubated and then transferred out of the ICU.  She has completed a course of antibiotics.  Consultants: Neurology.  Infectious disease.  Procedures: None  Antibiotics: Completed course of antibiotics  Subjective/Interval History: Patient states that she is feeling much better.  Continues to have hoarse voice.  Still has cough once in a while.  Has been ambulating.  ROS: Denies any chest pain.  Objective:  Vital Signs  Vitals:   10/30/17 0119 10/30/17 0827 10/30/17 1733 10/31/17 0006  BP: 104/84 119/75 117/80 (!) 98/56  Pulse: (!) 102 76 92 87  Resp: 20   16  Temp: 98.4 F (36.9 C) 98.7 F (37.1 C) 98.7 F (37.1 C) 98.7 F (37.1 C)  TempSrc: Oral Oral Oral Oral  SpO2: 99% 100% 100% 98%  Weight:      Height:        Intake/Output Summary (Last 24 hours) at 10/31/2017 1353 Last data filed at 10/30/2017 1900 Gross per 24 hour  Intake 2259.75 ml  Output -  Net 2259.75 ml   Filed Weights   10/20/17 0600 10/24/17 0500 10/26/17 0500  Weight: 76.2 kg 70.2 kg 67.1 kg    General appearance: alert, cooperative, appears stated age and no distress Head: Normocephalic, without obvious abnormality, atraumatic Resp: Coarse breath sounds bilaterally.  Few crackles at the bases.  No wheezing or rhonchi. Cardio: regular rate and rhythm, S1, S2 normal, no murmur, click, rub or gallop GI: soft, non-tender; bowel sounds normal; no masses,  no organomegaly Extremities: extremities normal, atraumatic, no cyanosis  or edema Neurologic: No focal deficits  Lab Results:  Data Reviewed: I have personally reviewed following labs and imaging studies  CBC: Recent Labs  Lab 10/25/17 0320 10/26/17 0455 10/27/17 0900 10/29/17 0312 10/31/17 0525  WBC 16.8* 14.0* 12.1* 10.1 8.6  NEUTROABS 14.8*  --  8.7* 5.4 3.0  HGB 11.3* 10.4* 10.1* 9.8* 10.9*  HCT 37.0 33.8* 34.0* 31.7* 34.9*  MCV 88.3 90.1 91.9 88.1 87.3  PLT 785* 719* 658* 562* 537*    Basic Metabolic Panel: Recent Labs  Lab 10/25/17 0320 10/26/17 0455  10/26/17 1511 10/26/17 2132 10/27/17 0831 10/29/17 0312 10/31/17 0525  NA 149* 152*   < > 152* 146* 146* 131* 138  K 3.2* 3.3*   < > 3.6 3.9 3.2* 5.0 3.9  CL 104 117*   < > 117* 110 114* 103 105  CO2 29 29   < > 29 27 25 22 25   GLUCOSE 130* 130*   < > 157* 335* 141* 355* 95  BUN 30* 35*   < > 34* 28* 23* 11 6  CREATININE 0.48 0.58   < > 0.52 0.57 0.55 0.50 0.47  CALCIUM 9.3 8.8*   < > 9.1 8.8* 9.1 8.2* 8.9  MG 2.3 2.5*  --  2.5*  --  2.2 1.9  --   PHOS 3.1 4.2  --   --   --  3.5 3.8  3.7  --    < > = values in this interval not displayed.  GFR: Estimated Creatinine Clearance: 95.9 mL/min (by C-G formula based on SCr of 0.47 mg/dL).  Liver Function Tests: Recent Labs  Lab 10/27/17 0831 10/29/17 0312  ALBUMIN 2.7* 2.6*     Recent Labs  Lab 10/26/17 1511  AMMONIA 26    Cardiac Enzymes: Recent Labs  Lab 10/26/17 0740  CKTOTAL 87    CBG: Recent Labs  Lab 10/29/17 1750 10/29/17 2120 10/30/17 0800 10/30/17 1201 10/30/17 1731  GLUCAP 86 110* 89 101* 88     Recent Results (from the past 240 hour(s))  Culture, Urine     Status: None   Collection Time: 10/25/17 10:47 AM  Result Value Ref Range Status   Specimen Description URINE, CATHETERIZED  Final   Special Requests NONE  Final   Culture   Final    NO GROWTH Performed at [facility], [address], [place], [place] [address]    Report Status 10/26/2017 FINAL  Final      Radiology  Studies: No results found.   Medications:  Scheduled: . chlorhexidine  15 mL Mouth Rinse BID  . Chlorhexidine Gluconate Cloth  6 each Topical Daily  . enoxaparin (LOVENOX) injection  40 mg Subcutaneous Daily  . mouth rinse  15 mL Mouth Rinse q12n4p  . multivitamin with minerals  1 tablet Oral Daily  . sodium chloride flush  10-40 mL Intracatheter Q12H   Continuous: . sodium chloride Stopped (10/23/17 0551)  . dextrose 5 % with KCl 20 mEq / L 20 mEq (10/30/17 1853)   WUJ:[id number], diphenhydrAMINE, lidocaine, metoprolol tartrate, ondansetron (ZOFRAN) IV, RESOURCE THICKENUP CLEAR, sodium chloride flush  Assessment/Plan:    Multifocal pneumonia Patient has completed a course of antibiotics.  Respiratory status is stable.  Continue aspiration precautions.  ARDS-acute respiratory failure with hypoxia Patient was in the intensive care unit.  She was intubated and was on mechanical ventilation.  Her symptoms presentation thought to be secondary to vaping.  Currently stable.  Aspiration risk Speech therapy is following.  She is on a dysphagia diet.  Continue to monitor.  Hyponatremia Resolved  Hypokalemia Repleted.  Normocytic anemia Likely due to acute illness.  Monitor closely.  No evidence of overt bleeding.  DVT Prophylaxis: Lovenox    Code Status: Full code Family Communication: Discussed with the patient Disposition Plan: Management as outlined above.    LOS: 17 days   Stephanie Young  [facility] Pager [phone] 10/31/2017, 1:53 PM  If 7PM-7AM, please contact night-coverage at [email or web address], password [facility]

## 2017-10-31 NOTE — Care Management Note (Addendum)
Case Management Note  Patient Details  Name: Stephanie SchneidersJuliana Young MRN: [id number] Date of Birth: 1990/08/09  Subjective/Objective:From home, has no insurance, she [facility] to the [facility], per cir rep, she is doing to  Well for cir, may need HHPT  At dc, if so it will need to be charity, Lupita LeashDonna with [facility] checking to see if she would qualify for HHPT, NCM gave patient Match letter in case she discharges tomorrow.  Will need HHPT order if needs at dc, check with [facility] to see if she qualifies for charity.      8/26 Stephanie Capeeborah Taesha Goodell RN, BSN - spoke with patient today, concerning CIR rec, patient states she just wants to go home with the charity HHPT and Social work she does not want SNF either.  Waiting to here from Lupita LeashDonna to see if she qualified for charilty.  Patient does qualify for charity for HHPT, social work. Will need orders.                    Action/Plan: DC home when ready.   Expected Discharge Date:                  Expected Discharge Plan:  Home w Home Health Services  In-House Referral:     Discharge planning Services  CM Consult  Post Acute Care Choice:  Home Health Choice offered to:  Patient  DME Arranged:    DME Agency:     HH Arranged:  PT, Social Work [facility]  [facility]  Status of Service:  Completed, signed off  If discussed at [employer] of [employer] Meetings, dates discussed:    Additional Comments:  Stephanie Young, Stephanie Sonneborn [place], RN 10/31/2017, 10:51 AM

## 2017-10-31 NOTE — Progress Notes (Signed)
Inpatient Rehabilitation-Admissions Coordinator   Noted pt progressing well with therapies today, ambulating 400 feet with Mod I. At this time, pt does not require CIR. AC will sign off and communicate with SW/CM regarding dispo needs.   Please call if questions.   Nanine MeansKelly Trinnity Breunig, OTR/L  Rehab Admissions Coordinator  [phone] 10/31/2017 1:10 PM

## 2017-10-31 NOTE — Progress Notes (Signed)
Physical Therapy Treatment Patient Details Name: Stephanie Young MRN: [id number] DOB: November 27, 1990 Today's Date: 10/31/2017    History of Present Illness Pt is a 27 y.o. female admitted 10/13/17 with coughing, SOB and chest pain. 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 ETT 8/11-8/19. No significant PMH on file.    PT Comments    Pt in bed on entry trying to take a nap, however agreeable to ambulation with PT. Pt mod I for mobility and much improved with balance and coordination. Pt wearing slides too big for feet and as a result had shuffling gait to keep shoes on. Pt advised to wear better fitting footwear for ambulation, pt in agreement. Pt also in agreement to ambulating in hallway at least 3x/day to build strength and endurance. After ambulation pt states she is very tired and has felt that way every time after walking. PT currently recommending HHPT to continue to work on strength and progression of gait. PT will continue to follow acutely.     Follow Up Recommendations  Home health PT;Supervision for mobility/OOB     Equipment Recommendations  None recommended by PT    Recommendations for Other Services       Precautions / Restrictions Precautions Precautions: Fall Restrictions Weight Bearing Restrictions: No    Mobility  Bed Mobility Overal bed mobility: Modified Independent                Transfers Overall transfer level: Modified independent                  Ambulation/Gait Ambulation/Gait assistance: Modified independent (Device/Increase time) Gait Distance (Feet): 400 Feet Assistive device: IV Pole Gait Pattern/deviations: Step-through pattern;Shuffle Gait velocity: slowed Gait velocity interpretation: >2.62 ft/sec, indicative of community ambulatory General Gait Details: mod I for gait, pt wearing slides which are too big and she slides her feet along the floor to keep them on, pt educated to wear better fitting shoes to improve  gait pattern and reduce trip/fall risk. Pt reports feeling tired after walking       Balance Overall balance assessment: No apparent balance deficits (not formally assessed)                                          Cognition Arousal/Alertness: Awake/alert Behavior During Therapy: WFL for tasks assessed/performed(tearful) Overall Cognitive Status: Impaired/Different from baseline Area of Impairment: Orientation;Attention                 Orientation Level: (did not know date) Current Attention Level: Selective           General Comments: OT did cognitive testing indicating impairment      Exercises      General Comments General comments (skin integrity, edema, etc.): Pt educated on need to continue to ambulate 3x per day to build strength and endurance for safe mobility at home.       Pertinent Vitals/Pain Pain Assessment: No/denies pain           PT Goals (current goals can now be found in the care plan section) Acute Rehab PT Goals Patient Stated Goal: to eat PT Goal Formulation: With patient Time For Goal Achievement: 11/08/17 Potential to Achieve Goals: Good Progress towards PT goals: Progressing toward goals    Frequency    Min 3X/week      PT Plan Current plan remains appropriate  AM-PAC PT "6 Clicks" Daily Activity  Outcome Measure  Difficulty turning over in bed (including adjusting bedclothes, sheets and blankets)?: None Difficulty moving from lying on back to sitting on the side of the bed? : None Difficulty sitting down on and standing up from a chair with arms (e.g., wheelchair, bedside commode, etc,.)?: None Help needed moving to and from a bed to chair (including a wheelchair)?: None Help needed walking in hospital room?: None Help needed climbing 3-5 steps with a railing? : A Little 6 Click Score: 23    End of Session Equipment Utilized During Treatment: Gait belt Activity Tolerance: Patient limited by  fatigue Patient left: in bed;with call bell/phone within reach Nurse Communication: Mobility status PT Visit Diagnosis: Other abnormalities of gait and mobility (R26.89);Muscle weakness (generalized) (M62.81)     Time: 1610-9604 PT Time Calculation (min) (ACUTE ONLY): 12 min  Charges:  $Gait Training: 8-22 mins                     Stephanie Young PT, DPT Acute Rehabilitation  [phone] Pager [phone]     Stephanie Young 10/31/2017, 12:21 PM

## 2017-10-31 NOTE — Progress Notes (Signed)
  Speech Language Pathology Treatment: Dysphagia  Patient Details Name: Stephanie Young MRN: [id number] DOB: 1990-12-10 Today's Date: 10/31/2017 Time: [id number] SLP Time Calculation (min) (ACUTE ONLY): 22 min  Assessment / Plan / Recommendation Clinical Impression  Pt is much more alert and appropriate compared to this SLP's last visit. Her oral phase appears swift with no anterior loss, residue, or pooling of secretions. Her voice remains significantly hoarse, although she says this does fluctuate some throughout the day. Min cues were given for use of chin tuck. Small sips of water continue to elicit an immediate cough response that was not alleviated by a chin tuck. Head turns did not consistently reduce coughing. Discussed with pt the option of pursuing FEES to reassess swallowing function but also visualize vocal folds. She would like some time to think about it. Recommend continuing current diet for now. SLP to f/u for tolerance, potential to advance, and possible instrumental testing.   HPI HPI: Pt is a 27 yo female with no significant PMH presenting 8/8 with cough, SOB and CP. 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      SLP Plan  Continue with current plan of care       Recommendations  Diet recommendations: Dysphagia 2 (fine chop);Honey-thick liquid Liquids provided via: Cup Medication Administration: Crushed with puree Supervision: Patient able to self feed;Intermittent supervision to cue for compensatory strategies Compensations: Slow rate;Small sips/bites;Lingual sweep for clearance of pocketing Postural Changes and/or Swallow Maneuvers: Seated upright 90 degrees;Upright 30-60 min after meal                Oral Care Recommendations: Oral care BID Follow up Recommendations: Inpatient Rehab SLP Visit Diagnosis: Dysphagia, oropharyngeal phase (R13.12) Plan: Continue with current plan of care       GO                 Maxcine Hamaiewonsky, Daivik Overley 10/31/2017, 10:14 AM  Maxcine HamLaura Paiewonsky, M.A. CCC-SLP [phone]

## 2017-10-31 NOTE — Progress Notes (Signed)
Occupational Therapy Treatment Patient Details Name: Stephanie Young MRN: [id number] DOB: [birth year] Today's Date: 10/31/2017    History of present illness Pt is a 27 y.o. female admitted 10/13/17 with coughing, SOB and chest pain. 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 ETT 8/11-8/19. No significant PMH on file.   OT comments  Cognition assessed using the [facility](MOCA). Pt scored a 25/30 (below 26 is considered impaired). Deficits noted mostly with executive level skills and attention. Pt states how she "wouldn't have as much trouble with something like this before". Discussed how these impairments could affect her with returning to work and managing her finances and medications. Pt states she took Adderall when she was younger and it was helpful. Feel she should discuss ADHD symptoms with her MD and possible medication managment. Pt became tearful during session talking about her vaping and stress she feels at home. Feel pt would greatly benefit form counseling while hospitalized, in addition to counseling on an outpt basis. Will continue to follow acutely.   Follow Up Recommendations  Supervision - Intermittent    Equipment Recommendations  3 in 1 bedside commode    Recommendations for Other Services Other (comment)(SW/counseling)    Precautions / Restrictions Precautions Precautions: Fall       Mobility Bed Mobility Overal bed mobility: Modified Independent                Transfers Overall transfer level: Modified independent                    Balance Overall balance assessment: No apparent balance deficits (not formally assessed)                                         ADL either performed or assessed with clinical judgement   ADL Overall ADL's : Needs assistance/impaired                                     Functional mobility during ADLs: Supervision/safety General ADL  Comments: Overall set up with ADL     Vision       Perception     Praxis      Cognition Arousal/Alertness: Awake/alert Behavior During Therapy: WFL for tasks assessed/performed(tearful) Overall Cognitive Status: Impaired/Different from baseline Area of Impairment: Orientation;Attention                 Orientation Level: (did not know date) Current Attention Level: Selective           General Comments: Assessed using the MOCA, scoring a 25/30. Impairment noted with higher level executive reasoning skills, attention, verbal fluency and delayed recall.         Exercises     Shoulder Instructions       General Comments      Pertinent Vitals/ Pain       Pain Assessment: No/denies pain  Home Living                                          Prior Functioning/Environment              Frequency  Min 3X/week        Progress  Toward Goals  OT Goals(current goals can now be found in the care plan section)  Progress towards OT goals: Progressing toward goals  Acute Rehab OT Goals Patient Stated Goal: to eat OT Goal Formulation: With patient Time For Goal Achievement: 11/10/17 Potential to Achieve Goals: Good ADL Goals Pt Will Perform Eating: with modified independence Pt Will Perform Grooming: with modified independence Pt Will Perform Upper Body Bathing: with modified independence Pt Will Perform Lower Body Bathing: with modified independence Pt Will Transfer to Toilet: with modified independence;ambulating Additional ADL Goal #1: Pt will dmeonstrate anticipatory awareness  Plan Discharge plan needs to be updated    Co-evaluation                 AM-PAC PT "6 Clicks" Daily Activity     Outcome Measure   Help from another person eating meals?: A Little Help from another person taking care of personal grooming?: None Help from another person toileting, which includes using toliet, bedpan, or urinal?: None Help from  another person bathing (including washing, rinsing, drying)?: None Help from another person to put on and taking off regular upper body clothing?: None Help from another person to put on and taking off regular lower body clothing?: None 6 Click Score: 23    End of Session    OT Visit Diagnosis: Muscle weakness (generalized) (M62.81);Other symptoms and signs involving cognitive function   Activity Tolerance Patient tolerated treatment well   Patient Left in bed;with call bell/phone within reach(sitting EOB with nsg)   Nurse Communication Other (comment)(recommend counseling/chaplin)        Time: 1610-9604 OT Time Calculation (min): 38 min  Charges: OT General Charges $OT Visit: 1 Visit OT Treatments $Therapeutic Activity: 38-52 mins  Luisa Dago, OT/L  OT Clinical Specialist [phone]    [facility] 10/31/2017, 11:53 AM

## 2017-11-01 MED ORDER — ALBUTEROL SULFATE HFA 108 (90 BASE) MCG/ACT IN AERS: 2.0000 | INHALATION_SPRAY | Freq: Four times a day (QID) | RESPIRATORY_TRACT | 0 refills | Status: DC | PRN

## 2017-11-01 MED ORDER — ADULT MULTIVITAMIN W/MINERALS CH: 1.0000 | ORAL_TABLET | Freq: Every day | ORAL | 0 refills | Status: AC

## 2017-11-01 NOTE — Discharge Summary (Addendum)
[facility]  Physician Discharge Summary   Patient ID: Stephanie Young MRN: [id number] DOB/AGE: 02-Jul-1990 27 y.o.  Admit date: 10/12/2017 Discharge date: 11/01/2017  PCP: Claiborne Rigg, NP  DISCHARGE DIAGNOSES:  Multifocal pneumonia with sepsis, resolved Hoarse voice likely due to vocal cord injury from intubation Oropharyngeal dysphagia, improving  RECOMMENDATIONS FOR OUTPATIENT FOLLOW UP: 1. Home health has been ordered for swallow evaluation 2. May need outpatient referral to ENT if her hoarseness does not improve in the next few weeks  DISCHARGE CONDITION: fair  Diet recommendation: Dysphagia 3 diet with nectar thick liquids  Filed Weights   10/20/17 0600 10/24/17 0500 10/26/17 0500  Weight: 76.2 kg 70.2 kg 67.1 kg    INITIAL HISTORY: Patient is a 27 year old female with no significant PMH. Patient was admitted on 13 October 2017 with cough, SOB and CP, diagnosed with ARDS with negative blood and respiratory cultures. She had to be transferred to the intensive care unit due to worsening respiratory status.  She was intubated.  Subsequently stabilized, extubated and then transferred out of the ICU.  She has completed a course of antibiotics.  Consultations:  Pulmonology  Infectious disease   HOSPITAL COURSE:   Multifocal pneumonia Patient has completed a course of antibiotics.  Respiratory status is stable.  Continue aspiration precautions.  Incentive spirometry.  ARDS-acute respiratory failure with hypoxia Patient was in the intensive care unit.  She was intubated and was on mechanical ventilation.  Her symptoms presentation thought to be secondary to vaping.    She has significantly improved.  Respiratory status is stable.  Hoarse voice Possibly secondary to intubation.  Appears to be improving.    Patient underwent F EES today.  Left vocal cord was not moving as much of the right according to the speech therapist.  Her hoarseness has been improving.   If it does not get back to baseline in the next few weeks then she may need referral to ENT.  Aspiration risk Patient to be on a dysphagia 3 diet with nectar thick liquids.  Aspiration precautions.  Hyponatremia Resolved  Hypokalemia Repleted.  Normocytic anemia Likely due to acute illness. No evidence of overt bleeding.  Stable.  Okay for discharge home today.  Outpatient follow-up has been arranged.  Home health has been ordered.    PERTINENT LABS:  The results of significant diagnostics from this hospitalization (including imaging, microbiology, ancillary and laboratory) are listed below for reference.    Microbiology: Recent Results (from the past 240 hour(s))  Culture, Urine     Status: None   Collection Time: 10/25/17 10:47 AM  Result Value Ref Range Status   Specimen Description URINE, CATHETERIZED  Final   Special Requests NONE  Final   Culture   Final    NO GROWTH Performed at [facility], [address], [place], [place] [address]    Report Status 10/26/2017 FINAL  Final     Labs: Basic Metabolic Panel: Recent Labs  Lab 10/26/17 0455  10/26/17 1511 10/26/17 2132 10/27/17 0831 10/29/17 0312 10/31/17 0525  NA 152*   < > 152* 146* 146* 131* 138  K 3.3*   < > 3.6 3.9 3.2* 5.0 3.9  CL 117*   < > 117* 110 114* 103 105  CO2 29   < > 29 27 25 22 25   GLUCOSE 130*   < > 157* 335* 141* 355* 95  BUN 35*   < > 34* 28* 23* 11 6  CREATININE 0.58   < >  0.52 0.57 0.55 0.50 0.47  CALCIUM 8.8*   < > 9.1 8.8* 9.1 8.2* 8.9  MG 2.5*  --  2.5*  --  2.2 1.9  --   PHOS 4.2  --   --   --  3.5 3.8  3.7  --    < > = values in this interval not displayed.   Liver Function Tests: Recent Labs  Lab 10/27/17 0831 10/29/17 0312  ALBUMIN 2.7* 2.6*    Recent Labs  Lab 10/26/17 1511  AMMONIA 26   CBC: Recent Labs  Lab 10/26/17 0455 10/27/17 0900 10/29/17 0312 10/31/17 0525  WBC 14.0* 12.1* 10.1 8.6  NEUTROABS  --  8.7* 5.4 3.0  HGB 10.4* 10.1* 9.8*  10.9*  HCT 33.8* 34.0* 31.7* 34.9*  MCV 90.1 91.9 88.1 87.3  PLT 719* 658* 562* 537*   Cardiac Enzymes: Recent Labs  Lab 10/26/17 0740  CKTOTAL 87   CBG: Recent Labs  Lab 10/29/17 1750 10/29/17 2120 10/30/17 0800 10/30/17 1201 10/30/17 1731  GLUCAP 86 110* 89 101* 88     IMAGING STUDIES Dg Chest 2 View  Result Date: 10/15/2017 CLINICAL DATA:  Fever and cough. EXAM: CHEST - 2 VIEW COMPARISON:  October 12, 2017 FINDINGS: The cardiomediastinal silhouette is stable. No pneumothorax. Bilateral patchy pulmonary infiltrates suggest multifocal pneumonia. No other acute abnormalities. IMPRESSION: Multifocal pneumonia, worse on today's chest x-ray compared to the October 12, 2017 x-ray. Electronically Signed   By: Gerome Sam III M.D   On: 10/15/2017 08:44   Dg Chest 2 View  Result Date: 10/12/2017 CLINICAL DATA:  Shortness of breath and productive cough for 4 days. EXAM: CHEST - 2 VIEW COMPARISON:  None. FINDINGS: The heart size and mediastinal contours are within normal limits. There is no focal infiltrate, pulmonary edema, or pleural effusion. The visualized skeletal structures are unremarkable. IMPRESSION: No active cardiopulmonary disease. Electronically Signed   By: Sherian Rein M.D.   On: 10/12/2017 15:27   Ct Chest Wo Contrast  Result Date: 10/26/2017 CLINICAL DATA:  Inpatient.  Cough and dyspnea.  ARDS. EXAM: CT CHEST WITHOUT CONTRAST TECHNIQUE: Multidetector CT imaging of the chest was performed following the standard protocol without IV contrast. COMPARISON:  10/24/2017 chest radiograph.  10/12/2017 chest CT. FINDINGS: Cardiovascular: Normal heart size. No significant pericardial effusion/thickening. Right PICC terminates at the cavoatrial junction. Great vessels are normal in course and caliber. Mediastinum/Nodes: No discrete thyroid nodules. Unremarkable esophagus. No pathologically enlarged axillary, mediastinal or hilar lymph nodes, noting limited sensitivity for the detection  of hilar adenopathy on this noncontrast study. Lungs/Pleura: No pneumothorax. No pleural effusion. There is patchy peribronchovascular consolidation and ground-glass opacity relatively symmetrically involving both lungs, most prominent in the upper lobes, with relative sparing of the peripheral/subpleural lungs. The lower lobe opacities on the 10/12/2017 chest CT have largely resolved. The upper lobe opacities on today's chest CT are more central in distribution compared to the prior chest CT. No discrete lung masses. Subpleural anterior left lower lobe 5 mm solid pulmonary nodule (series 5/image 61), stable, probably benign. No new significant pulmonary nodules. No significant regions of bronchiectasis, architectural distortion or frank honeycombing. Upper abdomen: No acute abnormality. Musculoskeletal:  No aggressive appearing focal osseous lesions. IMPRESSION: Persistent patchy peribronchovascular consolidation and ground-glass opacity symmetrically involving both lungs, most prominent in the upper lobes, with relative sparing of the peripheral/subpleural lungs. Lung opacities are in a different distribution compared to the 10/12/2017 chest CT. Favor evolution of acute interstitial pneumonia/ARDS. Differential includes diffuse alveolar  hemorrhage such as from vasculitis, drug toxicity or atypical infection. Electronically Signed   By: Delbert PhenixJason A Poff M.D.   On: 10/26/2017 14:31   Ct Angio Chest Pe W/cm &/or Wo Cm  Result Date: 10/12/2017 CLINICAL DATA:  Shortness of breath and cough for 4 days. EXAM: CT ANGIOGRAPHY CHEST WITH CONTRAST TECHNIQUE: Multidetector CT imaging of the chest was performed using the standard protocol during bolus administration of intravenous contrast. Multiplanar CT image reconstructions and MIPs were obtained to evaluate the vascular anatomy. CONTRAST:  100mL ISOVUE-370 IOPAMIDOL (ISOVUE-370) INJECTION 76% COMPARISON:  Chest radiograph October 12, 2017 FINDINGS: CARDIOVASCULAR: Suboptimal  contrast opacification of the pulmonary artery's (main pulmonary artery is 120 Hounsfield units, target is 250 Hounsfield units). Main pulmonary artery is not enlarged. No pulmonary arterial filling defects to the level of the lobar branches. Admixture of contrast and respiratory motion limits sensitivity for segmental and subsegmental emboli heart size is normal. No pericardial effusion. Thoracic aorta is normal course and caliber, unremarkable. MEDIASTINUM/NODES: No lymphadenopathy by CT size criteria. LUNGS/PLEURA: Tracheobronchial tree is patent, no pneumothorax. Multifocal dense consolidation bilateral lungs, confident in the lower lobes. No pleural effusion. UPPER ABDOMEN: Included view of the abdomen is unremarkable. MUSCULOSKELETAL: Visualized soft tissues and included osseous structures appear normal. Review of the MIP images confirms the above findings. IMPRESSION: 1. No acute central pulmonary embolus. 2. Multifocal pneumonia. Electronically Signed   By: Awilda Metroourtnay  Bloomer M.D.   On: 10/12/2017 19:53   Dg Chest Port 1 View  Result Date: 10/24/2017 CLINICAL DATA:  Endotracheal tube placement. EXAM: PORTABLE CHEST 1 VIEW COMPARISON:  Radiograph October 23, 2017. FINDINGS: The heart size and mediastinal contours are within normal limits. Endotracheal and nasogastric tubes are unchanged in position. Right-sided PICC line is again noted with tip in expected position of the right atrium. No pneumothorax or pleural effusion is noted. Stable bilateral lung opacities are noted concerning for edema or inflammation. The visualized skeletal structures are unremarkable. IMPRESSION: Stable support apparatus. Stable bilateral lung opacities as described above. Electronically Signed   By: Lupita RaiderJames  Green Jr, M.D.   On: 10/24/2017 07:24   Dg Chest Port 1 View  Result Date: 10/23/2017 CLINICAL DATA:  Respiratory failure. EXAM: PORTABLE CHEST 1 VIEW COMPARISON:  10/22/2017 FINDINGS: An endotracheal tube with tip 2 cm  above the carina, NG tube coiled in stomach and RIGHT PICC line with tip overlying the RIGHT atrium noted. Slightly decreased lung volumes with slightly increased bilateral airspace opacities noted. There is no evidence of pneumothorax. IMPRESSION: Slightly decreased lung volumes with slightly increased bilateral airspace opacities/edema. Electronically Signed   By: Harmon PierJeffrey  Hu M.D.   On: 10/23/2017 14:21   Dg Chest Port 1 View  Result Date: 10/22/2017 CLINICAL DATA:  Endotracheal tube placement. EXAM: PORTABLE CHEST 1 VIEW COMPARISON:  10/21/2017 and 10/12/2017 FINDINGS: Nasogastric tube is coiled once over the stomach with tip and side-port within the stomach in the left upper quadrant. Endotracheal tube has tip 3.1 cm above the carina. Right-sided PICC line unchanged with tip just below the cavoatrial junction. Lungs are adequately inflated with persistent hazy patchy interstitial prominence over the mid to lower lungs unchanged. No lobar consolidation or effusion. Cardiomediastinal silhouette and remainder the exam is unchanged. IMPRESSION: Stable hazy interstitial prominence over the mid to lower lungs. Tubes and lines as described. Electronically Signed   By: Elberta Fortisaniel  Boyle M.D.   On: 10/22/2017 08:04   Dg Chest Port 1 View  Result Date: 10/21/2017 CLINICAL DATA:  ET tube EXAM:  PORTABLE CHEST 1 VIEW COMPARISON:  10/20/2017 FINDINGS: Support devices are stable. Endotracheal tube is 4 cm above the carina. Heart is normal size. Mild interstitial prominence throughout the lungs, with further improvement since prior study. No effusions or acute bony abnormality. IMPRESSION: Continued interstitial prominence throughout the lungs, slightly improved since prior study. Electronically Signed   By: Charlett Nose M.D.   On: 10/21/2017 10:15   Dg Chest Port 1 View  Result Date: 10/20/2017 CLINICAL DATA:  Shortness of breath.  ETT. EXAM: PORTABLE CHEST 1 VIEW COMPARISON:  October 19, 2017 FINDINGS: The ETT is in  good position. The right PICC line has been pulled back and terminates in the central SVC. No pneumothorax. Diffuse bilateral pulmonary opacities persist but have improved. No other interval changes. The NG tube terminates in the stomach. IMPRESSION: 1. Support apparatus as above. 2. Persistent but improving diffuse bilateral pulmonary opacities. Electronically Signed   By: Gerome Sam III M.D   On: 10/20/2017 07:04   Dg Chest Port 1 View  Result Date: 10/19/2017 CLINICAL DATA:  Endotracheal tube present. EXAM: PORTABLE CHEST 1 VIEW COMPARISON:  Radiograph of October 18, 2017. FINDINGS: Stable cardiomediastinal silhouette. Endotracheal tube is less than 1 cm above the carina. Nasogastric tube is seen coiled within proximal stomach. No pneumothorax is noted. Stable bilateral lung airspace opacities are noted concerning for pneumonia or possibly edema. Right-sided PICC line is noted with tip in right atrium. Bony thorax is unremarkable. IMPRESSION: Stable bilateral diffuse airspace opacities are noted concerning for pneumonia or possibly edema. Endotracheal tube is less than 1 cm above the carina; withdrawal by 2-3 cm is recommended. Right-sided PICC line is noted with tip in right atrium; withdrawal by 3-4 cm is recommended. These results will be called to the ordering clinician or representative by the Radiologist Assistant, and communication documented in the PACS or zVision Dashboard. Electronically Signed   By: Lupita Raider, M.D.   On: 10/19/2017 07:19   Dg Chest Port 1 View  Result Date: 10/18/2017 CLINICAL DATA:  Intubation. EXAM: PORTABLE CHEST 1 VIEW COMPARISON:  10/17/2017. FINDINGS: Right PICC line noted with tip at the cavoatrial junction/upper right atrium. Endotracheal tube and NG tube in stable position. Heart size stable. Diffuse bilateral airspace disease again noted. Similar findings on prior exam. No pleural effusion or pneumothorax. IMPRESSION: 1. Right PICC line noted with tip at the  cavoatrial junction/upper right atrium. 2.  Endotracheal tube and NG tube in stable position. 3. Bilateral diffuse airspace disease again noted. Similar findings noted on prior exam. Electronically Signed   By: Maisie Fus  Register   On: 10/18/2017 06:18   Dg Chest Port 1 View  Result Date: 10/17/2017 CLINICAL DATA:  Acute respiratory failure.  Shortness of breath. EXAM: PORTABLE CHEST 1 VIEW COMPARISON:  10/16/2017 and prior radiographs FINDINGS: An endotracheal tube is again identified with tip 1.8 cm above the carina. An NG tube enters the stomach. Bilateral airspace opacities are unchanged. No large pleural effusion or pneumothorax. IMPRESSION: Unchanged appearance the chest with bilateral airspace opacities and support apparatus. Electronically Signed   By: Harmon Pier M.D.   On: 10/17/2017 07:10   Dg Chest Port 1 View  Result Date: 10/16/2017 CLINICAL DATA:  Airway intubation EXAM: PORTABLE CHEST 1 VIEW COMPARISON:  10/15/2017 FINDINGS: New endotracheal tube with tip between the clavicular heads and carina. Tube tip is approximately 2.5 cm above the carina. An orogastric tube is coiled in the stomach. Bilateral airspace disease. No Kerley lines, effusion, or  pneumothorax. Normal heart size. IMPRESSION: 1. New endotracheal and orogastric tubes in good position. 2. Symmetric extensive airspace disease suspicious for ARDS, possibly from underlying pneumonia or acute interstitial pneumonia. Electronically Signed   By: Marnee Spring M.D.   On: 10/16/2017 13:32   Dg Swallowing Func-speech Pathology  Result Date: 10/26/2017 Objective Swallowing Evaluation: Type of Study: MBS-Modified Barium Swallow Study  Patient Details Name: Dorcus Riga MRN: [id number] Date of Birth: 10/30/1990 Today's Date: 10/26/2017 Time: SLP Start Time (ACUTE ONLY): 1409 -SLP Stop Time (ACUTE ONLY): 1439 SLP Time Calculation (min) (ACUTE ONLY): 30 min Past Medical History: Past Medical History: Diagnosis Date . H. pylori infection  .  Kidney stones  Past Surgical History: Past Surgical History: Procedure Laterality Date . dental procedure   HPI: Pt is a 27 yo female with no significant PMH presenting 8/8 with cough, SOB and CP. 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  Subjective: pt alert, tearful, seems anxious Assessment / Plan / Recommendation CHL IP CLINICAL IMPRESSIONS 10/26/2017 Clinical Impression Pt has a moderate-severe oropharyngeal dysphagia due to generalized weakness and oral deficits that do not appear consistent with a typical post-extbation dysphagia as well as suspected sensory changes and incomplete airway closure that is more common after a prolonged intubation. She needs Mod cues for oral opening for adequate bolus acceptance, but incomplete labial seals also results in anterior spillage throughout the study of liquids and saliva. Her posterior propulsion is slow with intermittent tongue pumping. There is residue on the tongue and in the valleculae with all consistencies, which is reduced with spontaneous second swallow. Her hyolaryngeal movement is reduced, as is her epiglottic deflection, base of tongue retraction, and pharyngeal squeeze. Airway compromise occurs during the swallow with nectar and honey thick liquids. Aspiration is silent, and she cannot produce a strong volitional cough for clearance. A chin tuck helps to improve airway protection with honey thick liquids and reduced vallecular residue with purees. UES relaxation appears reduced with intermittent backflow observed inthe cervical esophagus. One instance of backflow into the larynx occured after she swallowed her first bite of puree, but this did trigger a sensed cough that was productive of clearing aspirates. Given her weak cough, overall fatigue, and poor secretion management, would favor starting more conservatively. Instead of full meal trays, would allow her to have snacks that consist of small bites of puree and  small sips of honey thick liquids. Full supervision should be used, and pt should tuck her chin with each swallow. SLP will continue to follow for potential to advance.  SLP Visit Diagnosis Dysphagia, oropharyngeal phase (R13.12) Attention and concentration deficit following -- Frontal lobe and executive function deficit following -- Impact on safety and function Moderate aspiration risk;Severe aspiration risk   CHL IP TREATMENT RECOMMENDATION 10/26/2017 Treatment Recommendations Therapy as outlined in treatment plan below   Prognosis 10/26/2017 Prognosis for Safe Diet Advancement Good Barriers to Reach Goals Severity of deficits Barriers/Prognosis Comment -- CHL IP DIET RECOMMENDATION 10/26/2017 SLP Diet Recommendations Dysphagia 1 (Puree) solids;Honey thick liquids Liquid Administration via Cup;Spoon;No straw Medication Administration Via alternative means Compensations Slow rate;Small sips/bites;Monitor for anterior loss;Multiple dry swallows after each bite/sip;Chin tuck Postural Changes Remain semi-upright after after feeds/meals (Comment);Seated upright at 90 degrees   CHL IP OTHER RECOMMENDATIONS 10/26/2017 Recommended Consults -- Oral Care Recommendations Oral care QID Other Recommendations Order thickener from pharmacy;Prohibited food (jello, ice cream, thin soups);Have oral suction available;Remove water pitcher   CHL IP FOLLOW UP RECOMMENDATIONS 10/26/2017  Follow up Recommendations Inpatient Rehab   CHL IP FREQUENCY AND DURATION 10/26/2017 Speech Therapy Frequency (ACUTE ONLY) min 2x/week Treatment Duration 2 weeks      CHL IP ORAL PHASE 10/26/2017 Oral Phase Impaired Oral - Pudding Teaspoon -- Oral - Pudding Cup -- Oral - Honey Teaspoon Left anterior bolus loss;Right anterior bolus loss;Weak lingual manipulation;Reduced posterior propulsion;Lingual/palatal residue;Delayed oral transit;Decreased bolus cohesion Oral - Honey Cup Left anterior bolus loss;Right anterior bolus loss;Weak lingual manipulation;Reduced  posterior propulsion;Lingual/palatal residue;Delayed oral transit;Decreased bolus cohesion Oral - Nectar Teaspoon Left anterior bolus loss;Right anterior bolus loss;Weak lingual manipulation;Reduced posterior propulsion;Lingual/palatal residue;Delayed oral transit;Decreased bolus cohesion Oral - Nectar Cup -- Oral - Nectar Straw -- Oral - Thin Teaspoon -- Oral - Thin Cup -- Oral - Thin Straw -- Oral - Puree Left anterior bolus loss;Right anterior bolus loss;Weak lingual manipulation;Reduced posterior propulsion;Lingual/palatal residue;Delayed oral transit;Decreased bolus cohesion Oral - Mech Soft -- Oral - Regular -- Oral - Multi-Consistency -- Oral - Pill -- Oral Phase - Comment --  CHL IP PHARYNGEAL PHASE 10/26/2017 Pharyngeal Phase Impaired Pharyngeal- Pudding Teaspoon -- Pharyngeal -- Pharyngeal- Pudding Cup -- Pharyngeal -- Pharyngeal- Honey Teaspoon Reduced pharyngeal peristalsis;Reduced epiglottic inversion;Reduced anterior laryngeal mobility;Reduced laryngeal elevation;Reduced airway/laryngeal closure;Pharyngeal residue - valleculae;Penetration/Aspiration during swallow;Compensatory strategies attempted (with notebox) Pharyngeal Material enters airway, remains ABOVE vocal cords and not ejected out Pharyngeal- Honey Cup Reduced pharyngeal peristalsis;Reduced epiglottic inversion;Reduced anterior laryngeal mobility;Reduced laryngeal elevation;Reduced airway/laryngeal closure;Pharyngeal residue - valleculae;Compensatory strategies attempted (with notebox);Penetration/Aspiration during swallow Pharyngeal Material enters airway, remains ABOVE vocal cords then ejected out Pharyngeal- Nectar Teaspoon Reduced pharyngeal peristalsis;Reduced epiglottic inversion;Reduced anterior laryngeal mobility;Reduced laryngeal elevation;Reduced airway/laryngeal closure;Pharyngeal residue - valleculae;Penetration/Aspiration during swallow;Pharyngeal residue - pyriform;Compensatory strategies attempted (with notebox) Pharyngeal  Material enters airway, passes BELOW cords without attempt by patient to eject out (silent aspiration) Pharyngeal- Nectar Cup -- Pharyngeal -- Pharyngeal- Nectar Straw -- Pharyngeal -- Pharyngeal- Thin Teaspoon -- Pharyngeal -- Pharyngeal- Thin Cup -- Pharyngeal -- Pharyngeal- Thin Straw -- Pharyngeal -- Pharyngeal- Puree Reduced pharyngeal peristalsis;Reduced epiglottic inversion;Reduced anterior laryngeal mobility;Reduced laryngeal elevation;Reduced airway/laryngeal closure;Pharyngeal residue - valleculae;Penetration/Apiration after swallow Pharyngeal Material enters airway, passes BELOW cords then ejected out Pharyngeal- Mechanical Soft -- Pharyngeal -- Pharyngeal- Regular -- Pharyngeal -- Pharyngeal- Multi-consistency -- Pharyngeal -- Pharyngeal- Pill -- Pharyngeal -- Pharyngeal Comment --  CHL IP CERVICAL ESOPHAGEAL PHASE 10/26/2017 Cervical Esophageal Phase Impaired Pudding Teaspoon -- Pudding Cup -- Honey Teaspoon Reduced cricopharyngeal relaxation Honey Cup Reduced cricopharyngeal relaxation;Esophageal backflow into cervical esophagus Nectar Teaspoon Reduced cricopharyngeal relaxation;Esophageal backflow into cervical esophagus Nectar Cup -- Nectar Straw -- Thin Teaspoon -- Thin Cup -- Thin Straw -- Puree Reduced cricopharyngeal relaxation;Esophageal backflow into the larynx Mechanical Soft -- Regular -- Multi-consistency -- Pill -- Cervical Esophageal Comment -- Maxcine Ham 10/26/2017, 3:48 PM  Maxcine Ham, M.A. CCC-SLP [phone]             [place] Ekg Site Rite  Result Date: 10/17/2017 If Site Rite image not attached, placement could not be confirmed due to current cardiac rhythm.   DISCHARGE EXAMINATION: See progress note from earlier today  DISPOSITION: Home  Discharge Instructions    Call MD for:  difficulty breathing, headache or visual disturbances   Complete by:  As directed    Call MD for:  extreme fatigue   Complete by:  As directed    Call MD for:  persistant dizziness or  light-headedness   Complete by:  As directed    Call MD for:  persistant nausea and vomiting  Complete by:  As directed    Call MD for:  severe uncontrolled pain   Complete by:  As directed    Call MD for:  temperature >100.4   Complete by:  As directed    Discharge instructions   Complete by:  As directed    Please consume dysphagia 3 diet with nectar thick liquids for now.  If your voice hoarseness does not improve in the next few weeks your outpatient providers may need to refer you to ENT specialist.  You were cared for by a hospitalist during your hospital stay. If you have any questions about your discharge medications or the care you received while you were in the hospital after you are discharged, you can call the unit and asked to speak with the hospitalist on call if the hospitalist that took care of you is not available. Once you are discharged, your primary care physician will handle any further medical issues. Please note that NO REFILLS for any discharge medications will be authorized once you are discharged, as it is imperative that you return to your primary care physician (or establish a relationship with a primary care physician if you do not have one) for your aftercare needs so that they can reassess your need for medications and monitor your lab values. If you do not have a primary care physician, you can call [phone] for a physician referral.   Increase activity slowly   Complete by:  As directed         Allergies as of 11/01/2017      Reactions   Cephalosporins Rash   Cefepime      Medication List    STOP taking these medications   hydrocortisone 2.5 % rectal cream Commonly known as:  ANUSOL-HC     TAKE these medications   acetaminophen 500 MG tablet Commonly known as:  TYLENOL Take 1,000 mg by mouth every 6 (six) hours as needed for moderate pain.   albuterol 108 (90 Base) MCG/ACT inhaler Commonly known as:  PROVENTIL HFA;VENTOLIN HFA Inhale 2 puffs  into the lungs every 6 (six) hours as needed for wheezing or shortness of breath.   dicyclomine 10 MG capsule Commonly known as:  BENTYL Take 1 capsule (10 mg total) by mouth 4 (four) times daily -  before meals and at bedtime. As needed for abdominal cramping and loose stool   multivitamin with minerals Tabs tablet Take 1 tablet by mouth daily. Start taking on:  11/02/2017   tiZANidine 4 MG tablet Commonly known as:  ZANAFLEX Take 1 tablet (4 mg total) by mouth every 6 (six) hours as needed for muscle spasms.            Durable Medical Equipment  (From admission, onward)         Start     Ordered   11/01/17 1238  For home use only DME 3 n 1  Once     11/01/17 1238           Follow-up Information    [facility] Follow up on 11/21/2017.   Why:   2 :10 for hospital folow up Contact information: [address] [place] [address] [phone]       [facility] Follow up.   Specialty:  Home Health Services Why:  HHPT, HHSocial Work Solicitor information: [address] [place] [address] [phone]           TOTAL DISCHARGE  TIME: 35 minutes  Osvaldo Shipper  [facility] Pager [phone]  11/01/2017, 2:24 PM

## 2017-11-01 NOTE — Discharge Instructions (Signed)
Dysphagia Dysphagia is trouble swallowing. This condition occurs when solids and liquids stick in a person's throat on the way down to the stomach, or when food takes longer to get to the stomach. You may have problems swallowing food, liquids, or both. You may also have pain while trying to swallow. It may take you more time and effort to swallow something. What are the causes? This condition is caused by:  Problems with the muscles. They may make it difficult for you to move food and liquids through the tube that connects your mouth to your stomach (esophagus). You may have ulcers, scar tissue, or inflammation that blocks the normal passage of food and liquids. Causes of these problems include: ? Acid reflux from your stomach into your esophagus (gastroesophageal reflux). ? Infections. ? Radiation treatment for cancer. ? Medicines taken without enough fluids to wash them down into your stomach.  Nerve problems. These prevent signals from being sent to the muscles of your esophagus to squeeze (contract) and move what you swallow down to your stomach.  Globus pharyngeus. This is a common problem that involves feeling like something is stuck in the throat or a sense of trouble with swallowing even though nothing is wrong with the swallowing passages.  Stroke. This can affect the nerves and make it difficult to swallow.  Certain conditions, such as cerebral palsy or Parkinson disease.  What are the signs or symptoms? Common symptoms of this condition include:  A feeling that solids or liquids are stuck in your throat on the way down to the stomach.  Food taking too long to get to the stomach.  Other symptoms include:  Food moving back from your stomach to your mouth (regurgitation).  Noises coming from your throat.  Chest discomfort with swallowing.  A feeling of fullness when swallowing.  Drooling, especially when the throat is blocked.  Pain while  swallowing.  Heartburn.  Coughing or gagging while trying to swallow.  How is this diagnosed? This condition is diagnosed by:  Barium X-ray. In this test, you swallow a white substance (contrast medium)that sticks to the inside of your esophagus. X-ray images are then taken.  Endoscopy. In this test, a flexible telescope is inserted down your throat to look at your esophagus and your stomach.  CT scans and MRI.  How is this treated? Treatment for dysphagia depends on the cause of the condition:  If the dysphagia is caused by acid reflux or infection, medicines may be used. They may include antibiotics and heartburn medicines.  If the dysphagia is caused by problems with your muscles, swallowing therapy may be used to help you strengthen your swallowing muscles. You may have to do specific exercises to strengthen the muscles or stretch them.  If the dysphagia is caused by a blockage or mass, procedures to remove the blockage may be done. You may need surgery and a feeding tube.  You may need to make diet changes. Ask your health care provider for specific instructions. Follow these instructions at home: Eating and drinking  Try to eat soft food that is easier to swallow.  Follow any diet changes as told by your health care provider.  Cut your food into small pieces and eat slowly.  Eat and drink only when you are sitting upright.  Do not drink alcohol or caffeine. If you need help quitting, ask your health care provider. General instructions  Check your weight every day to make sure you are not losing weight.  Take over-the-counter  and prescription medicines only as told by your health care provider.  If you were prescribed an antibiotic medicine, take it as told by your health care provider. Do not stop taking the antibiotic even if you start to feel better.  Do not use any products that contain nicotine or tobacco, such as cigarettes and e-cigarettes. If you need help  quitting, ask your health care provider.  Keep all follow-up visits as told by your health care provider. This is important. Contact a health care provider if:  You lose weight because you cannot swallow.  You cough when you drink liquids (aspiration).  You cough up partially digested food. Get help right away if:  You cannot swallow your saliva.  You have shortness of breath or a fever, or both.  You have a hoarse voice and also have trouble swallowing. Summary  Dysphagia is trouble swallowing. This condition occurs when solids and liquids stick in a person's throat on the way down to the stomach, or when food takes longer to get to the stomach.  Dysphagia has many possible causes and symptoms.  Treatment for dysphagia depends on the cause of the condition. This information is not intended to replace advice given to you by your health care provider. Make sure you discuss any questions you have with your health care provider. Document Released: 02/20/2000 Document Revised: 02/12/2016 Document Reviewed: 02/12/2016 Elsevier Interactive Patient Education  2017 [employer]   Dysphagia Diet Level 3, Mechanically Advanced The dysphagia level 3 diet includes foods that are soft, moist, and can be chopped into 1-inch chunks. This diet is helpful for people with mild swallowing difficulties. It reduces the risk of food getting caught in the windpipe, trachea, or lungs. What do I need to know about this diet?  You may eat foods that are soft and moist.  If you were on the dysphagia level 1 or level 2 diets, you may eat any of the foods included on those lists.  Avoid foods that are dry, hard, sticky, chewy, coarse, and crunchy. Also avoid large cuts of food.  Take small bites. Each bite should contain 1 inch or less of food.  Thicken liquids if instructed by your health care provider. Follow your health care provider's instructions on how to do this and to what consistency.  See  your dietitian or speech language pathologist regularly for help with your dietary changes. What foods can I eat? Grains Moist breads without nuts or seeds. Biscuits, muffins, pancakes, and waffles well-moistened with syrup, jelly, margarine, or butter. Smooth cereals with plenty of milk to moisten them. Moist bread stuffing. Moist rice. Vegetables All cooked, soft vegetables. Shredded lettuce. Tender fried potatoes. Fruits All canned and cooked fruits. Soft, peeled fresh fruits, such as peaches, nectarines, kiwis, cantaloupe, honeydew melon, and watermelon without seeds. Soft berries, such as strawberries. Meat and Other Protein Sources Moist ground or finely diced or sliced meats. Solid, tender cuts of meat. Meatloaf. Hamburger with a bun. Sausage patty. Deli thin-sliced lunch meat. Chicken, egg, or tuna salad sandwich. Sloppy joe. Moist fish. Eggs prepared any way. Casseroles with small chunks of meats, ground meats, or tender meats. Dairy Cheese spreads without coarse large chunks. Shredded cheese. Cheese slices. Cottage cheese. Milk at the right texture. Smooth frappes. Yogurt without nuts or coconut. Ask your health care provider whether you can have frozen desserts (such as malts or milk shakes) and thin liquids. Sweets/Desserts Soft, smooth, moist desserts. Non-chewy, smooth candy. Jam. Jelly. Honey. Preserves. Ask your health  care provider whether you can have frozen desserts. Fats and Oils Butter. Oils. Margarine. Mayonnaise. Gravy. Spreads. Other All seasonings and sweeteners. All sauces without large chunks. The items listed above may not be a complete list of recommended foods or beverages. Contact your dietitian for more options. What foods are not recommended? Grains Coarse or dry cereals. Dry breads. Toast. Crackers. Tough, crusty breads, such French bread and baguettes. Tough, crisp fried potatoes. Potato skins. Dry bread stuffing. Granola. Popcorn. Chips. Vegetables All raw  vegetables except shredded lettuce. Cooked corn. Rubbery or stiff cooked vegetables. Stringy vegetables, such as celery. Fruits Hard fruits that are difficult to chew, such as apples or pears. Stringy, high-pulp fruits, such as pineapple, papaya, or mango. Fruits with tough skins, such as grapes. Coconut. All dried fruits. Fruit leather. Fruit roll-ups. Fruit snacks. Meat and Other Protein Sources Dry or tough meats or poultry. Dry fish. Fish with bones. Peanut butter. All nuts and seeds. Dairy Any with nuts, seeds, chocolate chips, dried fruit, coconut, or pineapple. Sweets/Desserts Dry cakes. Chewy or dry cookies. Any with nuts, seeds, dry fruits, coconut, pineapple, or anything dry, sticky, or hard. Chewy caramel. Licorice. Taffy-type candies. Ask your health care provider whether you can have frozen desserts. Fats and Oils Any with chunks, nuts, seeds, or pineapple. Olives. Rosita Fire. Other Soups with tough or large chunks of meats, poultry, or vegetables. Corn or clam chowder. The items listed above may not be a complete list of foods and beverages to avoid. Contact your dietitian for more information. This information is not intended to replace advice given to you by your health care provider. Make sure you discuss any questions you have with your health care provider. Document Released: 02/22/2005 Document Revised: 07/31/2015 Document Reviewed: 02/05/2013 Elsevier Interactive Patient Education  [employer].

## 2017-11-01 NOTE — Progress Notes (Signed)
  Speech Language Pathology Treatment: Dysphagia  Patient Details Name: Stephanie Young MRN: [id number] DOB: 09/13/1990 Today's Date: 11/01/2017 Time: [id number] SLP Time Calculation (min) (ACUTE ONLY): 27 min  Assessment / Plan / Recommendation Clinical Impression  Pt continues to cough with thin liquids with only intermittent relief given Min cues to attempt postural maneuvers. Her voice sounds mildly improved from previous date. Today she is agreeable to participating in FEES. Will plan for completion this afternoon to assess vocal cords, oropharyngeal function, and potential for diet advancement.   HPI HPI: Pt is a 27 yo female with no significant PMH presenting 8/8 with cough, SOB and CP. 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      SLP Plan  Other (Comment)(FEES)       Recommendations  Diet recommendations: Dysphagia 2 (fine chop);Honey-thick liquid Liquids provided via: Cup Medication Administration: Crushed with puree Supervision: Patient able to self feed;Intermittent supervision to cue for compensatory strategies Compensations: Slow rate;Small sips/bites;Lingual sweep for clearance of pocketing Postural Changes and/or Swallow Maneuvers: Seated upright 90 degrees;Upright 30-60 min after meal                Oral Care Recommendations: Oral care BID Follow up Recommendations: Home health SLP SLP Visit Diagnosis: Dysphagia, oropharyngeal phase (R13.12) Plan: Other (Comment)(FEES)       GO                Stephanie Young, Stephanie Young 11/01/2017, 11:23 AM  Stephanie Young, M.A. CCC-SLP [phone]

## 2017-11-01 NOTE — Progress Notes (Signed)
Physical Therapy Treatment Patient Details Name: Johnanna SchneidersJuliana Morales MRN: [id number] DOB: Nov 01, 1990 Today's Date: 11/01/2017    History of Present Illness Pt is a 27 y.o. female admitted 10/13/17 with coughing, SOB and chest pain. 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 ETT 8/11-8/19. No significant PMH on file.    PT Comments    Pt is excited about discharging this afternoon and is willing to ambulate with PT prior to leaving. Pt is now mod I for all mobility and is able to ambulate without AD. Pt no longer has any instability with gait. PT encouraged pt to continue ambulation multiple times a day to build strength and endurance. PT will continue to follow if pt does not d/c today.     Follow Up Recommendations  Supervision - Intermittent;Home health PT     Equipment Recommendations  None recommended by PT    Recommendations for Other Services       Precautions / Restrictions Precautions Precautions: Fall Restrictions Weight Bearing Restrictions: No    Mobility  Bed Mobility Overal bed mobility: Modified Independent                Transfers Overall transfer level: Modified independent                  Ambulation/Gait Ambulation/Gait assistance: Modified independent (Device/Increase time) Gait Distance (Feet): 800 Feet Assistive device: None Gait Pattern/deviations: Step-through pattern;WFL(Within Functional Limits) Gait velocity: slowed Gait velocity interpretation: >2.62 ft/sec, indicative of community ambulatory General Gait Details: mod I for safety, able to ambulate without assist, no SoB, no LoB,        Balance Overall balance assessment: No apparent balance deficits (not formally assessed)                                          Cognition Arousal/Alertness: Awake/alert Behavior During Therapy: WFL for tasks assessed/performed(tearful) Overall Cognitive Status: Impaired/Different from baseline Area of  Impairment: Attention                                         General Comments General comments (skin integrity, edema, etc.): Pt encouraged to continue mobility once she d/c's home to rebuild strength and endurance.       Pertinent Vitals/Pain Pain Assessment: No/denies pain           PT Goals (current goals can now be found in the care plan section) Acute Rehab PT Goals Patient Stated Goal: to eat PT Goal Formulation: With patient Time For Goal Achievement: 11/08/17 Potential to Achieve Goals: Good Progress towards PT goals: Progressing toward goals    Frequency    Min 3X/week      PT Plan Current plan remains appropriate       AM-PAC PT "6 Clicks" Daily Activity  Outcome Measure  Difficulty turning over in bed (including adjusting bedclothes, sheets and blankets)?: None Difficulty moving from lying on back to sitting on the side of the bed? : None Difficulty sitting down on and standing up from a chair with arms (e.g., wheelchair, bedside commode, etc,.)?: None Help needed moving to and from a bed to chair (including a wheelchair)?: None Help needed walking in hospital room?: None Help needed climbing 3-5 steps with a railing? : A Little 6 Click Score: 23  End of Session Equipment Utilized During Treatment: Gait belt Activity Tolerance: Patient limited by fatigue Patient left: in bed;with call bell/phone within reach Nurse Communication: Mobility status PT Visit Diagnosis: Other abnormalities of gait and mobility (R26.89);Muscle weakness (generalized) (M62.81)     Time: 1610-9604 PT Time Calculation (min) (ACUTE ONLY): 18 min  Charges:  $Gait Training: 8-22 mins                     Grabiela Wohlford B. Beverely Risen PT, DPT Acute Rehabilitation  [phone] Pager [phone]     Elon Alas Fleet 11/01/2017, 3:05 PM

## 2017-11-01 NOTE — Progress Notes (Signed)
Patient is ready for discharge. She has all of her belongings. Patient has had all of her questions regarding her discharge satisfied. Her PICC Line has been removed by IV team and she has been taken off of the telemetry monitor and CCMD notified. Patient will be transported home by her mother and boyfriend who are currently here with her. She will leave unit by wheelchair and meet family at the front entrance.

## 2017-11-01 NOTE — Progress Notes (Signed)
Nutrition Follow-up  DOCUMENTATION CODES:   Not applicable  INTERVENTION:    Magic cup TID with meals, each supplement provides 290 kcal and 9 grams of protein  NEW NUTRITION DIAGNOSIS:   Increased nutrient needs now related to acute illness as evidenced by estimated needs, ongoing  GOAL:   Patient will meet greater than or equal to 90% of their needs, met  MONITOR:   PO intake, Supplement acceptance, Labs, Skin, Weight trends, I & O's  ASSESSMENT:   27 y/o female admitted with PNA and ARDS (suspect related to vaping); no significant PMH.  8/19 extubated  Pt transferred from [facility] to [facility] 8/22. S/p MBSS 8/21. SLP recommending Dysphagia 1-honey thick liquids. Dysphagia treatment yesterday. Advanced to Dysphagia 2 (fine chop).  Spoke with Jenny Reichmann, Therapist, sports. Pt is hungry and eating well. For FEES today. Receiving Magic Cup dessert supplements on meal trays TID. Labs and medications reviewed.  CBG's [id number].  Diet Order:   Diet Order            DIET DYS 2 Room service appropriate? Yes; Fluid consistency: Honey Thick  Diet effective now             EDUCATION NEEDS:   No education needs have been identified at this time  Skin:  Skin Assessment: (MASD to groin & thigh)  Last BM:  8/26  Height:   Ht Readings from Last 1 Encounters:  10/13/17 5' 5"  (1.651 m)   Weight:   Wt Readings from Last 1 Encounters:  10/26/17 67.1 kg   Ideal Body Weight:  56.8 kg  BMI:  Body mass index is 24.62 kg/m.  Estimated Nutritional Needs:   Kcal:  1850-2050  Protein:  80-100 gm  Fluid:  2 L  Arthur Holms, RD, LDN Pager #: [phone] After-Hours Pager #: [phone]

## 2017-11-01 NOTE — Progress Notes (Signed)
[facility] PROGRESS NOTE  Lessly Stigler FIE:[id number] DOB: 01/24/1991 DOA: 10/12/2017  PCP: Claiborne Rigg, NP  Brief History/Interval Summary:  Patient is a 27 year old female with no significant PMH.  Patient was admitted on 13 October 2017 with cough, SOB and CP, diagnosed with ARDS with negative blood and respiratory cultures. She had to be transferred to the intensive care unit due to worsening respiratory status.  She was intubated.  Subsequently stabilized, extubated and then transferred out of the ICU.  She has completed a course of antibiotics.  Consultants: Neurology.  Infectious disease.  Procedures: None  Antibiotics: Completed course of antibiotics  Subjective/Interval History: Patient continues to feel better.  Continues to have a hoarse voice but it has improved since yesterday.  Has been ambulating.    ROS: Denies any shortness of breath or chest pain..  Objective:  Vital Signs  Vitals:   10/31/17 1603 10/31/17 1920 10/31/17 2327 11/01/17 0812  BP: 108/73 99/61 (!) 99/59 108/81  Pulse: (!) 107 95 94 92  Resp: 20 18 18    Temp: 99.1 F (37.3 C) 99 F (37.2 C) 98.8 F (37.1 C) 99 F (37.2 C)  TempSrc: Oral Oral Oral Oral  SpO2: 100% 100% 98% 99%  Weight:      Height:        Intake/Output Summary (Last 24 hours) at 11/01/2017 1345 Last data filed at 11/01/2017 0600 Gross per 24 hour  Intake 1183 ml  Output -  Net 1183 ml   Filed Weights   10/20/17 0600 10/24/17 0500 10/26/17 0500  Weight: 76.2 kg 70.2 kg 67.1 kg    General appearance:.  In no distress Resp: Normal effort.  Good air entry bilaterally.  No wheezing or rhonchi. Cardio: 2 is normal regular.  No S3-S4.  No rubs murmurs or bruit GI: Abdomen is soft.  Nontender nondistended Neurologic: No focal deficits  Lab Results:  Data Reviewed: I have personally reviewed following labs and imaging studies  CBC: Recent Labs  Lab 10/26/17 0455 10/27/17 0900 10/29/17 0312  10/31/17 0525  WBC 14.0* 12.1* 10.1 8.6  NEUTROABS  --  8.7* 5.4 3.0  HGB 10.4* 10.1* 9.8* 10.9*  HCT 33.8* 34.0* 31.7* 34.9*  MCV 90.1 91.9 88.1 87.3  PLT 719* 658* 562* 537*    Basic Metabolic Panel: Recent Labs  Lab 10/26/17 0455  10/26/17 1511 10/26/17 2132 10/27/17 0831 10/29/17 0312 10/31/17 0525  NA 152*   < > 152* 146* 146* 131* 138  K 3.3*   < > 3.6 3.9 3.2* 5.0 3.9  CL 117*   < > 117* 110 114* 103 105  CO2 29   < > 29 27 25 22 25   GLUCOSE 130*   < > 157* 335* 141* 355* 95  BUN 35*   < > 34* 28* 23* 11 6  CREATININE 0.58   < > 0.52 0.57 0.55 0.50 0.47  CALCIUM 8.8*   < > 9.1 8.8* 9.1 8.2* 8.9  MG 2.5*  --  2.5*  --  2.2 1.9  --   PHOS 4.2  --   --   --  3.5 3.8  3.7  --    < > = values in this interval not displayed.    GFR: Estimated Creatinine Clearance: 95.9 mL/min (by C-G formula based on SCr of 0.47 mg/dL).  Liver Function Tests: Recent Labs  Lab 10/27/17 0831 10/29/17 0312  ALBUMIN 2.7* 2.6*     Recent Labs  Lab 10/26/17 1511  AMMONIA 26    Cardiac Enzymes: Recent Labs  Lab 10/26/17 0740  CKTOTAL 87    CBG: Recent Labs  Lab 10/29/17 1750 10/29/17 2120 10/30/17 0800 10/30/17 1201 10/30/17 1731  GLUCAP 86 110* 89 101* 88     Recent Results (from the past 240 hour(s))  Culture, Urine     Status: None   Collection Time: 10/25/17 10:47 AM  Result Value Ref Range Status   Specimen Description URINE, CATHETERIZED  Final   Special Requests NONE  Final   Culture   Final    NO GROWTH Performed at [facility], [address] [place], [place] [address]    Report Status 10/26/2017 FINAL  Final      Radiology Studies: No results found.   Medications:  Scheduled: . chlorhexidine  15 mL Mouth Rinse BID  . Chlorhexidine Gluconate Cloth  6 each Topical Daily  . enoxaparin (LOVENOX) injection  40 mg Subcutaneous Daily  . mouth rinse  15 mL Mouth Rinse q12n4p  . multivitamin with minerals  1 tablet Oral Daily  . sodium  chloride flush  10-40 mL Intracatheter Q12H   Continuous: . sodium chloride Stopped (10/23/17 0551)  . dextrose 5 % with KCl 20 mEq / L 20 mEq (11/01/17 0236)   UEA:[id number]:acetaminophen, alum & mag hydroxide-simeth, diphenhydrAMINE, lidocaine, metoprolol tartrate, ondansetron (ZOFRAN) IV, RESOURCE THICKENUP CLEAR, sodium chloride flush  Assessment/Plan:    Multifocal pneumonia Patient has completed a course of antibiotics.  Respiratory status is stable.  Continue aspiration precautions.  Incentive spirometry.  ARDS-acute respiratory failure with hypoxia Patient was in the intensive care unit.  She was intubated and was on mechanical ventilation.  Her symptoms presentation thought to be secondary to vaping.  Currently stable.  Hoarse voice Possibly secondary to intubation.  Appears to be improving.  Patient to undergo FEES today.  Aspiration risk Speech therapy is following.  She is on a dysphagia diet.  Continue to monitor. Patient to undergo FEES today  Hyponatremia Resolved  Hypokalemia Repleted.  Normocytic anemia Likely due to acute illness.  Monitor closely.  No evidence of overt bleeding.  DVT Prophylaxis: Lovenox    Code Status: Full code Family Communication: Discussed with the patient Disposition Plan: Patient has made significant progress.  Patient to undergo FEES today.  Hopefully discharge afterwards if no significant findings noted.    LOS: 18 days   Osvaldo ShipperGokul Sherolyn Trettin  [facility] Pager [phone] 11/01/2017, 1:45 PM  If 7PM-7AM, please contact night-coverage at [email or web address], password [email or web address]

## 2017-11-01 NOTE — Procedures (Signed)
Objective Swallowing Evaluation: Type of Study: FEES-Fiberoptic Endoscopic Evaluation of Swallow   Patient Details  Name: Stephanie Young MRN: [id number] Date of Birth: 09/25/1990  Today's Date: 11/01/2017 Time: SLP Start Time (ACUTE ONLY): 1342 -SLP Stop Time (ACUTE ONLY): 1413  SLP Time Calculation (min) (ACUTE ONLY): 31 min   Past Medical History:  Past Medical History:  Diagnosis Date  . H. pylori infection   . Kidney stones    Past Surgical History:  Past Surgical History:  Procedure Laterality Date  . dental procedure     HPI: Pt is a 27 yo female with no significant PMH presenting 8/8 with cough, SOB and CP. 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   Subjective: pt alert, excited to go home    Assessment / Plan / Recommendation  CHL IP CLINICAL IMPRESSIONS 11/01/2017  Clinical Impression Pt has improved swallow function since initial testing, although persistent, mild pharyngeal dysphagia secondary to weakness and decreased airway closure post-extubation. Her L true vocal fold has limited adduction, allowing thin and nectar thick liquids to enter into the airway at the height of the swallow. Thin liquids are aspirated, with a cough response elicited, although aspirates are not ejected from the trachea. Nectar thick liquids are penetrated without spontaneous attempts at clearance, but Min cues for a throat clear are succesful at clearing the laryngeal vestibule. Multiple postures were attempted to facilitate airway protection, including head turns, chin tucks, and various combinations of the two, without complete airway closure achieved. However, a chin tuck with nectar thick liquids did reduce the amount of penetration that occurred. Mild residue remains in the valleculae, lateral channels, and pyriform sinuses with nectar thick liquids and solids. Recommend advancement to Dys 3 diet and nectar thick liquids, still using a chin tuck and  throat clear after each sip. HH SLP will be beneficial to maximize swallow function.  SLP Visit Diagnosis Dysphagia, oropharyngeal phase (R13.12)  Attention and concentration deficit following --  Frontal lobe and executive function deficit following --  Impact on safety and function Mild aspiration risk;Moderate aspiration risk      CHL IP TREATMENT RECOMMENDATION 11/01/2017  Treatment Recommendations Therapy as outlined in treatment plan below     Prognosis 11/01/2017  Prognosis for Safe Diet Advancement Good  Barriers to Reach Goals --  Barriers/Prognosis Comment --    CHL IP DIET RECOMMENDATION 11/01/2017  SLP Diet Recommendations Dysphagia 3 (Mech soft) solids;Nectar thick liquid  Liquid Administration via Cup;Straw  Medication Administration Whole meds with puree  Compensations Slow rate;Small sips/bites;Chin tuck;Clear throat after each swallow  Postural Changes Seated upright at 90 degrees      CHL IP OTHER RECOMMENDATIONS 11/01/2017  Recommended Consults Consider ENT evaluation;Other (Comment)  Oral Care Recommendations Oral care BID  Other Recommendations --      CHL IP FOLLOW UP RECOMMENDATIONS 11/01/2017  Follow up Recommendations Home health SLP      CHL IP FREQUENCY AND DURATION 11/01/2017  Speech Therapy Frequency (ACUTE ONLY) min 2x/week  Treatment Duration 2 weeks           CHL IP ORAL PHASE 11/01/2017  Oral Phase WFL  Oral - Pudding Teaspoon --  Oral - Pudding Cup --  Oral - Honey Teaspoon --  Oral - Honey Cup --  Oral - Nectar Teaspoon --  Oral - Nectar Cup --  Oral - Nectar Straw --  Oral - Thin Teaspoon --  Oral - Thin Cup --  Oral - Thin  Straw --  Oral - Puree --  Oral - Mech Soft --  Oral - Regular --  Oral - Multi-Consistency --  Oral - Pill --  Oral Phase - Comment --    CHL IP PHARYNGEAL PHASE 11/01/2017  Pharyngeal Phase Impaired  Pharyngeal- Pudding Teaspoon --  Pharyngeal --  Pharyngeal- Pudding Cup --  Pharyngeal --  Pharyngeal-  Honey Teaspoon NT  Pharyngeal --  Pharyngeal- Honey Cup NT  Pharyngeal --  Pharyngeal- Nectar Teaspoon NT  Pharyngeal --  Pharyngeal- Nectar Cup Reduced airway/laryngeal closure;Pharyngeal residue - valleculae;Lateral channel residue;Penetration/Aspiration during swallow  Pharyngeal Material enters airway, CONTACTS cords and not ejected out  Pharyngeal- Nectar Straw --  Pharyngeal --  Pharyngeal- Thin Teaspoon --  Pharyngeal --  Pharyngeal- Thin Cup Reduced airway/laryngeal closure;Penetration/Aspiration during swallow  Pharyngeal Material enters airway, passes BELOW cords and not ejected out despite cough attempt by patient  Pharyngeal- Thin Straw Reduced airway/laryngeal closure;Penetration/Aspiration during swallow  Pharyngeal Material enters airway, passes BELOW cords and not ejected out despite cough attempt by patient  Pharyngeal- Puree Reduced airway/laryngeal closure;Pharyngeal residue - valleculae;Lateral channel residue  Pharyngeal Material does not enter airway  Pharyngeal- Mechanical Soft Reduced airway/laryngeal closure;Pharyngeal residue - valleculae;Lateral channel residue  Pharyngeal --  Pharyngeal- Regular --  Pharyngeal --  Pharyngeal- Multi-consistency --  Pharyngeal --  Pharyngeal- Pill --  Pharyngeal --  Pharyngeal Comment --     CHL IP CERVICAL ESOPHAGEAL PHASE 11/01/2017  Cervical Esophageal Phase WFL  Pudding Teaspoon --  Pudding Cup --  Honey Teaspoon --  Honey Cup --  Nectar Teaspoon --  Nectar Cup --  Nectar Straw --  Thin Teaspoon --  Thin Cup --  Thin Straw --  Puree --  Mechanical Soft --  Regular --  Multi-consistency --  Pill --  Cervical Esophageal Comment --     Maxcine Hamaiewonsky, Samona Chihuahua 11/01/2017, 3:07 PM   Maxcine HamLaura Paiewonsky, M.A. CCC-SLP [phone]

## 2017-11-01 NOTE — Progress Notes (Signed)
  Speech Language Pathology Treatment: Dysphagia  Patient Details Name: Stephanie SchneidersJuliana Morales MRN: [id number] DOB: [birth year] Today's Date: 11/01/2017 Time: [id number] SLP Time Calculation (min) (ACUTE ONLY): 17 min  Assessment / Plan / Recommendation Clinical Impression  SLP returned to initiate EMST/IMST to maximize swallow function and cough. Trainers were provided and calibrated at bedside based on perceived effort levels and clinical performance (both at approx. 30 cm H20). Min cues and demonstrations were needed for pt to complete 5 sets of 5 repetitions of IMST; 3 sets of 5 repetitions of EMST. Of note, pt had also just been up walking the halls with PT and reported feeling tired. Education was reinforced about thickening liquids, recommendations for home, and f/u recommendations in light of pt d/c home today. [facility] with consideration for ENT consult if vocal quality and swallowing function were to plateau.   HPI HPI: Pt is a 27 yo female with no significant PMH presenting 8/8 with cough, SOB and CP. 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      SLP Plan  Continue with current plan of care       Recommendations  Diet recommendations: Dysphagia 3 (mechanical soft);Nectar-thick liquid Liquids provided via: Cup;Straw Medication Administration: Whole meds with puree Supervision: Patient able to self feed;Intermittent supervision to cue for compensatory strategies Compensations: Slow rate;Small sips/bites;Chin tuck;Clear throat after each swallow Postural Changes and/or Swallow Maneuvers: Seated upright 90 degrees;Upright 30-60 min after meal                Oral Care Recommendations: Oral care BID Follow up Recommendations: Home health SLP SLP Visit Diagnosis: Dysphagia, oropharyngeal phase (R13.12) Plan: Continue with current plan of care       GO                Maxcine Hamaiewonsky, Laurencia Roma 11/01/2017, 3:11 PM  Maxcine HamLaura  Paiewonsky, M.A. CCC-SLP [phone]

## 2017-11-10 ENCOUNTER — Telehealth: Payer: Self-pay | Admitting: Nurse Practitioner

## 2017-11-10 NOTE — Telephone Encounter (Signed)
[facility] Revonda Standard called requesting verbal order approval for One additional visit to address her voice.

## 2017-11-10 NOTE — Telephone Encounter (Signed)
Revonda Standard from [facility] called for verbal orders to see patient again to see her about her voice. Please follow up with Revonda Standard from [facility].

## 2017-11-11 NOTE — Telephone Encounter (Signed)
Agreed.  Thank you

## 2017-11-14 NOTE — Telephone Encounter (Signed)
CMA spoke to Orange from [facility] and inform PCP approved verbal order that was requested.

## 2017-11-16 LAB — FUNGUS CULTURE WITH STAIN

## 2017-11-16 LAB — FUNGAL ORGANISM REFLEX

## 2017-11-16 LAB — FUNGUS CULTURE RESULT

## 2017-11-21 ENCOUNTER — Ambulatory Visit: Payer: Self-pay | Attending: Nurse Practitioner | Admitting: Nurse Practitioner

## 2017-11-21 ENCOUNTER — Encounter: Payer: Self-pay | Admitting: Nurse Practitioner

## 2017-11-21 VITALS — BP 135/88 | HR 71 | Temp 98.8°F | Ht 64.0 in | Wt 150.8 lb

## 2017-11-21 DIAGNOSIS — M545 Low back pain: Secondary | ICD-10-CM | POA: Insufficient documentation

## 2017-11-21 DIAGNOSIS — R42 Dizziness and giddiness: Secondary | ICD-10-CM | POA: Insufficient documentation

## 2017-11-21 DIAGNOSIS — T148XXA Other injury of unspecified body region, initial encounter: Secondary | ICD-10-CM

## 2017-11-21 DIAGNOSIS — M791 Myalgia, unspecified site: Secondary | ICD-10-CM | POA: Insufficient documentation

## 2017-11-21 DIAGNOSIS — Z79899 Other long term (current) drug therapy: Secondary | ICD-10-CM | POA: Insufficient documentation

## 2017-11-21 DIAGNOSIS — Z87442 Personal history of urinary calculi: Secondary | ICD-10-CM | POA: Insufficient documentation

## 2017-11-21 DIAGNOSIS — N926 Irregular menstruation, unspecified: Secondary | ICD-10-CM | POA: Insufficient documentation

## 2017-11-21 DIAGNOSIS — R200 Anesthesia of skin: Secondary | ICD-10-CM | POA: Insufficient documentation

## 2017-11-21 DIAGNOSIS — D649 Anemia, unspecified: Secondary | ICD-10-CM | POA: Insufficient documentation

## 2017-11-21 DIAGNOSIS — M419 Scoliosis, unspecified: Secondary | ICD-10-CM | POA: Insufficient documentation

## 2017-11-21 DIAGNOSIS — J189 Pneumonia, unspecified organism: Secondary | ICD-10-CM | POA: Insufficient documentation

## 2017-11-21 LAB — POCT URINE PREGNANCY: PREG TEST UR: NEGATIVE

## 2017-11-21 MED ORDER — TIZANIDINE HCL 4 MG PO TABS: 4.0000 mg | ORAL_TABLET | Freq: Four times a day (QID) | ORAL | 1 refills | Status: DC | PRN

## 2017-11-21 MED ORDER — NAPROXEN 500 MG PO TABS: 500.0000 mg | ORAL_TABLET | Freq: Two times a day (BID) | ORAL | 0 refills | Status: DC

## 2017-11-21 NOTE — Patient Instructions (Signed)
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
## 2017-11-30 ENCOUNTER — Ambulatory Visit: Payer: Self-pay | Admitting: Gastroenterology

## 2017-11-30 ENCOUNTER — Encounter

## 2017-11-30 LAB — ACID FAST CULTURE WITH REFLEXED SENSITIVITIES (MYCOBACTERIA): Acid Fast Culture: NEGATIVE

## 2017-12-02 ENCOUNTER — Ambulatory Visit: Payer: Self-pay | Admitting: Gastroenterology

## 2017-12-07 ENCOUNTER — Ambulatory Visit: Payer: Self-pay | Attending: Nurse Practitioner

## 2017-12-07 DIAGNOSIS — J189 Pneumonia, unspecified organism: Secondary | ICD-10-CM | POA: Insufficient documentation

## 2017-12-07 NOTE — Addendum Note (Signed)
Addended by: Paschal Dopp on: 12/07/2017 04:35 PM   Modules accepted: Orders

## 2017-12-07 NOTE — Addendum Note (Signed)
Addended by: Paschal Dopp on: 12/07/2017 04:38 PM   Modules accepted: Orders

## 2017-12-07 NOTE — Progress Notes (Signed)
Patient here for lab visit only 

## 2017-12-08 ENCOUNTER — Encounter: Payer: Self-pay | Admitting: Nurse Practitioner

## 2017-12-08 LAB — CBC WITH DIFFERENTIAL/PLATELET
Basophils Absolute: 0.1 10*3/uL (ref 0.0–0.2)
Basos: 1 %
EOS (ABSOLUTE): 0.2 10*3/uL (ref 0.0–0.4)
EOS: 2 %
HEMATOCRIT: 38.9 % (ref 34.0–46.6)
Hemoglobin: 12.8 g/dL (ref 11.1–15.9)
Immature Grans (Abs): 0 10*3/uL (ref 0.0–0.1)
Immature Granulocytes: 0 %
LYMPHS ABS: 3.6 10*3/uL — AB (ref 0.7–3.1)
Lymphs: 40 %
MCH: 28.4 pg (ref 26.6–33.0)
MCHC: 32.9 g/dL (ref 31.5–35.7)
MCV: 86 fL (ref 79–97)
MONOS ABS: 0.6 10*3/uL (ref 0.1–0.9)
Monocytes: 6 %
Neutrophils Absolute: 4.7 10*3/uL (ref 1.4–7.0)
Neutrophils: 51 %
Platelets: 442 10*3/uL (ref 150–450)
RBC: 4.51 x10E6/uL (ref 3.77–5.28)
RDW: 13.8 % (ref 12.3–15.4)
WBC: 9.1 10*3/uL (ref 3.4–10.8)

## 2017-12-10 ENCOUNTER — Encounter: Payer: Self-pay | Admitting: Nurse Practitioner

## 2017-12-13 ENCOUNTER — Other Ambulatory Visit (HOSPITAL_COMMUNITY): Payer: Self-pay | Admitting: *Deleted

## 2017-12-13 DIAGNOSIS — N644 Mastodynia: Secondary | ICD-10-CM

## 2017-12-14 ENCOUNTER — Ambulatory Visit (INDEPENDENT_AMBULATORY_CARE_PROVIDER_SITE_OTHER): Payer: Self-pay | Admitting: Gastroenterology

## 2017-12-14 ENCOUNTER — Encounter: Payer: Self-pay | Admitting: Gastroenterology

## 2017-12-14 VITALS — BP 114/72 | HR 63 | Temp 97.8°F | Ht 64.0 in | Wt 154.4 lb

## 2017-12-14 DIAGNOSIS — K594 Anal spasm: Secondary | ICD-10-CM

## 2017-12-14 MED ORDER — HYDROCORTISONE 2.5 % RE CREA: TOPICAL_CREAM | RECTAL | 1 refills | Status: DC

## 2017-12-14 NOTE — Patient Instructions (Signed)
I sent in the cream to [facility]. They will call you when ready. It should be about 5pm when it's ready!  We have arranged a flexible sigmoidoscopy to better assess your rectum and sigmoid area.   Further recommendations to follow!  I enjoyed seeing you again today! As you know, I value our relationship and want to provide genuine, compassionate, and quality care. I welcome your feedback. If you receive a survey regarding your visit,  I greatly appreciate you taking time to fill this out. See you next time!  Gelene Mink, PhD, ANP-BC [facility]

## 2017-12-14 NOTE — Progress Notes (Signed)
Referring Provider: Claiborne Rigg, NP Primary Care Physician:  Claiborne Rigg, NP Primary GI: Dr. Jena Gauss    Chief Complaint  Patient presents with  . Hemorrhoids    painful currently, not bleeding. Used cream but does not help    HPI:   Stephanie Young is a 27 y.o. female presenting today with a history of abdominal pain and loose stool. Last seen in July 2019. Occasional sharp rectal pain unprovoked. Asked to call if continued.   Rectal pain for several minutes twice a day, knife like. Daily. Worsening since discharge from hospital in Aug 2019. No rectal bleeding. No changes in bowel habits. No pain with defecation.   In interim from last seen, she was inpatient with multifocal pneumonia, ARDS, intubated. Felt secondary to vaping. She is doing well currently.   Past Medical History:  Diagnosis Date  . H. pylori infection   . Kidney stones     Past Surgical History:  Procedure Laterality Date  . dental procedure      Current Outpatient Medications  Medication Sig Dispense Refill  . acetaminophen (TYLENOL) 500 MG tablet Take 1,000 mg by mouth every 6 (six) hours as needed for moderate pain.     . folic acid (FOLVITE) 1 MG tablet Take 1 mg by mouth daily.    . Multiple Vitamin (MULTIVITAMIN WITH MINERALS) TABS tablet Take 1 tablet by mouth daily. 30 tablet 0  . naproxen (NAPROSYN) 500 MG tablet Take 1 tablet (500 mg total) by mouth 2 (two) times daily with a meal. (Patient taking differently: Take 500 mg by mouth as needed. ) 60 tablet 0  . tiZANidine (ZANAFLEX) 4 MG tablet Take 1 tablet (4 mg total) by mouth every 6 (six) hours as needed for muscle spasms. 60 tablet 1  . vitamin B-12 (CYANOCOBALAMIN) 500 MCG tablet Take 500 mcg by mouth daily.    Marland Kitchen albuterol (PROVENTIL HFA;VENTOLIN HFA) 108 (90 Base) MCG/ACT inhaler Inhale 2 puffs into the lungs every 6 (six) hours as needed for wheezing or shortness of breath. (Patient not taking: Reported on 12/14/2017) 1 Inhaler 0    No current facility-administered medications for this visit.     Allergies as of 12/14/2017 - Review Complete 12/14/2017  Allergen Reaction Noted  . Cephalosporins Rash 10/29/2017    Family History  Problem Relation Age of Onset  . Breast cancer Mother 85  . Hypertension Mother   . Hypertension Brother   . Diabetes Maternal Uncle   . Diabetes Other   . Colon cancer Neg Hx   . Colon polyps Neg Hx     Social History   Socioeconomic History  . Marital status: Single    Spouse name: Not on file  . Number of children: Not on file  . Years of education: Not on file  . Highest education level: Not on file  Occupational History  . Not on file  Social Needs  . Financial resource strain: Not on file  . Food insecurity:    Worry: Not on file    Inability: Not on file  . Transportation needs:    Medical: Not on file    Non-medical: Not on file  Tobacco Use  . Smoking status: Former Games developer  . Smokeless tobacco: Never Used  Substance and Sexual Activity  . Alcohol use: Yes    Comment: socially   . Drug use: Yes    Types: Marijuana    Comment: socially  . Sexual activity: Yes  Partners: Male  Lifestyle  . Physical activity:    Days per week: Not on file    Minutes per session: Not on file  . Stress: Not on file  Relationships  . Social connections:    Talks on phone: Not on file    Gets together: Not on file    Attends religious service: Not on file    Active member of club or organization: Not on file    Attends meetings of clubs or organizations: Not on file    Relationship status: Not on file  Other Topics Concern  . Not on file  Social History Narrative  . Not on file    Review of Systems: Gen: Denies fever, chills, anorexia. Denies fatigue, weakness, weight loss.  CV: Denies chest pain, palpitations, syncope, peripheral edema, and claudication. Resp: Denies dyspnea at rest, cough, wheezing, coughing up blood, and pleurisy. GI:see HPI  Derm: Denies  rash, itching, dry skin Psych: Denies depression, anxiety, memory loss, confusion. No homicidal or suicidal ideation.  Heme: Denies bruising, bleeding, and enlarged lymph nodes.  Physical Exam: BP 114/72   Pulse 63   Temp 97.8 F (36.6 C) (Oral)   Ht 5\' 4"  (1.626 m)   Wt 154 lb 6.4 oz (70 kg)   LMP 11/26/2017   BMI 26.50 kg/m  General:   Alert and oriented. No distress noted. Pleasant and cooperative.  Head:  Normocephalic and atraumatic. Eyes:  Conjuctiva clear without scleral icterus. Mouth:  Oral mucosa pink and moist.  Lungs: clear bilaterally Cardiac: S1 S2 present without murmurs  Abdomen:  +BS, soft, non-tender and non-distended. No rebound or guarding. No HSM or masses noted. Rectal: no obvious fissure, small external non-thrombosed hemorrhoids, internal exam difficult due to patient discomfort Msk:  Symmetrical without gross deformities. Normal posture. Extremities:  Without edema. Neurologic:  Alert and  oriented x4 Psych:  Alert and cooperative. Normal mood and affect.

## 2017-12-14 NOTE — Assessment & Plan Note (Signed)
27 year old female with history of rectal pain unprovoked intermittently in the past, now worsening to daily and several minutes at a time with abatement of symptoms thereafter. Affecting quality of life. No improvement with Anusol that was given several months ago. Rectal exam difficult due discomfort but no obvious fissure. Denies rectal pain, pain with defecation, or changes in bowel habits.  I have called in [facility] cream to be compounded with lidocaine, 0.2% nitro. Use 4 times per day. I feel she needs a flex sig, will hold off on complete colonoscopy as no other symptoms.   Proceed with flex sig with Dr. Jena Gauss in near future: the risks, benefits, and alternatives have been discussed with the patient in detail. The patient states understanding and desires to proceed. Propofol Further recommendations to follow

## 2017-12-15 NOTE — Progress Notes (Signed)
cc'ed to pcp °

## 2017-12-15 NOTE — Telephone Encounter (Signed)
Gm We can refer to Stephanie Young here in [facility] but patient need to be seen by pcp the same month that [name] is on call  October or  December .

## 2017-12-22 ENCOUNTER — Other Ambulatory Visit: Payer: Self-pay | Admitting: Nurse Practitioner

## 2017-12-22 ENCOUNTER — Encounter: Payer: Self-pay | Admitting: Neurology

## 2017-12-22 DIAGNOSIS — R42 Dizziness and giddiness: Secondary | ICD-10-CM

## 2018-01-02 ENCOUNTER — Telehealth: Payer: Self-pay | Admitting: *Deleted

## 2018-01-02 ENCOUNTER — Other Ambulatory Visit: Payer: Self-pay | Admitting: *Deleted

## 2018-01-02 DIAGNOSIS — K6289 Other specified diseases of anus and rectum: Secondary | ICD-10-CM

## 2018-01-02 NOTE — Telephone Encounter (Signed)
Spoke with patient and flex sig w/ MAC is scheduled for 01/30/18 at 1:45pm. Instructions mailed to patient. She asked if I would send her a [facility] message regarding pre-op appt. Will do so.

## 2018-01-02 NOTE — Telephone Encounter (Signed)
Pre-op scheduled for 11/20 at 2:45pm. [facility] message sent per pt request.

## 2018-01-12 ENCOUNTER — Ambulatory Visit (HOSPITAL_COMMUNITY)
Admission: RE | Admit: 2018-01-12 | Discharge: 2018-01-12 | Disposition: A | Discharge status: Home / Self Care | Payer: Self-pay | Source: Ambulatory Visit | Attending: Obstetrics and Gynecology | Admitting: Obstetrics and Gynecology

## 2018-01-12 ENCOUNTER — Encounter (HOSPITAL_COMMUNITY): Payer: Self-pay

## 2018-01-12 ENCOUNTER — Ambulatory Visit
Admission: RE | Admit: 2018-01-12 | Discharge: 2018-01-12 | Disposition: A | Discharge status: Home / Self Care | Payer: No Typology Code available for payment source | Source: Ambulatory Visit | Attending: Obstetrics and Gynecology | Admitting: Obstetrics and Gynecology

## 2018-01-12 ENCOUNTER — Encounter: Payer: Self-pay | Admitting: Nurse Practitioner

## 2018-01-12 VITALS — BP 118/80 | Ht 65.0 in | Wt 157.0 lb

## 2018-01-12 DIAGNOSIS — N6452 Nipple discharge: Secondary | ICD-10-CM | POA: Insufficient documentation

## 2018-01-12 DIAGNOSIS — Z1239 Encounter for other screening for malignant neoplasm of breast: Secondary | ICD-10-CM

## 2018-01-12 DIAGNOSIS — N644 Mastodynia: Secondary | ICD-10-CM

## 2018-01-12 NOTE — Addendum Note (Signed)
Encounter addended by: Lynnell Dike, LPN on: 40/11/8117 9:27 AM  Actions taken: Order list changed

## 2018-01-12 NOTE — Patient Instructions (Addendum)
Explained breast self awareness with Stephanie Young. Patient did not need a Pap smear today due to last Pap smear was 08/23/2017. Let her know [facility] will cover Pap smears every 3 years unless has a history of abnormal Pap smears. Referred patient to the [facility] for a right breast ultrasound. Appointment scheduled for Thursday, January 12, 2018 at 1030. Patient aware of appointment and will be there. Let patient know will follow up with her within the next couple weeks with results of right breast discharge by phone. Stephanie Young verbalized understanding.  Stephanie Young, Kathaleen Maser, RN 8:36 AM

## 2018-01-12 NOTE — Progress Notes (Signed)
Complaints of right breast stabbing pain x 5 days that resolved around a month ago. Patient stated the pain was constant. Patient rated the pain at a 8 out of 10. Patient stated during that time her right breast appeared swollen and her vein was more visible. Patient complained of spontaneous right breast milky discharge.  Pap Smear: Pap smear not completed today. Last Pap smear was 08/23/2017 at [facility] and normal. Per patient has no history of an abnormal Pap smear. Last Pap smear result is in Epic.  Physical exam: Breasts Breasts symmetrical. No skin abnormalities bilateral breasts. No nipple retraction bilateral breasts. No nipple discharge left breast. Expressed a milky colored discharge from the right breast on exam. Sample of discharge sent to cytology for evaluation. No lymphadenopathy. No lumps palpated bilateral breasts. Complaints of tenderness right nipple area on exam. Referred patient to the [facility] for a right breast ultrasound. Appointment scheduled for Thursday, January 12, 2018 at 1030.        Pelvic/Bimanual No Pap smear completed today since last Pap smear was 08/23/2017. Pap smear not indicated per [facility] guidelines.   Smoking History: Patient is a former smoker that quit in August 2019.  Patient Navigation: Patient education provided. Access to services provided for patient through [facility] program.   Breast and Cervical Cancer Risk Assessment: Patient has a family history of her mothering having breast cancer. Patient has no known genetic mutations or history of radiation treatment to the chest before age 50. Patient has no history of cervical dysplasia, immunocompromised, or DES exposure in-utero. Breast Cancer risk assessment completed. No risk calculated due to patient is less than 33 years old.

## 2018-01-13 ENCOUNTER — Encounter (HOSPITAL_COMMUNITY): Payer: Self-pay | Admitting: *Deleted

## 2018-01-19 ENCOUNTER — Other Ambulatory Visit: Payer: Self-pay | Admitting: Nurse Practitioner

## 2018-01-19 DIAGNOSIS — N921 Excessive and frequent menstruation with irregular cycle: Secondary | ICD-10-CM

## 2018-01-20 ENCOUNTER — Telehealth (HOSPITAL_COMMUNITY): Payer: Self-pay | Admitting: *Deleted

## 2018-01-20 NOTE — Telephone Encounter (Signed)
Patient returned my phone. Explained to patient that her breast discharge was negative. Patient verbalized understanding.

## 2018-01-20 NOTE — Telephone Encounter (Signed)
Attempted to call patient to give breast discharge result. No one answered phone. Left voicemail for patient to call me back.

## 2018-01-21 NOTE — Patient Instructions (Signed)
Stephanie Young  01/21/2018     @PREFPERIOPPHARMACY @   Your procedure is scheduled on  01/30/2018 .  Report to [facility] at  1200   P.M.  Call this number if you have problems the morning of surgery:  [phone]   Remember:  Follow the diet and prep instructions given to you by Dr Luvenia Starch office.                       Take these medicines the morning of surgery with A SIP OF WATER  Zanaflex( if needed).   Do not wear jewelry, make-up or nail polish.  Do not wear lotions, powders, or perfumes, or deodorant.  Do not shave 48 hours prior to surgery.  Men may shave face and neck.  Do not bring valuables to the hospital.  [facility] is not responsible for any belongings or valuables.  Contacts, dentures or bridgework may not be worn into surgery.  Leave your suitcase in the car.  After surgery it may be brought to your room.  For patients admitted to the hospital, discharge time will be determined by your treatment team.  Patients discharged the day of surgery will not be allowed to drive home.   Name and phone number of your driver:   family Special instructions:  None  Please read over the following fact sheets that you were given. Anesthesia Post-op Instructions and Care and Recovery After Surgery       Flexible Sigmoidoscopy Flexible sigmoidoscopy is a procedure to check the lower colon (sigmoid colon). The procedure is done using a short, flexible tube that has a small camera attached (sigmoidoscope). The sigmoidoscope is inserted into the anus and passed through the rectum into the sigmoid colon. The camera on the scope sends images to a TV monitor in the exam room. You may need this exam if you have had changes in your bowel habits, bleeding from your rectum, or pain in your abdomen. You may also need this test if your health care provider wants to check for abnormal growths in your rectum or lower colon. Tell a health care provider about:  Any  allergies you have.  All medicines you are taking, including vitamins, herbs, eye drops, creams, and over-the-counter medicines.  Any problems you or family members have had with anesthetic medicines.  Any blood disorders you have.  Any surgeries you have had.  Any medical conditions you have.  Whether you are pregnant or may be pregnant. What are the risks? Generally, this is a safe procedure. However, problems may occur, including:  Abdominal pain.  Bleeding.  Infection or inflammation in your colon.  A tear through your rectum or colon (perforation).  Allergic reactions to medicines.  Damage to other structures or organs.  What happens before the procedure?  Follow instructions from your health care provider about eating and drinking restrictions.  Ask your health care provider about: ? Changing or stopping your regular medicines. This is especially important if you are taking diabetes medicines or blood thinners. ? Taking medicines such as aspirin and ibuprofen. These medicines can thin your blood. Do not take these medicines before your procedure if your health care provider instructs you not to.  Plan to have someone take you home from the hospital or clinic.  If you will be going home right after the procedure, plan to have someone with you for 24 hours.  You will  need to follow a specific diet and use a laxative or an enema before the procedure (bowel prep). This is to clean out your colon. The bowel prep is often done the night before the procedure or the morning of the procedure. Follow instructions exactly as given by your health care provider.  You may need to have a rectal suppository or enema in the morning before your procedure. What happens during the procedure?  An IV tube may be inserted into one of your veins.  You may be given a medicine to help you relax (sedative).  You will lie on your side on the exam table. You may be asked to change positions  during the procedure.  The sigmoidoscope will be lubricated and gently inserted into your anus.  Air will be injected into your colon, and the sigmoidoscope will be moved into your sigmoid colon. You may feel some pressure or cramping.  Your health care provider will check the monitor for any abnormal findings.  Your health care provider may take a small piece of tissue to check under a microscope (biopsy).  The scope will be slowly removed. The procedure may vary among health care providers and hospitals. What happens after the procedure?  Your blood pressure, heart rate, breathing rate, and blood oxygen level will be monitored until the medicines you were given have worn off.  Do not drive for 24 hours if you received a sedative. This information is not intended to replace advice given to you by your health care provider. Make sure you discuss any questions you have with your health care provider. Document Released: 02/20/2000 Document Revised: 09/12/2015 Document Reviewed: 05/24/2015 Elsevier Interactive Patient Education  2018 [employer]  Flexible Sigmoidoscopy, Care After This sheet gives you information about how to care for yourself after your procedure. Your health care provider may also give you more specific instructions. If you have problems or questions, contact your health care provider. What can I expect after the procedure? After the procedure, it is common to have:  Abdominal cramping or pain.  Bloating.  A small amount of rectal bleeding if you had a biopsy.  Follow these instructions at home:  Take over-the-counter and prescription medicines only as told by your health care provider.  Do not drive for 24 hours if you received a medicine to help you relax (sedative).  Keep all follow-up visits as told by your health care provider. This is important. Contact a health care provider if:  You have abdominal pain or cramping that gets worse or is not helped  with medicine.  You continue to have small amounts of rectal bleeding after 24 hours.  You have nausea or vomiting.  You feel weak or dizzy.  You have a fever. Get help right away if:  You pass large blood clots or see a large amount of blood in the toilet after having a bowel movement.  You have nausea or vomiting for more than 24 hours after the procedure. This information is not intended to replace advice given to you by your health care provider. Make sure you discuss any questions you have with your health care provider. Document Released: 02/27/2013 Document Revised: 09/12/2015 Document Reviewed: 05/24/2015 Elsevier Interactive Patient Education  2018 [employer]  Monitored Anesthesia Care Anesthesia is a term that refers to techniques, procedures, and medicines that help a person stay safe and comfortable during a medical procedure. Monitored anesthesia care, or sedation, is one type of anesthesia. Your anesthesia specialist may recommend  sedation if you will be having a procedure that does not require you to be unconscious, such as:  Cataract surgery.  A dental procedure.  A biopsy.  A colonoscopy.  During the procedure, you may receive a medicine to help you relax (sedative). There are three levels of sedation:  Mild sedation. At this level, you may feel awake and relaxed. You will be able to follow directions.  Moderate sedation. At this level, you will be sleepy. You may not remember the procedure.  Deep sedation. At this level, you will be asleep. You will not remember the procedure.  The more medicine you are given, the deeper your level of sedation will be. Depending on how you respond to the procedure, the anesthesia specialist may change your level of sedation or the type of anesthesia to fit your needs. An anesthesia specialist will monitor you closely during the procedure. Let your health care provider know about:  Any allergies you have.  All medicines  you are taking, including vitamins, herbs, eye drops, creams, and over-the-counter medicines.  Any use of steroids (by mouth or as a cream).  Any problems you or family members have had with sedatives and anesthetic medicines.  Any blood disorders you have.  Any surgeries you have had.  Any medical conditions you have, such as sleep apnea.  Whether you are pregnant or may be pregnant.  Any use of cigarettes, alcohol, or street drugs. What are the risks? Generally, this is a safe procedure. However, problems may occur, including:  Getting too much medicine (oversedation).  Nausea.  Allergic reaction to medicines.  Trouble breathing. If this happens, a breathing tube may be used to help with breathing. It will be removed when you are awake and breathing on your own.  Heart trouble.  Lung trouble.  Before the procedure Staying hydrated Follow instructions from your health care provider about hydration, which may include:  Up to 2 hours before the procedure - you may continue to drink clear liquids, such as water, clear fruit juice, black coffee, and plain tea.  Eating and drinking restrictions Follow instructions from your health care provider about eating and drinking, which may include:  8 hours before the procedure - stop eating heavy meals or foods such as meat, fried foods, or fatty foods.  6 hours before the procedure - stop eating light meals or foods, such as toast or cereal.  6 hours before the procedure - stop drinking milk or drinks that contain milk.  2 hours before the procedure - stop drinking clear liquids.  Medicines Ask your health care provider about:  Changing or stopping your regular medicines. This is especially important if you are taking diabetes medicines or blood thinners.  Taking medicines such as aspirin and ibuprofen. These medicines can thin your blood. Do not take these medicines before your procedure if your health care provider  instructs you not to.  Tests and exams  You will have a physical exam.  You may have blood tests done to show: ? How well your kidneys and liver are working. ? How well your blood can clot.  General instructions  Plan to have someone take you home from the hospital or clinic.  If you will be going home right after the procedure, plan to have someone with you for 24 hours.  What happens during the procedure?  Your blood pressure, heart rate, breathing, level of pain and overall condition will be monitored.  An IV tube will be inserted into  one of your veins.  Your anesthesia specialist will give you medicines as needed to keep you comfortable during the procedure. This may mean changing the level of sedation.  The procedure will be performed. After the procedure  Your blood pressure, heart rate, breathing rate, and blood oxygen level will be monitored until the medicines you were given have worn off.  Do not drive for 24 hours if you received a sedative.  You may: ? Feel sleepy, clumsy, or nauseous. ? Feel forgetful about what happened after the procedure. ? Have a sore throat if you had a breathing tube during the procedure. ? Vomit. This information is not intended to replace advice given to you by your health care provider. Make sure you discuss any questions you have with your health care provider. Document Released: 11/18/2004 Document Revised: 08/01/2015 Document Reviewed: 06/15/2015 Elsevier Interactive Patient Education  2018 [employer] Monitored Anesthesia Care, Care After These instructions provide you with information about caring for yourself after your procedure. Your health care provider may also give you more specific instructions. Your treatment has been planned according to current medical practices, but problems sometimes occur. Call your health care provider if you have any problems or questions after your procedure. What can I expect after the  procedure? After your procedure, it is common to:  Feel sleepy for several hours.  Feel clumsy and have poor balance for several hours.  Feel forgetful about what happened after the procedure.  Have poor judgment for several hours.  Feel nauseous or vomit.  Have a sore throat if you had a breathing tube during the procedure.  Follow these instructions at home: For at least 24 hours after the procedure:   Do not: ? Participate in activities in which you could fall or become injured. ? Drive. ? Use heavy machinery. ? Drink alcohol. ? Take sleeping pills or medicines that cause drowsiness. ? Make important decisions or sign legal documents. ? Take care of children on your own.  Rest. Eating and drinking  Follow the diet that is recommended by your health care provider.  If you vomit, drink water, juice, or soup when you can drink without vomiting.  Make sure you have little or no nausea before eating solid foods. General instructions  Have a responsible adult stay with you until you are awake and alert.  Take over-the-counter and prescription medicines only as told by your health care provider.  If you smoke, do not smoke without supervision.  Keep all follow-up visits as told by your health care provider. This is important. Contact a health care provider if:  You keep feeling nauseous or you keep vomiting.  You feel light-headed.  You develop a rash.  You have a fever. Get help right away if:  You have trouble breathing. This information is not intended to replace advice given to you by your health care provider. Make sure you discuss any questions you have with your health care provider. Document Released: 06/15/2015 Document Revised: 10/15/2015 Document Reviewed: 06/15/2015 Elsevier Interactive Patient Education  [employer].

## 2018-01-25 ENCOUNTER — Other Ambulatory Visit: Payer: Self-pay | Admitting: Nurse Practitioner

## 2018-01-25 ENCOUNTER — Telehealth: Payer: Self-pay | Admitting: Nurse Practitioner

## 2018-01-25 ENCOUNTER — Other Ambulatory Visit: Payer: Self-pay

## 2018-01-25 ENCOUNTER — Encounter (HOSPITAL_COMMUNITY)
Admission: RE | Admit: 2018-01-25 | Discharge: 2018-01-25 | Disposition: A | Discharge status: Home / Self Care | Payer: Self-pay | Source: Ambulatory Visit | Attending: Internal Medicine | Admitting: Internal Medicine

## 2018-01-25 ENCOUNTER — Encounter (HOSPITAL_COMMUNITY): Payer: Self-pay

## 2018-01-25 DIAGNOSIS — L659 Nonscarring hair loss, unspecified: Secondary | ICD-10-CM

## 2018-01-25 DIAGNOSIS — K6289 Other specified diseases of anus and rectum: Secondary | ICD-10-CM | POA: Insufficient documentation

## 2018-01-25 DIAGNOSIS — Z01818 Encounter for other preprocedural examination: Secondary | ICD-10-CM | POA: Insufficient documentation

## 2018-01-25 HISTORY — DX: Personal history of urinary calculi: Z87.442

## 2018-01-25 HISTORY — DX: Anemia, unspecified: D64.9

## 2018-01-25 LAB — CBC WITH DIFFERENTIAL/PLATELET
Abs Immature Granulocytes: 0.01 10*3/uL (ref 0.00–0.07)
Basophils Absolute: 0.1 10*3/uL (ref 0.0–0.1)
Basophils Relative: 1 %
EOS PCT: 5 %
Eosinophils Absolute: 0.3 10*3/uL (ref 0.0–0.5)
HEMATOCRIT: 37.7 % (ref 36.0–46.0)
Hemoglobin: 11.9 g/dL — ABNORMAL LOW (ref 12.0–15.0)
IMMATURE GRANULOCYTES: 0 %
LYMPHS ABS: 2.5 10*3/uL (ref 0.7–4.0)
Lymphocytes Relative: 39 %
MCH: 27.7 pg (ref 26.0–34.0)
MCHC: 31.6 g/dL (ref 30.0–36.0)
MCV: 87.9 fL (ref 80.0–100.0)
MONO ABS: 0.6 10*3/uL (ref 0.1–1.0)
MONOS PCT: 10 %
Neutro Abs: 2.9 10*3/uL (ref 1.7–7.7)
Neutrophils Relative %: 45 %
Platelets: 327 10*3/uL (ref 150–400)
RBC: 4.29 MIL/uL (ref 3.87–5.11)
RDW: 12.5 % (ref 11.5–15.5)
WBC: 6.3 10*3/uL (ref 4.0–10.5)
nRBC: 0 % (ref 0.0–0.2)

## 2018-01-25 LAB — HCG, SERUM, QUALITATIVE: Preg, Serum: NEGATIVE

## 2018-01-25 NOTE — Telephone Encounter (Signed)
Pt was called and informed that her medication refill was denied by provider due to the increased risk of bleeding, she agreed and had no further questions or concerns. - She now has an appt with financial

## 2018-01-25 NOTE — Telephone Encounter (Signed)
Pt came in to request a medication refilled -naproxen (NAPROSYN) 500 MG tablet  [facility] [place] CPP was unable to refill due to a coming Sigmoidoscopy.  Please follow up

## 2018-01-30 ENCOUNTER — Encounter (HOSPITAL_COMMUNITY)
Admission: RE | Disposition: A | Discharge status: Home / Self Care | Payer: Self-pay | Source: Ambulatory Visit | Attending: Internal Medicine

## 2018-01-30 ENCOUNTER — Ambulatory Visit (HOSPITAL_COMMUNITY): Payer: Self-pay | Admitting: Anesthesiology

## 2018-01-30 ENCOUNTER — Ambulatory Visit (HOSPITAL_COMMUNITY)
Admission: RE | Admit: 2018-01-30 | Discharge: 2018-01-30 | Disposition: A | Discharge status: Home / Self Care | Payer: Self-pay | Source: Ambulatory Visit | Attending: Internal Medicine | Admitting: Internal Medicine

## 2018-01-30 ENCOUNTER — Encounter (HOSPITAL_COMMUNITY): Payer: Self-pay | Admitting: *Deleted

## 2018-01-30 DIAGNOSIS — Z87442 Personal history of urinary calculi: Secondary | ICD-10-CM | POA: Insufficient documentation

## 2018-01-30 DIAGNOSIS — Z87891 Personal history of nicotine dependence: Secondary | ICD-10-CM | POA: Insufficient documentation

## 2018-01-30 DIAGNOSIS — Z881 Allergy status to other antibiotic agents status: Secondary | ICD-10-CM | POA: Insufficient documentation

## 2018-01-30 DIAGNOSIS — K6289 Other specified diseases of anus and rectum: Secondary | ICD-10-CM

## 2018-01-30 DIAGNOSIS — Z833 Family history of diabetes mellitus: Secondary | ICD-10-CM | POA: Insufficient documentation

## 2018-01-30 DIAGNOSIS — Z803 Family history of malignant neoplasm of breast: Secondary | ICD-10-CM | POA: Insufficient documentation

## 2018-01-30 DIAGNOSIS — Z8249 Family history of ischemic heart disease and other diseases of the circulatory system: Secondary | ICD-10-CM | POA: Insufficient documentation

## 2018-01-30 DIAGNOSIS — K648 Other hemorrhoids: Secondary | ICD-10-CM

## 2018-01-30 DIAGNOSIS — Z791 Long term (current) use of non-steroidal anti-inflammatories (NSAID): Secondary | ICD-10-CM | POA: Insufficient documentation

## 2018-01-30 DIAGNOSIS — D649 Anemia, unspecified: Secondary | ICD-10-CM | POA: Insufficient documentation

## 2018-01-30 HISTORY — PX: FLEXIBLE SIGMOIDOSCOPY: SHX5431

## 2018-01-30 SURGERY — SIGMOIDOSCOPY, FLEXIBLE
Anesthesia: Monitor Anesthesia Care

## 2018-01-30 MED ORDER — PROPOFOL 500 MG/50ML IV EMUL
INTRAVENOUS | Status: DC | PRN
  Administered 2018-01-30: 150 ug/kg/min via INTRAVENOUS

## 2018-01-30 MED ORDER — STERILE WATER FOR IRRIGATION IR SOLN
Status: DC | PRN
  Administered 2018-01-30: 1.5 mL

## 2018-01-30 MED ORDER — LACTATED RINGERS IV SOLN
INTRAVENOUS | Status: DC
  Administered 2018-01-30: 1000 mL via INTRAVENOUS

## 2018-01-30 MED ORDER — CHLORHEXIDINE GLUCONATE CLOTH 2 % EX PADS: 6.0000 | MEDICATED_PAD | Freq: Once | CUTANEOUS | Status: DC

## 2018-01-30 MED ORDER — PROPOFOL 10 MG/ML IV BOLUS
INTRAVENOUS | Status: DC | PRN
  Administered 2018-01-30: 30 mg via INTRAVENOUS

## 2018-01-30 NOTE — Anesthesia Preprocedure Evaluation (Signed)
Anesthesia Evaluation  Patient identified by MRN, date of birth, ID band Patient awake    Reviewed: Allergy & Precautions, H&P , NPO status , Patient's Chart, lab work & pertinent test results  Airway Mallampati: I  TM Distance: >3 FB Neck ROM: full    Dental no notable dental hx.    Pulmonary neg pulmonary ROS, former smoker,    Pulmonary exam normal breath sounds clear to auscultation       Cardiovascular Exercise Tolerance: Good negative cardio ROS   Rhythm:regular Rate:Normal     Neuro/Psych negative psych ROS   GI/Hepatic negative GI ROS, Neg liver ROS,   Endo/Other  negative endocrine ROS  Renal/GU negative Renal ROS  negative genitourinary   Musculoskeletal   Abdominal   Peds  Hematology negative hematology ROS (+)   Anesthesia Other Findings   Reproductive/Obstetrics negative OB ROS                             Anesthesia Physical Anesthesia Plan  ASA: I  Anesthesia Plan: MAC   Post-op Pain Management:    Induction:   PONV Risk Score and Plan:   Airway Management Planned:   Additional Equipment:   Intra-op Plan:   Post-operative Plan:   Informed Consent: I have reviewed the patients History and Physical, chart, labs and discussed the procedure including the risks, benefits and alternatives for the proposed anesthesia with the patient or authorized representative who has indicated his/her understanding and acceptance.     Plan Discussed with: CRNA  Anesthesia Plan Comments:         Anesthesia Quick Evaluation

## 2018-01-30 NOTE — Anesthesia Postprocedure Evaluation (Signed)
Anesthesia Post Note  Patient: Stephanie Young  Procedure(s) Performed: FLEXIBLE SIGMOIDOSCOPY WITH PROPOFOL (N/A )  Patient location during evaluation: PACU Anesthesia Type: MAC Level of consciousness: awake and alert Pain management: pain level controlled Vital Signs Assessment: post-procedure vital signs reviewed and stable Respiratory status: spontaneous breathing Cardiovascular status: stable Postop Assessment: no apparent nausea or vomiting Anesthetic complications: no     Last Vitals:  Vitals:   01/30/18 1238  BP: 119/70  Pulse: 65  Resp: 18  Temp: 37 C  SpO2: 99%    Last Pain:  Vitals:   01/30/18 1252  TempSrc:   PainSc: 0-No pain                 ADAMS, AMY A

## 2018-01-30 NOTE — H&P (Signed)
 @LOGO @   Primary Care Physician:  Claiborne RiggFleming, [name], NP Primary Gastroenterologist:  Dr. Jena Gaussourk  Pre-Procedure History & Physical: HPI:  Stephanie RanaJuliana E Young is a 27 y.o. female here for intermittent rectal pain.  Denies constipation/diarrhea.  Discomfort almost totally resolved after using [facility] rectal cream x2 doses  Past Medical History:  Diagnosis Date  . Anemia   . H. pylori infection   . History of kidney stones     Past Surgical History:  Procedure Laterality Date  . dental procedure      Prior to Admission medications   Medication Sig Start Date End Date Taking? Authorizing Provider  acetaminophen (TYLENOL) 500 MG tablet Take 1,000 mg by mouth every 6 (six) hours as needed for moderate pain.    Yes [provider]  folic acid (FOLVITE) 1 MG tablet Take 1 mg by mouth daily.   Yes [provider]  Multiple Vitamin (MULTIVITAMIN WITH MINERALS) TABS tablet Take 1 tablet by mouth daily. 11/02/17  Yes Osvaldo ShipperKrishnan, Gokul, MD  naproxen (NAPROSYN) 500 MG tablet Take 1 tablet (500 mg total) by mouth 2 (two) times daily with a meal. Patient taking differently: Take 500 mg by mouth daily as needed for moderate pain.  11/21/17  Yes Claiborne RiggFleming, [name], NP  tiZANidine (ZANAFLEX) 4 MG tablet Take 1 tablet (4 mg total) by mouth every 6 (six) hours as needed for muscle spasms. 11/21/17  Yes Claiborne RiggFleming, [name], NP  vitamin B-12 (CYANOCOBALAMIN) 500 MCG tablet Take 500 mcg by mouth daily.   Yes [provider]  hydrocortisone (ANUSOL-HC) 2.5 % rectal cream [facility] cream with lidocaine and 0.2% nitro. Patient taking differently: Place 1 application rectally daily as needed for hemorrhoids. [facility] cream with lidocaine and 0.2% nitro. 12/14/17   Gelene MinkBoone, Anna W, NP    Allergies as of 01/02/2018 - Review Complete 12/14/2017  Allergen Reaction Noted  . Cephalosporins Rash 10/29/2017    Family History  Problem Relation Age of Onset  . Breast  cancer Mother 2964  . Hypertension Mother   . Hypertension Brother   . Diabetes Maternal Uncle   . Diabetes Other   . Colon cancer Neg Hx   . Colon polyps Neg Hx     Social History   Socioeconomic History  . Marital status: Married    Spouse name: Not on file  . Number of children: Not on file  . Years of education: Not on file  . Highest education level: Not on file  Occupational History  . Not on file  Social Needs  . Financial resource strain: Not on file  . Food insecurity:    Worry: Not on file    Inability: Not on file  . Transportation needs:    Medical: Not on file    Non-medical: Not on file  Tobacco Use  . Smoking status: Former Smoker    Packs/day: 0.25    Years: 9.00    Pack years: 2.25    Types: Cigarettes    Last attempt to quit: 10/25/2017    Years since quitting: 0.2  . Smokeless tobacco: Never Used  Substance and Sexual Activity  . Alcohol use: Yes    Comment: socially   . Drug use: Not Currently    Types: Marijuana    Comment: none since 10/2017  . Sexual activity: Yes    Partners: Male    Birth control/protection: None  Lifestyle  . Physical activity:    Days per week: Not on file  Minutes per session: Not on file  . Stress: Not on file  Relationships  . Social connections:    Talks on phone: Not on file    Gets together: Not on file    Attends religious service: Not on file    Active member of club or organization: Not on file    Attends meetings of clubs or organizations: Not on file    Relationship status: Not on file  . Intimate partner violence:    Fear of current or ex partner: Not on file    Emotionally abused: Not on file    Physically abused: Not on file    Forced sexual activity: Not on file  Other Topics Concern  . Not on file  Social History Narrative  . Not on file    Review of Systems: See HPI, otherwise negative ROS  Physical Exam: BP 119/70   Pulse 65   Temp 98.6 F (37 C) (Oral)   Resp 18   LMP 01/26/2018    SpO2 99%  General:   Alert,  Well-developed, well-nourished, pleasant and cooperative in NAD Neck:  Supple; no masses or thyromegaly. No significant cervical adenopathy. Lungs:  Clear throughout to auscultation.   No wheezes, crackles, or rhonchi. No acute distress. Heart:  Regular rate and rhythm; no murmurs, clicks, rubs,  or gallops. Abdomen: Non-distended, normal bowel sounds.  Soft and nontender without appreciable mass or hepatosplenomegaly.  Pulses:  Normal pulses noted. Extremities:  Without clubbing or edema.  Impression/Plan: Rectal pain-likely fissure.  Sigmoidoscopy to further investigate per plan. The risks, benefits, limitations, alternatives and imponderables have been reviewed with the patient. Questions have been answered. All parties are agreeable.      Notice: This dictation was prepared with Dragon dictation along with smaller phrase technology. Any transcriptional errors that result from this process are unintentional and may not be corrected upon review.

## 2018-01-30 NOTE — Discharge Instructions (Signed)
°  Sigmoidoscopy Discharge Instructions  Read the instructions outlined below and refer to this sheet in the next few weeks. These discharge instructions provide you with general information on caring for yourself after you leave the hospital. Your doctor may also give you specific instructions. While your treatment has been planned according to the most current medical practices available, unavoidable complications occasionally occur. If you have any problems or questions after discharge, call Dr. Jena Gaussourk at [phone]. ACTIVITY  You may resume your regular activity, but move at a slower pace for the next 24 hours.   Take frequent rest periods for the next 24 hours.   Walking will help get rid of the air and reduce the bloated feeling in your belly (abdomen).   No driving for 24 hours (because of the medicine (anesthesia) used during the test).    Do not sign any important legal documents or operate any machinery for 24 hours (because of the anesthesia used during the test).  NUTRITION  Drink plenty of fluids.   You may resume your normal diet as instructed by your doctor.   Begin with a light meal and progress to your normal diet. Heavy or fried foods are harder to digest and may make you feel sick to your stomach (nauseated).   Avoid alcoholic beverages for 24 hours or as instructed.  MEDICATIONS  You may resume your normal medications unless your doctor tells you otherwise.  WHAT YOU CAN EXPECT TODAY  Some feelings of bloating in the abdomen.   Passage of more gas than usual.   Spotting of blood in your stool or on the toilet paper.  IF YOU HAD POLYPS REMOVED DURING THE COLONOSCOPY:  No aspirin products for 7 days or as instructed.   No alcohol for 7 days or as instructed.   Eat a soft diet for the next 24 hours.  FINDING OUT THE RESULTS OF YOUR TEST Not all test results are available during your visit. If your test results are not back during the visit, make an appointment  with your caregiver to find out the results. Do not assume everything is normal if you have not heard from your caregiver or the medical facility. It is important for you to follow up on all of your test results.  SEEK IMMEDIATE MEDICAL ATTENTION IF:  You have more than a spotting of blood in your stool.   Your belly is swollen (abdominal distention).   You are nauseated or vomiting.   You have a temperature over 101.   You have abdominal pain or discomfort that is severe or gets worse throughout the day.    Information on anal fissure /hemorrhoids  provided  Use [facility] cream as needed for recurrent symptoms  If symptoms are recurrent infrequent, a minor surgical procedure may be needed  Call if any ongoing persisting problems.

## 2018-01-30 NOTE — Addendum Note (Signed)
Addendum  created 01/30/18 1604 by Earleen NewportAdams, Amy A, CRNA   Charge Capture section accepted

## 2018-01-30 NOTE — Transfer of Care (Signed)
Immediate Anesthesia Transfer of Care Note  Patient: Stephanie Young  Procedure(s) Performed: FLEXIBLE SIGMOIDOSCOPY WITH PROPOFOL (N/A )  Patient Location: PACU  Anesthesia Type:MAC  Level of Consciousness: awake, alert , oriented and patient cooperative  Airway & Oxygen Therapy: Patient Spontanous Breathing  Post-op Assessment: Report given to RN and Post -op Vital signs reviewed and stable  Post vital signs: Reviewed and stable  Last Vitals:  Vitals Value Taken Time  BP    Temp    Pulse    Resp    SpO2      Last Pain:  Vitals:   01/30/18 1252  TempSrc:   PainSc: 0-No pain      Patients Stated Pain Goal: 7 (97/02/63 7858)  Complications: No apparent anesthesia complications

## 2018-01-30 NOTE — Op Note (Signed)
[facility] Patient Name: Stephanie Young Procedure Date: 01/30/2018 12:36 PM MRN: [id number] Date of Birth: 07/30/1990 Attending MD: Gennette Pac , MD CSN: [id number] Age: 27 Admit Type: Outpatient Procedure:                Flexible Sigmoidoscopy Indications:              Anal pain, Rectal pain Providers:                Gennette Pac, MD, Sterling Big, RN,                            Dyann Ruddle Referring MD:             Claiborne Rigg Medicines:                Propofol per Anesthesia Complications:            No immediate complications. Estimated Blood Loss:     Estimated blood loss: none. Procedure:                Pre-Anesthesia Assessment:                           - Prior to the procedure, a History and Physical                            was performed, and patient medications and                            allergies were reviewed. The patient's tolerance of                            previous anesthesia was also reviewed. The risks                            and benefits of the procedure and the sedation                            options and risks were discussed with the patient.                            All questions were answered, and informed consent                            was obtained. Prior Anticoagulants: The patient has                            taken no previous anticoagulant or antiplatelet                            agents. ASA Grade Assessment: II - A patient with                            mild systemic disease. After reviewing the risks  and benefits, the patient was deemed in                            satisfactory condition to undergo the procedure.                           After obtaining informed consent, the scope was                            passed under direct vision. The CF-[id number] ([id number])                            scope was introduced through the and advanced to                            the  the descending colon. The flexible                            sigmoidoscopy was accomplished without difficulty.                            The patient tolerated the procedure well. The                            quality of the bowel preparation was adequate. Scope In: 12:57:47 PM Scope Out: 1:02:28 PM Total Procedure Duration: 0 hours 4 minutes 41 seconds  Findings:      The perianal and digital rectal examinations were normal.      Internal hemorrhoids were found during retroflexion. The hemorrhoids       were moderate and medium-sized. Exam carried out to approximately 40 cm       (proximal sigmoid). Rectal and colonic mucosa to 40 cm appeared normal. Impression:               - Internal hemorrhoids. Otherwise, negative                            sigmoidoscopy to 40 cm. I suspect anal fissure.                            Symptoms much improved after only a couple of doses                            of [facility] rectal cream.                           - No specimens collected. Moderate Sedation:      Moderate (conscious) sedation was personally administered by an       anesthesia professional. The following parameters were monitored: oxygen       saturation, heart rate, blood pressure, respiratory rate, EKG, adequacy       of pulmonary ventilation, and response to care. Recommendation:           - Advance diet as tolerated. Use [facility] rectal cream on an as-needed basis. If                            symptoms are frequent and recurrent, may need a                            minor surgical procedure. Patient is to let us know                            if you encounter future problems. Procedure Code(s):        --- Professional ---                           [phone], Sigmoidoscopy, flexible; diagnostic,                            including collection of specimen(s) by brushing or                            washing, when performed (separate  procedure) Diagnosis Code(s):        --- Professional ---                           K64.8, Other hemorrhoids                           K62.89, Other specified diseases of anus and rectum CPT copyright 2018 American Medical Association. All rights reserved. The codes documented in this report are preliminary and upon coder review may  be revised to meet current compliance requirements. Gerrit Friendsobert M. , MD Gennette Pacobert Michael , MD 01/30/2018 1:09:39 PM This report has been signed electronically. Number of Addenda: 0

## 2018-01-31 NOTE — Addendum Note (Signed)
Addendum  created 01/31/18 0818 by Franco NonesYates, Kaimana Lurz S, CRNA   Charge Capture section accepted

## 2018-02-01 ENCOUNTER — Ambulatory Visit: Payer: Self-pay | Attending: Nurse Practitioner

## 2018-02-01 ENCOUNTER — Telehealth: Payer: Self-pay | Admitting: Nurse Practitioner

## 2018-02-01 DIAGNOSIS — L659 Nonscarring hair loss, unspecified: Secondary | ICD-10-CM | POA: Insufficient documentation

## 2018-02-01 DIAGNOSIS — T148XXA Other injury of unspecified body region, initial encounter: Secondary | ICD-10-CM

## 2018-02-01 MED ORDER — TIZANIDINE HCL 4 MG PO TABS: 4.0000 mg | ORAL_TABLET | Freq: Four times a day (QID) | ORAL | 0 refills | Status: DC | PRN

## 2018-02-01 MED FILL — tiZANidine HCL 4 MG TABS: 4 | 15 days supply | Qty: 60 | Fill #0

## 2018-02-01 NOTE — Progress Notes (Signed)
Patient here for lab visit only 

## 2018-02-01 NOTE — Telephone Encounter (Signed)
1 fill sent. Additional refills to be sent by PCP.

## 2018-02-01 NOTE — Telephone Encounter (Signed)
1) Medication(s) Requested (by name): -tiZANidine (ZANAFLEX) 4 MG tablet   2) Pharmacy of Choice: -[facility] [place]) Special Requests:   Approved medications will be sent to the pharmacy, we will reach out if there is an issue.  Requests made after 3pm may not be addressed until the following business day!  If a patient is unsure of the name of the medication(s) please note and ask patient to call back when they are able to provide all info, do not send to responsible party until all information is available!

## 2018-02-06 ENCOUNTER — Encounter (HOSPITAL_COMMUNITY): Payer: Self-pay | Admitting: Internal Medicine

## 2018-02-06 LAB — THYROID PANEL WITH TSH
Free Thyroxine Index: 2.4 (ref 1.2–4.9)
T3 Uptake Ratio: 28 % (ref 24–39)
T4 TOTAL: 8.6 ug/dL (ref 4.5–12.0)
TSH: 0.822 u[IU]/mL (ref 0.450–4.500)

## 2018-02-06 LAB — ANDROSTENEDIONE: ANDROSTENEDIONE: 107 ng/dL (ref 41–262)

## 2018-02-08 ENCOUNTER — Ambulatory Visit: Payer: Self-pay | Attending: Family Medicine

## 2018-02-28 ENCOUNTER — Ambulatory Visit: Payer: Self-pay | Admitting: Neurology

## 2018-03-09 ENCOUNTER — Encounter: Payer: Self-pay | Admitting: Nurse Practitioner

## 2018-03-14 ENCOUNTER — Other Ambulatory Visit: Payer: Self-pay | Admitting: Nurse Practitioner

## 2018-03-14 DIAGNOSIS — T148XXA Other injury of unspecified body region, initial encounter: Secondary | ICD-10-CM

## 2018-03-14 MED ORDER — TIZANIDINE HCL 4 MG PO TABS: 4.0000 mg | ORAL_TABLET | Freq: Four times a day (QID) | ORAL | 1 refills | Status: AC | PRN

## 2018-03-14 NOTE — Telephone Encounter (Signed)
Mychart message

## 2018-03-15 MED FILL — tiZANidine HCL 4 MG TABS: 4 | 15 days supply | Qty: 60 | Fill #0

## 2018-03-16 ENCOUNTER — Ambulatory Visit: Payer: Self-pay | Admitting: Gastroenterology

## 2018-03-22 ENCOUNTER — Other Ambulatory Visit (HOSPITAL_COMMUNITY): Payer: Self-pay

## 2018-03-30 ENCOUNTER — Encounter: Payer: Self-pay | Admitting: Gastroenterology

## 2018-03-30 ENCOUNTER — Ambulatory Visit: Payer: BLUE CROSS/BLUE SHIELD | Admitting: Gastroenterology

## 2018-03-30 VITALS — BP 123/78 | HR 59 | Temp 97.9°F | Ht 64.0 in | Wt 154.4 lb

## 2018-03-30 DIAGNOSIS — K641 Second degree hemorrhoids: Secondary | ICD-10-CM

## 2018-03-30 DIAGNOSIS — K642 Third degree hemorrhoids: Secondary | ICD-10-CM | POA: Insufficient documentation

## 2018-03-30 NOTE — Patient Instructions (Signed)
Avoid straining. It would be good to add fiber such as Benefiber 2 teaspoons twice daily.  Continue to limit your toilet time to 2-3 minutes  Please call me or message with any issues.  Message me when you find out your schedule for any Tues-Thursday morning availability you have, and we can make it work.      I enjoyed seeing you again today! As you know, I value our relationship and want to provide genuine, compassionate, and quality care. I welcome your feedback. If you receive a survey regarding your visit,  I greatly appreciate you taking time to fill this out. See you next time!  Gelene Mink, PhD, ANP-BC [facility]astroenter[facility]

## 2018-03-30 NOTE — Progress Notes (Signed)
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 Suspected anal fissure. She had been using the [facility] cream compounded with lidocaine and nitro and noticed resolution of discomfort by time of procedure. She contacted the office last week with concerns for rectal bleeding. No rectal pain. She is presenting today with reports of Grade II hemorrhoids, with intermittent prolapse but without need for manual reduction. She is requesting rubber band ligation of his/her hemorrhoidal disease. All risks, benefits, and alternative forms of therapy were described and informed consent was obtained.  The decision was made to band the RIGHT POSTERIOR internal hemorrhoid, and the [facility] System was used to perform band ligation without complication. Digital anorectal examination was then performed to assure proper positioning of the band. The patient was discharged home without pain or other issues. Dietary and behavioral recommendations were given, along with follow-up instructions. The patient will return in 2-4 weeks.   No complications were encountered and the patient tolerated the procedure well  Gelene Mink, PhD, [facility] Gastroenterology

## 2018-04-07 ENCOUNTER — Encounter: Payer: Self-pay | Admitting: Nurse Practitioner

## 2018-04-10 ENCOUNTER — Other Ambulatory Visit: Payer: Self-pay | Admitting: Nurse Practitioner

## 2018-04-10 DIAGNOSIS — Z3169 Encounter for other general counseling and advice on procreation: Secondary | ICD-10-CM

## 2018-04-13 ENCOUNTER — Ambulatory Visit (HOSPITAL_COMMUNITY)
Admission: RE | Admit: 2018-04-13 | Discharge: 2018-04-13 | Disposition: A | Discharge status: Home / Self Care | Payer: BLUE CROSS/BLUE SHIELD | Source: Ambulatory Visit | Attending: Nurse Practitioner | Admitting: Nurse Practitioner

## 2018-04-13 DIAGNOSIS — N921 Excessive and frequent menstruation with irregular cycle: Secondary | ICD-10-CM | POA: Diagnosis not present

## 2018-04-13 DIAGNOSIS — D252 Subserosal leiomyoma of uterus: Secondary | ICD-10-CM | POA: Diagnosis not present

## 2018-06-01 DIAGNOSIS — N979 Female infertility, unspecified: Secondary | ICD-10-CM | POA: Diagnosis not present

## 2018-06-15 DIAGNOSIS — N979 Female infertility, unspecified: Secondary | ICD-10-CM | POA: Diagnosis not present

## 2018-06-16 ENCOUNTER — Encounter: Payer: Self-pay | Admitting: Nurse Practitioner

## 2018-07-04 ENCOUNTER — Encounter: Payer: Self-pay | Admitting: Nurse Practitioner

## 2018-07-04 ENCOUNTER — Other Ambulatory Visit: Payer: Self-pay | Admitting: Nurse Practitioner

## 2018-07-04 ENCOUNTER — Ambulatory Visit: Payer: BLUE CROSS/BLUE SHIELD | Attending: Nurse Practitioner | Admitting: Nurse Practitioner

## 2018-07-04 ENCOUNTER — Other Ambulatory Visit: Payer: Self-pay

## 2018-07-04 DIAGNOSIS — M5441 Lumbago with sciatica, right side: Secondary | ICD-10-CM

## 2018-07-04 DIAGNOSIS — G8929 Other chronic pain: Secondary | ICD-10-CM | POA: Diagnosis not present

## 2018-07-04 NOTE — Progress Notes (Deleted)
Virtual Visit via Telephone Note Due to national recommendations of social distancing due to COVID 19, telehealth visit is felt to be most appropriate for this patient at this time.  I discussed the limitations, risks, security and privacy concerns of performing an evaluation and management service by telephone and the availability of in person appointments. I also discussed with the patient that there may be a patient responsible charge related to this service. The patient expressed understanding and agreed to proceed.    I connected with Stephanie Young on 07/04/18  at    EDT by telephone and verified that I am speaking with the correct person using two identifiers.   Consent I discussed the limitations, risks, security and privacy concerns of performing an evaluation and management service by telephone and the availability of in person appointments. I also discussed with the patient that there may be a patient responsible charge related to this service. The patient expressed understanding and agreed to proceed.   Location of Patient: Private  ***   Location of Provider: [facility]    Persons participating in Telemedicine visit: Bertram Denver FNP-BC YY [place] CMA Ethel Rana    History of Present Illness: Telemedicine visit for:  Back pain Lower back pain 4-5 days Tizanidine      Past Medical History:  Diagnosis Date  . Anemia   . H. pylori infection   . History of kidney stones     Past Surgical History:  Procedure Laterality Date  . dental procedure    . FLEXIBLE SIGMOIDOSCOPY N/A 01/30/2018   Procedure: FLEXIBLE SIGMOIDOSCOPY WITH PROPOFOL;  Surgeon: Corbin Ade, MD;  Location: [facility];  Service: Endoscopy;  Laterality: N/A;  1:45pm    Family History  Problem Relation Age of Onset  . Breast cancer Mother 20  . Hypertension Mother   . Hypertension Brother   . Diabetes Maternal Uncle   . Diabetes Other   . Colon  cancer Neg Hx   . Colon polyps Neg Hx     Social History   Socioeconomic History  . Marital status: Married    Spouse name: Not on file  . Number of children: Not on file  . Years of education: Not on file  . Highest education level: Not on file  Occupational History  . Not on file  Social Needs  . Financial resource strain: Not on file  . Food insecurity:    Worry: Not on file    Inability: Not on file  . Transportation needs:    Medical: Not on file    Non-medical: Not on file  Tobacco Use  . Smoking status: Former Smoker    Packs/day: 0.25    Years: 9.00    Pack years: 2.25    Types: Cigarettes    Last attempt to quit: 10/25/2017    Years since quitting: 0.6  . Smokeless tobacco: Never Used  Substance and Sexual Activity  . Alcohol use: Yes    Comment: socially   . Drug use: Not Currently    Types: Marijuana    Comment: none since 10/2017  . Sexual activity: Yes    Partners: Male    Birth control/protection: None  Lifestyle  . Physical activity:    Days per week: Not on file    Minutes per session: Not on file  . Stress: Not on file  Relationships  . Social connections:    Talks on phone: Not on file  Gets together: Not on file    Attends religious service: Not on file    Active member of club or organization: Not on file    Attends meetings of clubs or organizations: Not on file    Relationship status: Not on file  Other Topics Concern  . Not on file  Social History Narrative  . Not on file     Observations/Objective: Awake, alert and oriented x 3   ROS  Assessment and Plan: There are no diagnoses linked to this encounter.   Follow Up Instructions No follow-ups on file.     I discussed the assessment and treatment plan with the patient. The patient was provided an opportunity to ask questions and all were answered. The patient agreed with the plan and demonstrated an understanding of the instructions.   The patient was advised to call back  or seek an in-person evaluation if the symptoms worsen or if the condition fails to improve as anticipated.  I provided *** minutes of non-face-to-face time during this encounter including median intraservice time, reviewing previous notes, labs, imaging, medications and explaining diagnosis and management.  Claiborne RiggZelda W Fleming, FNP-BC

## 2018-07-05 ENCOUNTER — Encounter: Payer: Self-pay | Admitting: Nurse Practitioner

## 2018-07-05 ENCOUNTER — Other Ambulatory Visit: Payer: Self-pay | Admitting: Nurse Practitioner

## 2018-07-05 MED ORDER — TIZANIDINE HCL 4 MG PO TABS: 4.0000 mg | ORAL_TABLET | Freq: Four times a day (QID) | ORAL | 0 refills | Status: DC | PRN

## 2018-07-05 MED ORDER — MELOXICAM 7.5 MG PO TABS: 7.5000 mg | ORAL_TABLET | Freq: Every day | ORAL | 0 refills | Status: DC

## 2018-07-06 MED FILL — MELOXICAM 7.5 MG TABLET: 7.5 | 30 days supply | Qty: 30 | Fill #0

## 2018-07-06 MED FILL — tiZANidine HCL 4 MG TABS: 4 | 7 days supply | Qty: 30 | Fill #0

## 2018-07-07 ENCOUNTER — Encounter: Payer: Self-pay | Admitting: Nurse Practitioner
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
## 2018-07-19 ENCOUNTER — Encounter: Payer: Self-pay | Admitting: Nurse Practitioner

## 2018-07-19 ENCOUNTER — Other Ambulatory Visit: Payer: Self-pay | Admitting: Nurse Practitioner

## 2018-07-19 MED ORDER — MICONAZOLE NITRATE 200 MG VA SUPP: 200.0000 mg | Freq: Every day | VAGINAL | 0 refills | Status: DC

## 2018-08-14 ENCOUNTER — Encounter: Payer: Self-pay | Admitting: Nurse Practitioner

## 2018-08-15 ENCOUNTER — Other Ambulatory Visit: Payer: Self-pay | Admitting: Nurse Practitioner

## 2018-08-15 DIAGNOSIS — F419 Anxiety disorder, unspecified: Secondary | ICD-10-CM

## 2018-08-15 DIAGNOSIS — N970 Female infertility associated with anovulation: Secondary | ICD-10-CM | POA: Diagnosis not present

## 2018-08-15 MED ORDER — TIZANIDINE HCL 4 MG PO TABS: 4.0000 mg | ORAL_TABLET | Freq: Four times a day (QID) | ORAL | 1 refills | Status: DC | PRN

## 2018-08-15 MED ORDER — TRIAMCINOLONE ACETONIDE 0.5 % EX OINT: 1.0000 "application " | TOPICAL_OINTMENT | Freq: Two times a day (BID) | CUTANEOUS | 0 refills | Status: DC

## 2018-08-15 MED ORDER — MELOXICAM 7.5 MG PO TABS: 7.5000 mg | ORAL_TABLET | Freq: Every day | ORAL | 1 refills | Status: DC

## 2018-09-01 DIAGNOSIS — F320 Major depressive disorder, single episode, mild: Secondary | ICD-10-CM | POA: Diagnosis not present

## 2018-09-01 DIAGNOSIS — F429 Obsessive-compulsive disorder, unspecified: Secondary | ICD-10-CM | POA: Diagnosis not present

## 2018-09-01 DIAGNOSIS — F411 Generalized anxiety disorder: Secondary | ICD-10-CM | POA: Diagnosis not present

## 2018-10-05 ENCOUNTER — Other Ambulatory Visit: Payer: Self-pay

## 2018-10-05 ENCOUNTER — Ambulatory Visit (HOSPITAL_COMMUNITY)
Admission: EM | Admit: 2018-10-05 | Discharge: 2018-10-05 | Disposition: A | Discharge status: Home / Self Care | Payer: BC Managed Care – PPO | Attending: Family Medicine | Admitting: Family Medicine

## 2018-10-05 ENCOUNTER — Encounter (HOSPITAL_COMMUNITY): Payer: Self-pay

## 2018-10-05 DIAGNOSIS — S65412A Laceration of blood vessel of left thumb, initial encounter: Secondary | ICD-10-CM

## 2018-10-05 DIAGNOSIS — W260XXA Contact with knife, initial encounter: Secondary | ICD-10-CM

## 2018-10-05 DIAGNOSIS — Z23 Encounter for immunization: Secondary | ICD-10-CM | POA: Diagnosis not present

## 2018-10-05 MED ORDER — TETANUS-DIPHTH-ACELL PERTUSSIS 5-2.5-18.5 LF-MCG/0.5 IM SUSP
0.5000 mL | Freq: Once | INTRAMUSCULAR | Status: AC
  Administered 2018-10-05: 0.5 mL via INTRAMUSCULAR

## 2018-10-05 MED ORDER — TETANUS-DIPHTH-ACELL PERTUSSIS 5-2.5-18.5 LF-MCG/0.5 IM SUSP
INTRAMUSCULAR | Status: AC
  Filled 2018-10-05: qty 0.5

## 2018-10-05 NOTE — Discharge Instructions (Addendum)
Keep clean and dry Glue will fall off in a few days Watch for infection

## 2018-10-05 NOTE — ED Triage Notes (Signed)
Pt presents with left thumb laceration.

## 2018-10-05 NOTE — ED Provider Notes (Signed)
[facility]    CSN: [id number] Arrival date & time: 10/05/18  1920      History   Chief Complaint Chief Complaint  Patient presents with  . Thumb Laceration    HPI Stephanie Young is a 28 y.o. female.   HPI  Thumb with a knife or hurting dinner tonight.  Stop bleeding with pressure.  Cleaned with hydrogen peroxide.  Is due for a tetanus booster.  Past Medical History:  Diagnosis Date  . Anemia   . H. pylori infection   . History of kidney stones     Patient Active Problem List   Diagnosis Date Noted  . Grade III hemorrhoids 03/30/2018  . Tachypnea   . Hyponatremia   . Leukocytosis   . Acute blood loss anemia   . Acute respiratory failure with hypoxia (HCC)   . ARDS (adult respiratory distress syndrome) (HCC)   . Multifocal pneumonia 10/13/2017  . Hypokalemia 10/13/2017  . Lobar pneumonia (HCC) 10/13/2017  . 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 Abdominal cramping 05/24/2017    Past Surgical History:  Procedure Laterality Date  . dental procedure    . FLEXIBLE SIGMOIDOSCOPY N/A 01/30/2018   Procedure: FLEXIBLE SIGMOIDOSCOPY WITH PROPOFOL;  Surgeon: Corbin Adeourk, Robert M, MD;  [facility]: [facility];  Service: Endoscopy;  Laterality: N/A;  1:45pm    OB History    Gravida  0   Para  0   Term  0   Preterm  0   AB  0   Living  0     SAB  0   TAB  0   Ectopic  0   Multiple  0   Live Births  0            Home Medications    Prior to Admission medications   Medication Sig Start Date End Date Taking? Authorizing Provider  acetaminophen (TYLENOL) 500 MG tablet Take 1,000 mg by mouth every 6 (six) hours as needed for moderate pain.     [provider]  folic acid (FOLVITE) 1 MG tablet Take 1 mg by mouth daily.    [provider]  meloxicam (MOBIC) 7.5 MG tablet Take 1 tablet (7.5 mg total) by mouth daily. 08/15/18   Claiborne RiggFleming, [name], NP  Multiple Vitamin (MULTIVITAMIN WITH MINERALS) TABS tablet Take 1 tablet by mouth  daily. Patient not taking: Reported on 07/04/2018 11/02/17   Osvaldo ShipperKrishnan, Gokul, MD  tiZANidine (ZANAFLEX) 4 MG tablet Take 1 tablet (4 mg total) by mouth every 6 (six) hours as needed for muscle spasms. 08/15/18   Claiborne RiggFleming, [name], NP  triamcinolone ointment (KENALOG) 0.5 % Apply 1 application topically 2 (two) times daily. 08/15/18   Claiborne RiggFleming, [name], NP  vitamin B-12 (CYANOCOBALAMIN) 500 MCG tablet Take 500 mcg by mouth daily.    [provider]    Family History Family History  Problem Relation Age of Onset  . Breast cancer Mother 5564  . Hypertension Mother   . Hypertension Brother   . Diabetes Maternal Uncle   . Diabetes Other   . Colon cancer Neg Hx   . Colon polyps Neg Hx     Social History Social History   Tobacco Use  . Smoking status: Former Smoker    Packs/day: 0.25    Years: 9.00    Pack years: 2.25    Types: Cigarettes    Quit date: 10/25/2017    Years since quitting: 0.9  . Smokeless tobacco: Never Used  Substance Use Topics  . Alcohol use: Yes    Comment: socially   . Drug use: Not Currently    Types: Marijuana    Comment: none since 10/2017     Allergies   Cephalosporins   Review of Systems Review of Systems  Constitutional: Negative for chills and fever.  HENT: Negative for ear pain and sore throat.   Eyes: Negative for pain and visual disturbance.  Respiratory: Negative for cough and shortness of breath.   Cardiovascular: Negative for chest pain and palpitations.  Gastrointestinal: Negative for abdominal pain and vomiting.  Genitourinary: Negative for dysuria and hematuria.  Musculoskeletal: Negative for arthralgias and back pain.  Skin: Positive for wound. Negative for color change and rash.  Neurological: Negative for seizures and syncope.  All other systems reviewed and are negative.    Physical Exam Triage Vital Signs ED Triage Vitals [10/05/18 1958]  Enc Vitals Group     BP (!) 138/94     Pulse Rate 70     Resp 18     Temp 98.4 F  (36.9 C)     Temp Source Oral     SpO2 99 %     Weight      Height      Head Circumference      Peak Flow      Pain Score 3     Pain Loc      Pain Edu?      Excl. in [place]?    No data found.  Updated Vital Signs BP (!) 138/94 (BP Location: Right Arm)   Pulse 70   Temp 98.4 F (36.9 C) (Oral)   Resp 18   LMP 09/23/2018   SpO2 99%      Physical Exam Constitutional:      General: She is not in acute distress.    Appearance: She is well-developed.  HENT:     Head: Normocephalic and atraumatic.  Eyes:     Conjunctiva/sclera: Conjunctivae normal.     Pupils: Pupils are equal, round, and reactive to light.  Neck:     Musculoskeletal: Normal range of motion.  Cardiovascular:     Rate and Rhythm: Normal rate.  Pulmonary:     Effort: Pulmonary effort is normal. No respiratory distress.  Abdominal:     General: There is no distension.     Palpations: Abdomen is soft.  Musculoskeletal: Normal range of motion.  Skin:    General: Skin is warm and dry.     Comments: Superficial laceration to the lateral thumb at the proximal nail that is to but not through the nail.  About 1 cm long.  Edges are approximated.  Glued  Neurological:     Mental Status: She is alert.      UC Treatments / Results  Labs (all labs ordered are listed, but only abnormal results are displayed) Labs Reviewed - No data to display  EKG   Radiology No results found.  Procedures Procedures (including critical care time)  Medications Ordered in UC Medications  Tdap (BOOSTRIX) injection 0.5 mL (0.5 mLs Intramuscular Given 10/05/18 2017)  Tdap (BOOSTRIX) 5-2.5-18.5 LF-MCG/0.5 injection (has no administration in time range)    Initial Impression / Assessment and Plan / UC Course  I have reviewed the triage vital signs and the nursing notes.  Pertinent labs & imaging results that were available during my care of the patient were reviewed by me and considered in my medical decision making (see  chart for  details).     Discussed wound care Final Clinical Impressions(s) / UC Diagnoses   Final diagnoses:  Laceration of blood vessel of left thumb, initial encounter     Discharge Instructions     Keep clean and dry Glue will fall off in a few days Watch for infection   ED Prescriptions    None     Controlled Substance Prescriptions [place] Controlled Substance Registry consulted? Not Applicable   Eustace MooreNelson, Jayren Cease Sue, MD 10/05/18 2024

## 2018-10-10 DIAGNOSIS — F411 Generalized anxiety disorder: Secondary | ICD-10-CM | POA: Diagnosis not present

## 2018-10-12 ENCOUNTER — Encounter: Payer: Self-pay | Admitting: Nurse Practitioner

## 2018-10-13 ENCOUNTER — Other Ambulatory Visit: Payer: Self-pay | Admitting: Nurse Practitioner

## 2018-10-13 MED ORDER — MELOXICAM 7.5 MG PO TABS: 7.5000 mg | ORAL_TABLET | Freq: Every day | ORAL | 1 refills | Status: DC

## 2018-10-13 MED ORDER — TIZANIDINE HCL 4 MG PO TABS: 4.0000 mg | ORAL_TABLET | Freq: Four times a day (QID) | ORAL | 1 refills | Status: DC | PRN

## 2018-10-27 ENCOUNTER — Other Ambulatory Visit: Payer: Self-pay | Admitting: Nurse Practitioner

## 2018-11-02 ENCOUNTER — Other Ambulatory Visit: Payer: Self-pay | Admitting: Nurse Practitioner

## 2018-12-07 ENCOUNTER — Other Ambulatory Visit: Payer: Self-pay | Admitting: Nurse Practitioner

## 2018-12-07 DIAGNOSIS — F411 Generalized anxiety disorder: Secondary | ICD-10-CM | POA: Diagnosis not present

## 2018-12-14 ENCOUNTER — Encounter: Payer: Self-pay | Admitting: Nurse Practitioner

## 2018-12-18 ENCOUNTER — Other Ambulatory Visit: Payer: Self-pay | Admitting: Nurse Practitioner

## 2018-12-18 DIAGNOSIS — Z130 Encounter for screening for diseases of the blood and blood-forming organs and certain disorders involving the immune mechanism: Secondary | ICD-10-CM

## 2018-12-18 DIAGNOSIS — Z13228 Encounter for screening for other metabolic disorders: Secondary | ICD-10-CM

## 2018-12-18 DIAGNOSIS — Z13220 Encounter for screening for lipoid disorders: Secondary | ICD-10-CM

## 2018-12-18 NOTE — Telephone Encounter (Signed)
Orders placed.

## 2018-12-21 ENCOUNTER — Encounter: Payer: Self-pay | Admitting: Nurse Practitioner

## 2018-12-21 NOTE — Telephone Encounter (Signed)
Spoke to patient and scheduled her to come in for labs.

## 2018-12-22 ENCOUNTER — Other Ambulatory Visit: Payer: Self-pay | Admitting: Nurse Practitioner

## 2018-12-22 ENCOUNTER — Other Ambulatory Visit: Payer: Self-pay

## 2018-12-22 ENCOUNTER — Ambulatory Visit: Payer: BC Managed Care – PPO | Attending: Family Medicine

## 2018-12-22 DIAGNOSIS — N939 Abnormal uterine and vaginal bleeding, unspecified: Secondary | ICD-10-CM

## 2018-12-22 DIAGNOSIS — Z130 Encounter for screening for diseases of the blood and blood-forming organs and certain disorders involving the immune mechanism: Secondary | ICD-10-CM | POA: Diagnosis not present

## 2018-12-22 DIAGNOSIS — Z13228 Encounter for screening for other metabolic disorders: Secondary | ICD-10-CM | POA: Diagnosis not present

## 2018-12-22 DIAGNOSIS — Z13220 Encounter for screening for lipoid disorders: Secondary | ICD-10-CM | POA: Diagnosis not present

## 2018-12-23 LAB — CBC
Hematocrit: 36.2 % (ref 34.0–46.6)
Hemoglobin: 11.9 g/dL (ref 11.1–15.9)
MCH: 28.1 pg (ref 26.6–33.0)
MCHC: 32.9 g/dL (ref 31.5–35.7)
MCV: 86 fL (ref 79–97)
Platelets: 367 10*3/uL (ref 150–450)
RBC: 4.23 x10E6/uL (ref 3.77–5.28)
RDW: 12.9 % (ref 11.7–15.4)
WBC: 6.8 10*3/uL (ref 3.4–10.8)

## 2018-12-23 LAB — CMP14+EGFR
ALT: 14 IU/L (ref 0–32)
AST: 14 IU/L (ref 0–40)
Albumin/Globulin Ratio: 1.6 (ref 1.2–2.2)
Albumin: 4.6 g/dL (ref 3.9–5.0)
Alkaline Phosphatase: 47 IU/L (ref 39–117)
BUN/Creatinine Ratio: 19 (ref 9–23)
BUN: 15 mg/dL (ref 6–20)
Bilirubin Total: 0.6 mg/dL (ref 0.0–1.2)
CO2: 22 mmol/L (ref 20–29)
Calcium: 9.4 mg/dL (ref 8.7–10.2)
Chloride: 104 mmol/L (ref 96–106)
Creatinine, Ser: 0.8 mg/dL (ref 0.57–1.00)
GFR calc Af Amer: 117 mL/min/{1.73_m2} (ref >59)
GFR calc non Af Amer: 101 mL/min/{1.73_m2} (ref >59)
Globulin, Total: 2.9 g/dL (ref 1.5–4.5)
Glucose: 90 mg/dL (ref 65–99)
Potassium: 4.3 mmol/L (ref 3.5–5.2)
Sodium: 140 mmol/L (ref 134–144)
Total Protein: 7.5 g/dL (ref 6.0–8.5)

## 2018-12-23 LAB — LIPID PANEL
Chol/HDL Ratio: 2.5 ratio (ref 0.0–4.4)
Cholesterol, Total: 176 mg/dL (ref 100–199)
HDL: 71 mg/dL (ref >39)
LDL Chol Calc (NIH): 90 mg/dL (ref 0–99)
Triglycerides: 81 mg/dL (ref 0–149)
VLDL Cholesterol Cal: 15 mg/dL (ref 5–40)

## 2018-12-23 LAB — HCG, SERUM, QUALITATIVE: hCG,Beta Subunit,Qual,Serum: NEGATIVE m[IU]/mL (ref <6)

## 2018-12-26 ENCOUNTER — Encounter: Payer: Self-pay | Admitting: Nurse Practitioner

## 2019-01-02 DIAGNOSIS — Z319 Encounter for procreative management, unspecified: Secondary | ICD-10-CM | POA: Diagnosis not present

## 2019-01-03 ENCOUNTER — Other Ambulatory Visit: Payer: Self-pay | Admitting: Nurse Practitioner

## 2019-01-03 NOTE — Telephone Encounter (Signed)
Attempt to reach patient to schedule for a flu shot. No answer and LVM for a call back.

## 2019-01-04 DIAGNOSIS — F411 Generalized anxiety disorder: Secondary | ICD-10-CM | POA: Diagnosis not present

## 2019-01-09 ENCOUNTER — Encounter: Payer: Self-pay | Admitting: Nurse Practitioner

## 2019-01-18 NOTE — Telephone Encounter (Signed)
Error

## 2019-01-19 ENCOUNTER — Other Ambulatory Visit: Payer: Self-pay

## 2019-01-19 ENCOUNTER — Ambulatory Visit: Payer: BC Managed Care – PPO | Attending: Nurse Practitioner | Admitting: Nurse Practitioner

## 2019-01-19 ENCOUNTER — Encounter: Payer: Self-pay | Admitting: Nurse Practitioner

## 2019-01-19 DIAGNOSIS — R109 Unspecified abdominal pain: Secondary | ICD-10-CM

## 2019-01-19 DIAGNOSIS — G8929 Other chronic pain: Secondary | ICD-10-CM

## 2019-01-19 DIAGNOSIS — M5441 Lumbago with sciatica, right side: Secondary | ICD-10-CM

## 2019-01-19 DIAGNOSIS — F419 Anxiety disorder, unspecified: Secondary | ICD-10-CM | POA: Diagnosis not present

## 2019-01-19 DIAGNOSIS — M279 Disease of jaws, unspecified: Secondary | ICD-10-CM

## 2019-01-19 MED ORDER — DICYCLOMINE HCL 10 MG PO CAPS: 10.0000 mg | ORAL_CAPSULE | Freq: Three times a day (TID) | ORAL | 0 refills | Status: DC

## 2019-01-19 MED ORDER — MELOXICAM 7.5 MG PO TABS: 7.5000 mg | ORAL_TABLET | Freq: Every day | ORAL | 1 refills | Status: DC

## 2019-01-19 MED ORDER — TIZANIDINE HCL 4 MG PO TABS: 4.0000 mg | ORAL_TABLET | Freq: Four times a day (QID) | ORAL | 1 refills | Status: DC | PRN
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
## 2019-01-21 ENCOUNTER — Encounter: Payer: Self-pay | Admitting: Nurse Practitioner

## 2019-02-01 ENCOUNTER — Other Ambulatory Visit: Payer: Self-pay | Admitting: Nurse Practitioner

## 2019-02-01 DIAGNOSIS — G8929 Other chronic pain: Secondary | ICD-10-CM

## 2019-02-04 ENCOUNTER — Encounter: Payer: Self-pay | Admitting: Nurse Practitioner

## 2019-02-05 ENCOUNTER — Ambulatory Visit (HOSPITAL_COMMUNITY)
Admission: RE | Admit: 2019-02-05 | Discharge: 2019-02-05 | Disposition: A | Discharge status: Home / Self Care | Payer: BC Managed Care – PPO | Source: Ambulatory Visit | Attending: Nurse Practitioner | Admitting: Nurse Practitioner

## 2019-02-05 ENCOUNTER — Other Ambulatory Visit: Payer: Self-pay

## 2019-02-05 DIAGNOSIS — R220 Localized swelling, mass and lump, head: Secondary | ICD-10-CM | POA: Diagnosis not present

## 2019-02-05 DIAGNOSIS — M279 Disease of jaws, unspecified: Secondary | ICD-10-CM | POA: Diagnosis not present

## 2019-02-05 DIAGNOSIS — M5441 Lumbago with sciatica, right side: Secondary | ICD-10-CM | POA: Insufficient documentation

## 2019-02-05 DIAGNOSIS — G8929 Other chronic pain: Secondary | ICD-10-CM | POA: Diagnosis not present

## 2019-02-05 DIAGNOSIS — M4185 Other forms of scoliosis, thoracolumbar region: Secondary | ICD-10-CM | POA: Diagnosis not present

## 2019-02-06 ENCOUNTER — Other Ambulatory Visit: Payer: Self-pay | Admitting: Nurse Practitioner

## 2019-02-06 DIAGNOSIS — M419 Scoliosis, unspecified: Secondary | ICD-10-CM

## 2019-02-06 DIAGNOSIS — IMO0002 Reserved for concepts with insufficient information to code with codable children: Secondary | ICD-10-CM

## 2019-02-06 DIAGNOSIS — G43709 Chronic migraine without aura, not intractable, without status migrainosus: Secondary | ICD-10-CM

## 2019-02-06 DIAGNOSIS — R220 Localized swelling, mass and lump, head: Secondary | ICD-10-CM

## 2019-02-18 ENCOUNTER — Other Ambulatory Visit: Payer: Self-pay | Admitting: Family Medicine

## 2019-02-18 DIAGNOSIS — G8929 Other chronic pain: Secondary | ICD-10-CM

## 2019-02-18 DIAGNOSIS — M5441 Lumbago with sciatica, right side: Secondary | ICD-10-CM

## 2019-02-19 ENCOUNTER — Encounter: Payer: Self-pay | Admitting: Nurse Practitioner

## 2019-02-20 NOTE — Telephone Encounter (Signed)
Please fill if appropriate.  

## 2019-02-21 ENCOUNTER — Other Ambulatory Visit: Payer: BC Managed Care – PPO

## 2019-02-22 NOTE — Telephone Encounter (Signed)
Spoke to patient. Currently waiting for Prior Auth approval from [place]. Will call patient once it is approve and will scheduled patient for imaging on Tuesday or Thursday after 2PM.

## 2019-02-27 ENCOUNTER — Other Ambulatory Visit: Payer: BC Managed Care – PPO

## 2019-03-06 ENCOUNTER — Encounter: Payer: Self-pay | Admitting: Nurse Practitioner

## 2019-03-07 ENCOUNTER — Ambulatory Visit: Payer: BC Managed Care – PPO | Attending: Internal Medicine

## 2019-03-07 DIAGNOSIS — Z20822 Contact with and (suspected) exposure to covid-19: Secondary | ICD-10-CM

## 2019-03-07 DIAGNOSIS — Z20828 Contact with and (suspected) exposure to other viral communicable diseases: Secondary | ICD-10-CM | POA: Diagnosis not present

## 2019-03-09 LAB — NOVEL CORONAVIRUS, NAA: SARS-CoV-2, NAA: NOT DETECTED

## 2019-03-28 ENCOUNTER — Ambulatory Visit: Payer: BC Managed Care – PPO | Admitting: Neurology

## 2019-03-29 DIAGNOSIS — F411 Generalized anxiety disorder: Secondary | ICD-10-CM | POA: Diagnosis not present

## 2019-04-06 ENCOUNTER — Encounter: Payer: Self-pay | Admitting: Physical Medicine & Rehabilitation

## 2019-04-28 ENCOUNTER — Encounter: Payer: Self-pay | Admitting: Nurse Practitioner

## 2019-04-28 ENCOUNTER — Other Ambulatory Visit: Payer: Self-pay | Admitting: Nurse Practitioner

## 2019-04-28 DIAGNOSIS — M5441 Lumbago with sciatica, right side: Secondary | ICD-10-CM

## 2019-04-28 DIAGNOSIS — G8929 Other chronic pain: Secondary | ICD-10-CM

## 2019-04-28 MED ORDER — TIZANIDINE HCL 4 MG PO TABS: ORAL_TABLET | ORAL | 3 refills | Status: DC

## 2019-05-01 ENCOUNTER — Encounter
Payer: BC Managed Care – PPO | Attending: Physical Medicine and Rehabilitation | Admitting: Physical Medicine and Rehabilitation

## 2019-05-01 ENCOUNTER — Other Ambulatory Visit: Payer: Self-pay

## 2019-05-01 ENCOUNTER — Encounter: Payer: Self-pay | Admitting: Physical Medicine and Rehabilitation

## 2019-05-01 ENCOUNTER — Encounter: Payer: BC Managed Care – PPO | Admitting: Physical Medicine & Rehabilitation

## 2019-05-01 VITALS — BP 123/73 | HR 96 | Temp 98.1°F | Ht 65.0 in | Wt 149.8 lb

## 2019-05-01 DIAGNOSIS — M4120 Other idiopathic scoliosis, site unspecified: Secondary | ICD-10-CM | POA: Diagnosis not present

## 2019-05-01 DIAGNOSIS — M7918 Myalgia, other site: Secondary | ICD-10-CM | POA: Diagnosis not present

## 2019-05-01 MED ORDER — METHOCARBAMOL 500 MG PO TABS: 500.0000 mg | ORAL_TABLET | Freq: Four times a day (QID) | ORAL | 1 refills | Status: DC

## 2019-05-01 NOTE — Progress Notes (Signed)
Subjective:    Patient ID: Stephanie Young, female    DOB: [birth year], 29 y.o.   MRN: [id number]  HPI  Mrs. Stephanie Young is a 29 year old woman who presents to establish care for her back pain.   She has had years of lumbar and thoracic paraspinal back pain that is worse on the right side. She has had scoliosis imaging done, that I have personally reviewed, and that shows mild scoliosis. She has been taking Tizanidine 4mg  HS for the pain and it helps but it makes her too sleepy if she takes it during the day.   She has never done PT, does not exercise, and feels that she does not have very good posture. Says it is more comfortable for her to slouch.  She is not interested in doing PT because she does not feel that she has the time. She works in a busy clinic, is studying to be a , and has to take her mother and grandmother to appointments.    Pain Inventory Average Pain 7 Pain Right Now 5 My pain is constant, sharp, burning, dull and aching  In the last 24 hours, has pain interfered with the following? General activity 6 Relation with others 3 Enjoyment of life 6 What TIME of day is your pain at its worst? evening and night Sleep (in general) Fair  Pain is worse with: inactivity, standing and some activites Pain improves with: medication Relief from Meds: 5  Mobility how many minutes can you walk? unlimited ability to climb steps?  yes do you drive?  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 Breast cancer Mother 72  . Hypertension Mother   . Hypertension Brother   . Diabetes Maternal Uncle   . Diabetes Other   . Colon cancer Neg Hx   . Colon polyps Neg Hx    Social History   Socioeconomic History  . Marital  status: Married    Spouse name: Not on file  . Number of children: Not on file  . Years of education: Not on file  . Highest education level: Not on file  Occupational History  . Not on file  Tobacco Use  . Smoking status: Former Smoker    Packs/day: 0.25    Years: 9.00    Pack years: 2.25    Types: Cigarettes    Quit date: 10/25/2017    Years since quitting: 1.5  . Smokeless tobacco: Never Used  Substance and Sexual Activity  . Alcohol use: Yes    Comment: socially   . Drug use: Not Currently    Types: Marijuana    Comment: none since 10/2017  . Sexual activity: Yes    Partners: Male    Birth control/protection: None  Other Topics Concern  . Not on file  Social History Narrative  . Not on file   Social Determinants of Health   Financial Resource Strain:   . Difficulty of Paying Living Expenses: Not on file  Food Insecurity:   . Worried About 11/2017 in the Last Year: Not on file  . Ran Out of Food in the Last Year: Not on file  Transportation Needs:   . Lack of Transportation (Medical): Not on file  . Lack of  Transportation (Non-Medical): Not on file  Physical Activity:   . Days of Exercise per Week: Not on file  . Minutes of Exercise per Session: Not on file  Stress:   . Feeling of Stress : Not on file  Social Connections:   . Frequency of Communication with Friends and Family: Not on file  . Frequency of Social Gatherings with Friends and Family: Not on file  . Attends Religious Services: Not on file  . Active Member of Clubs or Organizations: Not on file  . Attends Banker Meetings: Not on file  . Marital Status: Not on file   Past Surgical History:  Procedure Laterality Date  . dental procedure    . FLEXIBLE SIGMOIDOSCOPY N/A 01/30/2018   Procedure: FLEXIBLE SIGMOIDOSCOPY WITH PROPOFOL;  Surgeon: Corbin Ade, MD;  [facility];  Service: Endoscopy;  Laterality: N/A;  1:45pm   Past Medical History:  Diagnosis Date    . Anemia   . H. pylori infection   . History of kidney stones    BP 123/73   Pulse 96   Temp 98.1 F (36.7 C)   Ht 5\' 5"  (1.651 m)   Wt 149 lb 12.8 oz (67.9 kg)   SpO2 96%   BMI 24.93 kg/m   Opioid Risk Score:   Fall Risk Score:  `1  Depression screen PHQ 2/9  Depression screen [facility] 2/9 11/21/2017 08/23/2017 05/24/2017 03/25/2017 02/25/2017  Decreased Interest 0 0 0 0 0  Down, Depressed, Hopeless 0 0 0 0 0  PHQ - 2 Score 0 0 0 0 0  Altered sleeping 0 2 1 0 0  Tired, decreased energy 1 0 0 0 0  Change in appetite 0 0 1 2 1   Feeling bad or failure about yourself  0 0 0 0 0  Trouble concentrating 0 0 0 0 0  Moving slowly or fidgety/restless 0 0 0 0 0  Suicidal thoughts 0 0 0 0 0  PHQ-9 Score 1 2 2 2 1    Review of Systems  Musculoskeletal:       Spasms  Neurological: Positive for dizziness.  Psychiatric/Behavioral: The patient is nervous/anxious.   All other systems reviewed and are negative.      Objective:   Physical Exam Gen: no distress, normal appearing HEENT: oral mucosa pink and moist, NCAT Cardio: Reg rate Chest: normal effort, normal rate of breathing Abd: soft, non-distended Ext: no edema Skin: intact Neuro: AOx3.  Musculoskeletal: 5/5 strength throughout. Sensation is intact throughout. Mild tenderness to palpation along right lumbar and thoracic paraspinals. No spinal tenderness. Full flexion and extension with pain at end-flexion. Negative Slump test bilaterally.  Psych: pleasant, normal affect     Assessment & Plan:  Mrs. Stephanie Young is a 29 year old woman who presents to establish care for her back pain.   --Her right sided lumbar and thoracic back pain are likely secondary to myofascial pain arising from poor posture and decreased strength or core and paraspinal muscles. I recommended physical therapy for her but she does not feel that she has the time to do it. I provided her with a spinal exercise program and also recommended that she try yoga.  Provided her the name of an app that I like to use, Down Dog, and recommend she continue with either the spinal conditioning program or the app as a lot of the exercises overlap.  -She does not have any signs of spinal nerve root impingement.   -Prescribed Robaxin for  muscle spasm during the day as it should make her less sleepy.   RTC in 1 month to assess progress with the above interventions.

## 2019-05-03 ENCOUNTER — Encounter: Payer: Self-pay | Admitting: Neurology

## 2019-05-03 ENCOUNTER — Other Ambulatory Visit: Payer: Self-pay

## 2019-05-03 ENCOUNTER — Ambulatory Visit: Payer: BC Managed Care – PPO | Admitting: Neurology

## 2019-05-03 VITALS — BP 124/62 | HR 72 | Temp 97.8°F | Ht 65.0 in | Wt 149.0 lb

## 2019-05-03 DIAGNOSIS — R519 Headache, unspecified: Secondary | ICD-10-CM

## 2019-05-03 DIAGNOSIS — G4486 Cervicogenic headache: Secondary | ICD-10-CM

## 2019-05-03 DIAGNOSIS — G44209 Tension-type headache, unspecified, not intractable: Secondary | ICD-10-CM | POA: Diagnosis not present

## 2019-05-03 MED ORDER — AMITRIPTYLINE HCL 25 MG PO TABS: ORAL_TABLET | ORAL | 3 refills | Status: DC

## 2019-05-03 NOTE — Patient Instructions (Addendum)
Your headache description is more in keeping with tension headaches and headaches that stem from neck pain or shoulder pain.  Your neurological exam is normal thankfully.  I would recommend that you get your eye examination done because it has been 2 or 3 years since her last eye examination.  Please continue to follow-up with your physical medicine specialist.  Please be cautious with muscle relaxant as they can be sedating. For headache prevention, I suggest we start you on Elavil (generic name: amitriptyline) 25 mg: Take half a pill daily at bedtime for one week, then one pill daily at bedtime for one week, then one and a half pills daily at bedtime for one week, then 2 pills daily at bedtime thereafter. Common side effects reported are: mouth dryness, drowsiness, confusion, dizziness.  If you find that it is helping at a lower dose, you do not have to go to the 2 pills each day.  Please be aware that Cymbalta and Elavil are 2 different Antidepressant medications and very rarely can cause a potentially dangerous adverse reaction, called SEROTONIN SYNDROME: Symptoms of this condition include (but are not limited to):  Agitation or restlessness, confusion, rapid heart rate and high blood pressure, dilated pupils, loss of muscle coordination or twitching muscles, muscle rigidity/stiffness, sweating and/or flushing, diarrhea, headache, shivering, goose bumps. If you have any of these symptoms you may have to stop the medication. Call your health care provider immediately.  Severe serotonin syndrome can be life-threatening emergency. Signs and symptoms of a severe reaction may include: high fever, seizures, irregular heartbeat, unconsciousness or altered level of awareness or personality changes.  If you have any of these new symptoms, call 911 or have someone take you to the emergency room.   You are using a low-dose of these 2 medications, just to be cautious, I would not recommend that you take both at  the exact same time.

## 2019-05-03 NOTE — Progress Notes (Signed)
Subjective:    Patient ID: Stephanie Young is a 29 y.o. female.  HPI     Star Age, MD, PhD [facility] [address], [address]O. [place], [place] [address]  Dear Army Melia,   I saw your patient, Stephanie Young, upon your kind request in my neurologic clinic today for initial consultation of her recurrent headaches, concern for migraines.  The patient is unaccompanied today.  As you know, [name] is a 29 year old right-handed woman with an underlying medical history of anemia, chronic back pain, anxiety and scoliosis, who reports recurrent headaches for the past several years, sometimes her headaches are nearly daily and sometimes she can go weeks without 1.  She has a tightness and aching sensation, a dull pain typically either on one side or the other or the top of her head.  She does not typically have a significant throbbing on one side, no associated photophobia or sonophobia, no nausea or vomiting.  Very occasionally does she get light sensitivity with her headache.  She has been taking over-the-counter medication such as Tylenol or Excedrin.  She does not take ibuprofen daily.  She does take ibuprofen for menstrual cramps if anything.  Her mother has recurrent headaches but no label of migraines.  The patient denies any associated one-sided weakness or numbness or tingling or droopy face or slurring of speech.  She has recently been evaluated for back pain by physical medicine and has been placed on a new muscle relaxant, Robaxin.  She takes it once daily and also takes tizanidine at night as needed.  She does not take both together.  She has been on Cymbalta low-dose and BuSpar for anxiety.  She lives with her husband, her mother and her grandmother.  She has no children.  She works as a Quarry manager at a [employer].  She does not smoke cigarettes but has smoked marijuana before.  She drinks alcohol occasionally, maybe once a week, does not drink daily caffeine,  she tries to hydrate well with water.  She has prescription eyeglasses but has not had a formal eye examination in 2 to 3 years.  She is planning to get an eye examination done. I reviewed your office note from 01/19/2019. She reports neck pain and bilateral upper shoulder muscle pain at times. She does not have any telltale triggers for her headaches except for stress she admits.  She does try to sleep enough.  Her Past Medical History Is Significant For: Past Medical History:  Diagnosis Date  . Anemia   . H. pylori infection   . History of kidney stones     Her Past Surgical History Is Significant For: Past Surgical History:  Procedure Laterality Date  . dental procedure    . FLEXIBLE SIGMOIDOSCOPY N/A 01/30/2018   Procedure: FLEXIBLE SIGMOIDOSCOPY WITH PROPOFOL;  Surgeon: Daneil Dolin, MD;  [facility];  Service: Endoscopy;  Laterality: N/A;  1:45pm    Her Family History Is Significant For: Family History  Problem Relation Age of Onset  . Breast cancer Mother 61  . Hypertension Mother   . Hypertension Brother   . Diabetes Maternal Uncle   . Diabetes Other   . Colon cancer Neg Hx   . Colon polyps Neg Hx     Her Social History Is Significant For: Social History   Socioeconomic History  . Marital status: Married    Spouse name: Not on file  . Number of children: Not on file  .  Years of education: Not on file  . Highest education level: Not on file  Occupational History  . Not on file  Tobacco Use  . Smoking status: Former Smoker    Packs/day: 0.25    Years: 9.00    Pack years: 2.25    Types: Cigarettes    Quit date: 10/25/2017    Years since quitting: 1.5  . Smokeless tobacco: Never Used  Substance and Sexual Activity  . Alcohol use: Yes    Comment: socially   . Drug use: Not Currently    Types: Marijuana    Comment: none since 10/2017  . Sexual activity: Yes    Partners: Male    Birth control/protection: None  Other Topics Concern  . Not on  file  Social History Narrative  . Not on file   Social Determinants of Health   Financial Resource Strain:   . Difficulty of Paying Living Expenses: Not on file  Food Insecurity:   . Worried About Programme researcher, broadcasting/film/video in the Last Year: Not on file  . Ran Out of Food in the Last Year: Not on file  Transportation Needs:   . Lack of Transportation (Medical): Not on file  . Lack of Transportation (Non-Medical): Not on file  Physical Activity:   . Days of Exercise per Week: Not on file  . Minutes of Exercise per Session: Not on file  Stress:   . Feeling of Stress : Not on file  Social Connections:   . Frequency of Communication with Friends and Family: Not on file  . Frequency of Social Gatherings with Friends and Family: Not on file  . Attends Religious Services: Not on file  . Active Member of Clubs or Organizations: Not on file  . Attends Banker Meetings: Not on file  . Marital Status: Not on file    Her Allergies Are:  Allergies  Allergen Reactions  . Cephalosporins Rash    Cefepime  :   Her Current Medications Are:  Outpatient Encounter Medications as of 05/03/2019  Medication Sig  . acetaminophen (TYLENOL) 500 MG tablet Take 1,000 mg by mouth every 6 (six) hours as needed for moderate pain.   . busPIRone (BUSPAR) 5 MG tablet Take 5 mg by mouth 3 (three) times daily.  . DULoxetine (CYMBALTA) 20 MG capsule Take 20 mg by mouth daily.  . folic acid (FOLVITE) 1 MG tablet Take 1 mg by mouth daily.  . methocarbamol (ROBAXIN) 500 MG tablet Take 1 tablet (500 mg total) by mouth 4 (four) times daily.  . Multiple Vitamin (MULTIVITAMIN WITH MINERALS) TABS tablet Take 1 tablet by mouth daily.  Marland Kitchen tiZANidine (ZANAFLEX) 4 MG tablet TAKE 1 TABLET(4 MG) BY MOUTH EVERY 6 HOURS AS NEEDED FOR MUSCLE SPASMS  . triamcinolone ointment (KENALOG) 0.5 % Apply 1 application topically 2 (two) times daily.  . vitamin B-12 (CYANOCOBALAMIN) 500 MCG tablet Take 500 mcg by mouth daily.  Marland Kitchen  amitriptyline (ELAVIL) 25 MG tablet 1/2 pill each bedtime x 1 week, then 1 pill nightly x 1 week, then 1 1/2 pills nightly x 1 week, then 2 pills nightly thereafter.  . dicyclomine (BENTYL) 10 MG capsule Take 1 capsule (10 mg total) by mouth 3 (three) times daily before meals.   No facility-administered encounter medications on file as of 05/03/2019.  :   Review of Systems:  Out of a complete 14 point review of systems, all are reviewed and negative with the exception of these symptoms as listed  below: Review of Systems  Neurological:       Here to discuss migraine treatment. Reports migraines are sporadic. Some months she will have one almost daily, other months  2-3. Reports she typically will take otc supplementation to help.     Objective:  Neurological Exam  Physical Exam Physical Examination:   Vitals:   05/03/19 1412  BP: 124/62  Pulse: 72  Temp: 97.8 F (36.6 C)  SpO2: 96%    General Examination: The patient is a very pleasant 29 y.o. female in no acute distress. She appears well-developed and well-nourished and well groomed.   HEENT: Normocephalic, atraumatic, pupils are equal, round and reactive to light and accommodation. Corrective eyeglasses in place. Funduscopic exam is normal with sharp disc margins noted. Extraocular tracking is good without limitation to gaze excursion or nystagmus noted. Normal smooth pursuit is noted. Hearing is grossly intact. Face is symmetric with normal facial animation and normal facial sensation. Speech is clear with no dysarthria noted. There is no hypophonia. There is no lip, neck/head, jaw or voice tremor. Neck is supple with full range of passive and active motion. There are no carotid bruits on auscultation. Oropharynx exam reveals: no significant mouth dryness, good dental hygiene. Tongue protrudes centrally and palate elevates symmetrically.   Chest: Clear to auscultation without wheezing, rhonchi or crackles noted.  Heart: S1+S2+0,  regular and normal without murmurs, rubs or gallops noted.   Abdomen: Soft, non-tender and non-distended with normal bowel sounds appreciated on auscultation.  Extremities: There is no pitting edema in the distal lower extremities bilaterally.  Skin: Warm and dry without trophic changes noted.  Musculoskeletal: exam reveals no obvious joint deformities, tenderness or joint swelling or erythema.   Neurologically:  Mental status: The patient is awake, alert and oriented in all 4 spheres. Her immediate and remote memory, attention, language skills and fund of knowledge are appropriate. There is no evidence of aphasia, agnosia, apraxia or anomia. Speech is clear with normal prosody and enunciation. Thought process is linear. Mood is normal and affect is normal.  Cranial nerves II - XII are as described above under HEENT exam. In addition: shoulder shrug is normal with equal shoulder height noted. Motor exam: Normal bulk, strength and tone is noted. There is no drift, tremor or rebound. Romberg is negative. Reflexes are 2+ throughout. Babinski: Toes are flexor bilaterally. Fine motor skills and coordination: intact with normal finger taps, normal hand movements, normal rapid alternating patting, normal foot taps and normal foot agility.  Cerebellar testing: No dysmetria or intention tremor on finger to nose testing. Heel to shin is unremarkable bilaterally. There is no truncal or gait ataxia.  Sensory exam: intact to light touch, ibration, temperature sense in the upper and lower extremities.  Gait, station and balance: She stands easily. No veering to one side is noted. No leaning to one side is noted. Posture is age-appropriate and stance is narrow based. Gait shows normal stride length and normal pace. No problems turning are noted. Tandem walk is unremarkable.   Assessment and Plan:   In summary, MYLAN LENGYEL is a very pleasant 29 y.o.-year old female with an underlying medical history of  anemia, chronic back pain, anxiety and scoliosis, who Presents for evaluation of her recurrent headaches of several years duration. Her history is not compelling for migraine headaches.  She may have tension type headaches and also some degree of cervicogenic headaches.  She has been on a muscle relaxer, she was recently prescribed a second  type of muscle relaxer, she is cautioned regarding taking 2 muscle relaxers as they can be sedating, she has been very cautious with the usage, takes the Robaxin only once a day and takes the tizanidine only once a night.  She is advised regarding headache triggers.  She is advised to start low-dose amitriptyline for headache prevention.  We talked about the importance of healthy lifestyle, stress management, good hydration, getting enough rest at night.Her exam is nonfocal thankfully.  Nevertheless, she is encouraged to make an appointment for a formal eye examination as it has been about 2 or 3 years since her last eye exam.  She is agreeable. She is taking Cymbalta and low dose, she was cautioned regarding the concomitant use of 2 different antidepressants with the rare possibility of serotonin syndrome, she was given Instructions in writing.  I will start her on amitriptyline 25 mg strength half a pill at bedtime and we will gradually increase to up to 50 mg at bedtime.  She is advised that she can stay on a lower dose if she feels it is effective.  I advised her to follow-up routinely to see one of our nurse practitioners in 3 months, sooner if needed.  I answered all her questions today and she was in agreement.   Thank you very much for allowing me to participate in the care of this nice patient. If I can be of any further assistance to you please do not hesitate to call me at [phone].  Sincerely,   Huston Foley, MD, PhD

## 2019-05-20 ENCOUNTER — Other Ambulatory Visit: Payer: Self-pay | Admitting: Nurse Practitioner

## 2019-05-20 DIAGNOSIS — R109 Unspecified abdominal pain: Secondary | ICD-10-CM

## 2019-05-21 NOTE — Telephone Encounter (Signed)
Please fill if patient may continue use. 

## 2019-05-23 ENCOUNTER — Other Ambulatory Visit: Payer: Self-pay | Admitting: Nurse Practitioner

## 2019-05-23 DIAGNOSIS — G8929 Other chronic pain: Secondary | ICD-10-CM

## 2019-05-29 ENCOUNTER — Encounter
Payer: BC Managed Care – PPO | Attending: Physical Medicine and Rehabilitation | Admitting: Physical Medicine and Rehabilitation

## 2019-05-29 DIAGNOSIS — M7918 Myalgia, other site: Secondary | ICD-10-CM | POA: Insufficient documentation

## 2019-05-29 DIAGNOSIS — M4120 Other idiopathic scoliosis, site unspecified: Secondary | ICD-10-CM | POA: Insufficient documentation

## 2019-06-12 ENCOUNTER — Telehealth: Payer: Self-pay | Admitting: Nurse Practitioner

## 2019-06-12 NOTE — Telephone Encounter (Signed)
Pt came in the office and asked for something for her allergies to be sent to [facility] on [address]. Please call and let pt know you can leave a vm

## 2019-06-14 DIAGNOSIS — Z6824 Body mass index (BMI) 24.0-24.9, adult: Secondary | ICD-10-CM | POA: Diagnosis not present

## 2019-06-14 DIAGNOSIS — Z3169 Encounter for other general counseling and advice on procreation: Secondary | ICD-10-CM | POA: Diagnosis not present

## 2019-06-14 DIAGNOSIS — Z01419 Encounter for gynecological examination (general) (routine) without abnormal findings: Secondary | ICD-10-CM | POA: Diagnosis not present

## 2019-06-16 ENCOUNTER — Other Ambulatory Visit: Payer: Self-pay | Admitting: Nurse Practitioner

## 2019-06-16 MED ORDER — CETIRIZINE HCL 10 MG PO TABS: 10.0000 mg | ORAL_TABLET | Freq: Every day | ORAL | 11 refills | Status: DC

## 2019-06-16 MED ORDER — FLUTICASONE PROPIONATE 50 MCG/ACT NA SUSP: 1.0000 | Freq: Every day | NASAL | 0 refills | Status: DC

## 2019-06-16 NOTE — Telephone Encounter (Signed)
SENT 

## 2019-06-19 DIAGNOSIS — F411 Generalized anxiety disorder: Secondary | ICD-10-CM | POA: Diagnosis not present

## 2019-06-26 DIAGNOSIS — Z130 Encounter for screening for diseases of the blood and blood-forming organs and certain disorders involving the immune mechanism: Secondary | ICD-10-CM | POA: Diagnosis not present

## 2019-06-26 DIAGNOSIS — Z1329 Encounter for screening for other suspected endocrine disorder: Secondary | ICD-10-CM | POA: Diagnosis not present

## 2019-06-26 DIAGNOSIS — Z0000 Encounter for general adult medical examination without abnormal findings: Secondary | ICD-10-CM | POA: Diagnosis not present

## 2019-06-26 DIAGNOSIS — Z13220 Encounter for screening for lipoid disorders: Secondary | ICD-10-CM | POA: Diagnosis not present

## 2019-07-14 ENCOUNTER — Other Ambulatory Visit: Payer: Self-pay | Admitting: Nurse Practitioner

## 2019-07-21 ENCOUNTER — Other Ambulatory Visit: Payer: Self-pay | Admitting: Nurse Practitioner

## 2019-07-21 DIAGNOSIS — M5441 Lumbago with sciatica, right side: Secondary | ICD-10-CM

## 2019-07-30 ENCOUNTER — Other Ambulatory Visit: Payer: Self-pay

## 2019-07-30 MED ORDER — AMITRIPTYLINE HCL 25 MG PO TABS: ORAL_TABLET | ORAL | 3 refills | Status: DC

## 2019-08-01 ENCOUNTER — Encounter: Payer: Self-pay | Admitting: Nurse Practitioner

## 2019-08-02 ENCOUNTER — Ambulatory Visit: Payer: BC Managed Care – PPO | Admitting: Family Medicine

## 2019-08-12 ENCOUNTER — Other Ambulatory Visit: Payer: Self-pay | Admitting: Family Medicine

## 2019-08-14 ENCOUNTER — Other Ambulatory Visit: Payer: Self-pay | Admitting: Nurse Practitioner

## 2019-08-14 DIAGNOSIS — R109 Unspecified abdominal pain: Secondary | ICD-10-CM

## 2019-09-12 ENCOUNTER — Other Ambulatory Visit: Payer: Self-pay | Admitting: Nurse Practitioner

## 2019-09-12 DIAGNOSIS — G8929 Other chronic pain: Secondary | ICD-10-CM

## 2019-09-12 NOTE — Telephone Encounter (Signed)
Requested  medications are  due for refill today yes  Requested medications are on the active medication list yes  Last refill 3/11  Last visit 11/13  Notes to clinic Not Delegated

## 2019-09-12 NOTE — Telephone Encounter (Signed)
Requested Prescriptions  Pending Prescriptions Disp Refills  . fluticasone (FLONASE) 50 MCG/ACT nasal spray [Pharmacy Med Name: FLUTICASONE NASAL SP (120) RX] 48 g 0    Sig: SHAKE LIQUID AND USE 1 SPRAY IN EACH NOSTRIL DAILY     Ear, Nose, and Throat: Nasal Preparations - Corticosteroids Passed - 09/12/2019  4:10 AM      Passed - Valid encounter within last 12 months    Recent Outpatient Visits          7 months ago Chronic right-sided low back pain with right-sided sciatica   [facility] [place], [place], NP   1 year ago Chronic midline low back pain with right-sided sciatica   [facility] [place] Rigg, NP   1 year ago Multifocal pneumonia   [facility] [place], Shea Stakes, NP   1 year ago Annual physical exam   [facility] [place], Shea Stakes, NP   2 years ago Encounter for Papanicolaou smear for cervical cancer screening   [facility] [place], Shea Stakes, NP

## 2019-09-18 ENCOUNTER — Other Ambulatory Visit: Payer: Self-pay | Admitting: Nurse Practitioner

## 2019-09-18 DIAGNOSIS — M5441 Lumbago with sciatica, right side: Secondary | ICD-10-CM

## 2019-09-18 DIAGNOSIS — F411 Generalized anxiety disorder: Secondary | ICD-10-CM | POA: Diagnosis not present

## 2019-09-18 NOTE — Telephone Encounter (Signed)
Requested medications are due for refill today?  Yes  Requested medications are on active medication list?  Yes  Last Refill:  09/13/2019  # 30 with no refills  Future visit scheduled?   Yes  Notes to Clinic:  This medication refill cannot be delegated.

## 2019-10-15 ENCOUNTER — Ambulatory Visit: Payer: BC Managed Care – PPO | Admitting: Nurse Practitioner

## 2019-10-30 ENCOUNTER — Encounter: Payer: Self-pay | Admitting: Family Medicine

## 2019-10-30 ENCOUNTER — Ambulatory Visit: Payer: BC Managed Care – PPO | Admitting: Family Medicine

## 2019-11-20 ENCOUNTER — Other Ambulatory Visit: Payer: Self-pay

## 2019-11-20 MED ORDER — AMITRIPTYLINE HCL 25 MG PO TABS: ORAL_TABLET | ORAL | 0 refills | Status: DC

## 2019-11-23 ENCOUNTER — Other Ambulatory Visit: Payer: Self-pay | Admitting: Nurse Practitioner

## 2019-11-29 ENCOUNTER — Encounter: Payer: Self-pay | Admitting: Nurse Practitioner

## 2019-11-30 ENCOUNTER — Other Ambulatory Visit: Payer: Self-pay

## 2019-11-30 ENCOUNTER — Other Ambulatory Visit: Payer: Self-pay | Admitting: Nurse Practitioner

## 2019-11-30 ENCOUNTER — Ambulatory Visit: Payer: BC Managed Care – PPO | Attending: Nurse Practitioner

## 2019-11-30 DIAGNOSIS — Z862 Personal history of diseases of the blood and blood-forming organs and certain disorders involving the immune mechanism: Secondary | ICD-10-CM

## 2019-11-30 DIAGNOSIS — Z13220 Encounter for screening for lipoid disorders: Secondary | ICD-10-CM

## 2019-11-30 DIAGNOSIS — R109 Unspecified abdominal pain: Secondary | ICD-10-CM

## 2019-11-30 DIAGNOSIS — G8929 Other chronic pain: Secondary | ICD-10-CM

## 2019-11-30 MED ORDER — TIZANIDINE HCL 4 MG PO TABS: 4.0000 mg | ORAL_TABLET | Freq: Four times a day (QID) | ORAL | 1 refills | Status: DC | PRN

## 2019-11-30 MED ORDER — DICYCLOMINE HCL 10 MG PO CAPS: 10.0000 mg | ORAL_CAPSULE | Freq: Three times a day (TID) | ORAL | 0 refills | Status: DC

## 2019-11-30 MED FILL — DICYCLOMINE 10 MG CAPSULE: 10 | 30 days supply | Qty: 90 | Fill #0

## 2019-11-30 MED FILL — tiZANidine HCL 4 MG TABS: 4 | 30 days supply | Qty: 30 | Fill #0

## 2019-12-01 LAB — CBC
Hematocrit: 35.5 % (ref 34.0–46.6)
Hemoglobin: 11.4 g/dL (ref 11.1–15.9)
MCH: 27.1 pg (ref 26.6–33.0)
MCHC: 32.1 g/dL (ref 31.5–35.7)
MCV: 85 fL (ref 79–97)
Platelets: 429 x10E3/uL (ref 150–450)
RBC: 4.2 x10E6/uL (ref 3.77–5.28)
RDW: 12.8 % (ref 11.7–15.4)
WBC: 6.6 x10E3/uL (ref 3.4–10.8)

## 2019-12-01 LAB — LIPID PANEL
Chol/HDL Ratio: 2.6 ratio (ref 0.0–4.4)
Cholesterol, Total: 185 mg/dL (ref 100–199)
HDL: 72 mg/dL
LDL Chol Calc (NIH): 97 mg/dL (ref 0–99)
Triglycerides: 89 mg/dL (ref 0–149)
VLDL Cholesterol Cal: 16 mg/dL (ref 5–40)

## 2019-12-01 LAB — BASIC METABOLIC PANEL WITH GFR
BUN/Creatinine Ratio: 10 (ref 9–23)
BUN: 8 mg/dL (ref 6–20)
CO2: 25 mmol/L (ref 20–29)
Calcium: 9.3 mg/dL (ref 8.7–10.2)
Chloride: 101 mmol/L (ref 96–106)
Creatinine, Ser: 0.77 mg/dL (ref 0.57–1.00)
GFR calc Af Amer: 122 mL/min/1.73
GFR calc non Af Amer: 105 mL/min/1.73
Glucose: 77 mg/dL (ref 65–99)
Potassium: 3.5 mmol/L (ref 3.5–5.2)
Sodium: 143 mmol/L (ref 134–144)

## 2019-12-04 ENCOUNTER — Ambulatory Visit: Payer: BC Managed Care – PPO

## 2019-12-05 ENCOUNTER — Other Ambulatory Visit: Payer: Self-pay

## 2019-12-05 ENCOUNTER — Ambulatory Visit: Payer: BC Managed Care – PPO | Attending: Nurse Practitioner

## 2019-12-05 VITALS — Temp 97.9°F

## 2019-12-05 DIAGNOSIS — Z111 Encounter for screening for respiratory tuberculosis: Secondary | ICD-10-CM

## 2019-12-06 ENCOUNTER — Other Ambulatory Visit: Payer: Self-pay | Admitting: Nurse Practitioner

## 2019-12-06 DIAGNOSIS — G8929 Other chronic pain: Secondary | ICD-10-CM

## 2019-12-07 ENCOUNTER — Ambulatory Visit: Payer: BC Managed Care – PPO | Attending: Nurse Practitioner

## 2019-12-07 ENCOUNTER — Other Ambulatory Visit: Payer: Self-pay

## 2019-12-07 LAB — TB SKIN TEST
Induration: 2 mm
TB Skin Test: NEGATIVE

## 2019-12-07 NOTE — Progress Notes (Signed)
Pt in clinic for PPD reading, results interpreted as neg with a 42mm induration. Letter printed for pt and sent via MyChart. Pt advised to contact clinic if any noted changes to site, pt verbalized understanding.

## 2019-12-11 DIAGNOSIS — F411 Generalized anxiety disorder: Secondary | ICD-10-CM | POA: Diagnosis not present

## 2019-12-12 ENCOUNTER — Other Ambulatory Visit: Payer: Self-pay | Admitting: Nurse Practitioner

## 2019-12-12 DIAGNOSIS — G8929 Other chronic pain: Secondary | ICD-10-CM

## 2019-12-12 NOTE — Telephone Encounter (Signed)
Requested medications are due for refill today?  Yes - This refill cannot be delegated.    Requested medications are on active medication list?  Yes  Last Refill:   11/30/2019  # 30 with one refill   Future visit scheduled?  No   Notes to Clinic:  This refill cannot be delegated.  Last office visit was 10 months ago.  Noted no future appointments scheduled.

## 2019-12-22 ENCOUNTER — Other Ambulatory Visit: Payer: Self-pay | Admitting: Neurology

## 2019-12-24 ENCOUNTER — Encounter: Payer: Self-pay | Admitting: Neurology

## 2019-12-24 ENCOUNTER — Encounter: Payer: Self-pay | Admitting: Nurse Practitioner

## 2019-12-25 ENCOUNTER — Telehealth: Payer: Self-pay | Admitting: Family Medicine

## 2020-01-22 ENCOUNTER — Other Ambulatory Visit: Payer: Self-pay | Admitting: Neurology

## 2020-01-31 IMAGING — DX DG MANDIBLE 4+V
5 series · 5 of 5 positions shown · non-contrast
Comparison: None.

CLINICAL DATA: 28-year-old female with left mandibular nodule.

EXAM:
MANDIBLE - 4+ VIEW

[mandible pa]
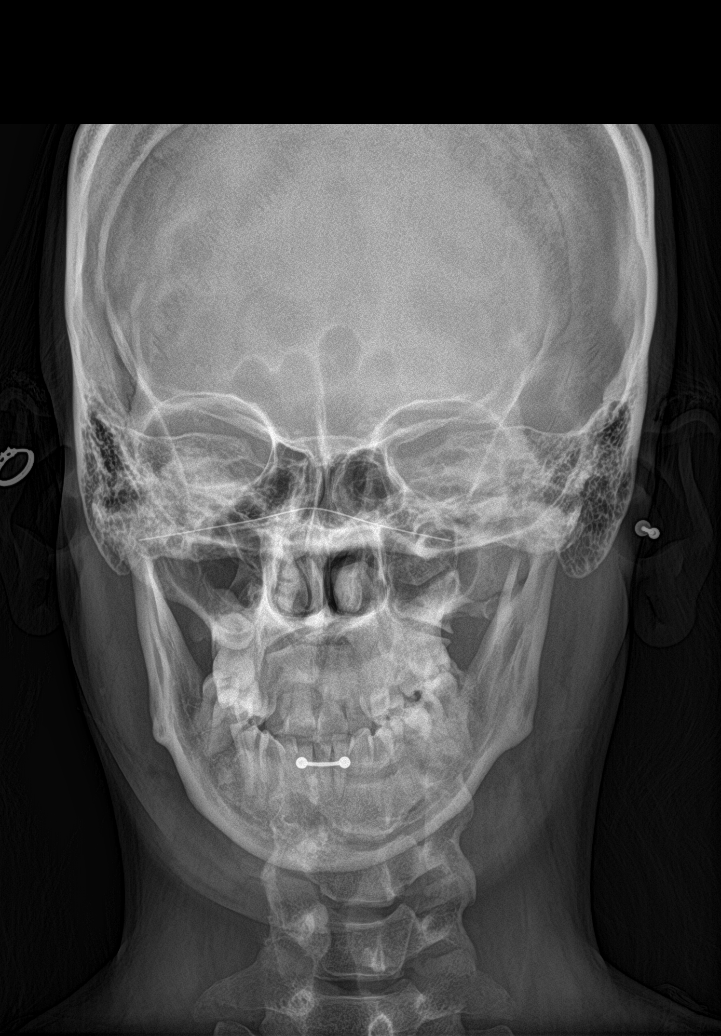

[mandible townes]
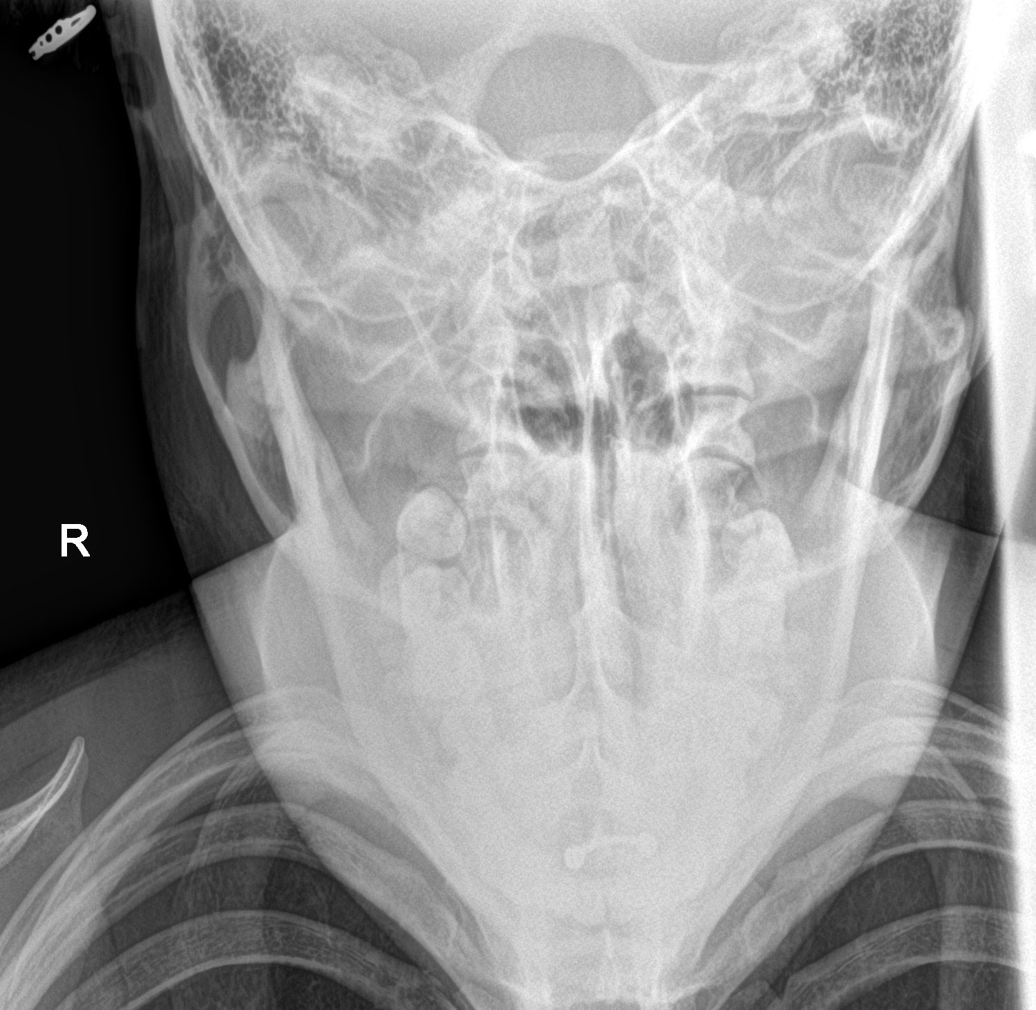

[skull lat (1 of 3)]
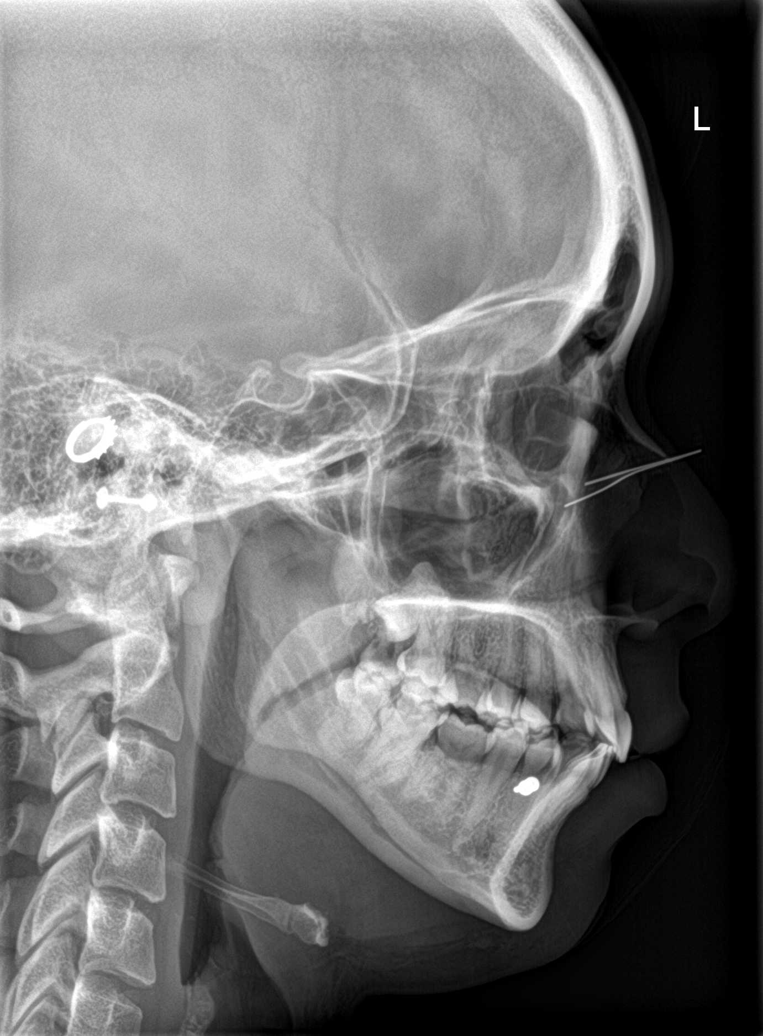

[skull lat (2 of 3)]
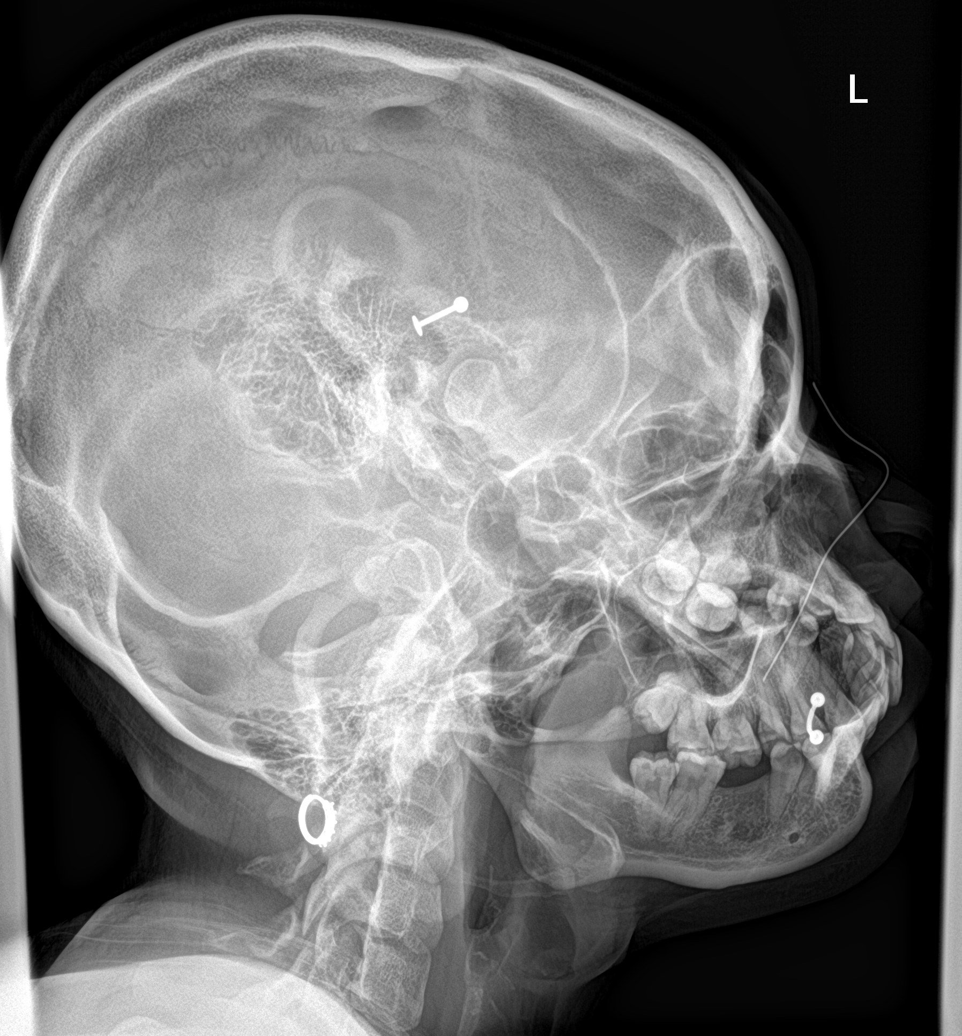

[skull lat (3 of 3)]
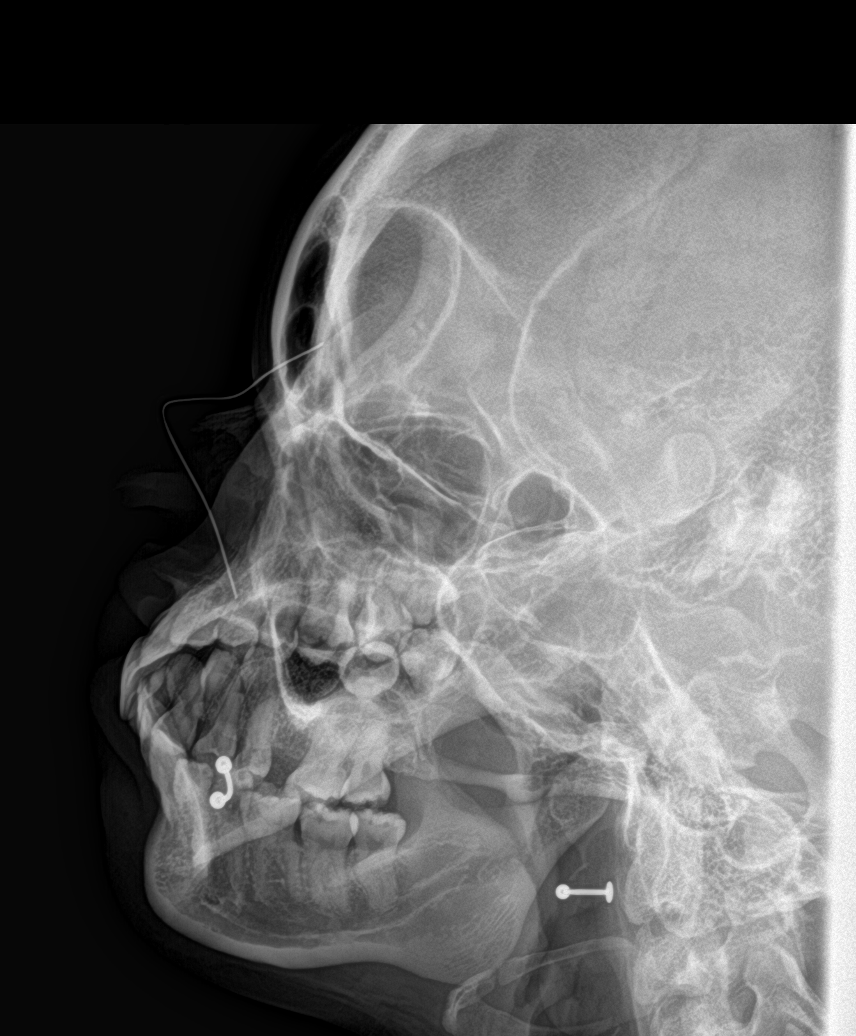

[5 of 5 positions shown; findings below may reference images not displayed]

FINDINGS: There is no acute fracture or dislocation. The visualized paranasal
sinuses and mastoid air cells are clear. Slightly distended
epiglottic airway. The soft tissues are grossly unremarkable.
IMPRESSION: 1. No acute fracture or dislocation.
2. Slight distension of the epiglottic air.

## 2020-02-27 ENCOUNTER — Other Ambulatory Visit: Payer: Self-pay | Admitting: Family Medicine

## 2020-02-27 DIAGNOSIS — M5441 Lumbago with sciatica, right side: Secondary | ICD-10-CM

## 2020-02-27 NOTE — Telephone Encounter (Signed)
Attempted to call patient to schedule appointment- left message to call office. Courtesy RF #[phone] given 

## 2020-03-16 ENCOUNTER — Other Ambulatory Visit: Payer: Self-pay | Admitting: Nurse Practitioner

## 2020-03-16 ENCOUNTER — Other Ambulatory Visit: Payer: Self-pay | Admitting: Neurology

## 2020-03-16 DIAGNOSIS — G8929 Other chronic pain: Secondary | ICD-10-CM

## 2020-03-17 NOTE — Telephone Encounter (Signed)
Requested medication (s) are due for refill today: no  Requested medication (s) are on the active medication list: yes  Last refill:  02/27/2020  Future visit scheduled: no  Notes to clinic:  overdue for follow up Patient has been given courtesy refill and been contact several times to schedule    Requested Prescriptions  Pending Prescriptions Disp Refills   meloxicam (MOBIC) 7.5 MG tablet [Pharmacy Med Name: MELOXICAM 7.5MG  TABLETS] 30 tablet 0    Sig: TAKE 1 TABLET BY MOUTH EVERY DAY      Analgesics:  COX2 Inhibitors Failed - 03/16/2020 10:38 AM      Failed - Valid encounter within last 12 months    Recent Outpatient Visits           1 year ago Chronic right-sided low back pain with right-sided sciatica   [facility] [place], Shea Stakes, NP   1 year ago Chronic midline low back pain with right-sided sciatica   [facility] [place], Shea Stakes, NP   2 years ago Multifocal pneumonia   [facility] [place], Shea Stakes, NP   2 years ago Annual physical exam   [facility] [place], [place] W, NP   2 years ago Encounter for Papanicolaou smear for cervical cancer screening   [facility] [place], [place] W, NP                Passed - HGB in normal range and within 360 days    Hemoglobin  Date Value Ref Range Status  11/30/2019 11.4 11.1 - 15.9 g/dL Final          Passed - Cr in normal range and within 360 days    Creatinine, Ser  Date Value Ref Range Status  11/30/2019 0.77 0.57 - 1.00 mg/dL Final          Passed - Patient is not pregnant        tiZANidine (ZANAFLEX) 4 MG tablet [Pharmacy Med Name: TIZANIDINE 4MG  TABLETS] 30 tablet 1    Sig: TAKE 1 TABLET(4 MG) BY MOUTH EVERY 6 HOURS AS NEEDED FOR MUSCLE SPASMS      Not Delegated - Cardiovascular:  Alpha-2 Agonists - tizanidine Failed - 03/16/2020 10:38 AM      Failed - This refill  cannot be delegated      Failed - Valid encounter within last 6 months    Recent Outpatient Visits           1 year ago Chronic right-sided low back pain with right-sided sciatica   [facility] [place], [place], NP   1 year ago Chronic midline low back pain with right-sided sciatica   [facility] [place], [place], NP   2 years ago Multifocal pneumonia   [facility] [place], [place], NP   2 years ago Annual physical exam   [facility] [place], [place], NP   2 years ago Encounter for Papanicolaou smear for cervical cancer screening   [facility] [place], [place], NP

## 2020-03-18 ENCOUNTER — Encounter: Payer: Self-pay | Admitting: Nurse Practitioner

## 2020-03-18 ENCOUNTER — Other Ambulatory Visit: Payer: Self-pay | Admitting: Nurse Practitioner

## 2020-03-18 ENCOUNTER — Encounter: Payer: Self-pay | Admitting: Neurology

## 2020-03-18 DIAGNOSIS — G8929 Other chronic pain: Secondary | ICD-10-CM

## 2020-03-18 DIAGNOSIS — M5441 Lumbago with sciatica, right side: Secondary | ICD-10-CM

## 2020-03-18 MED ORDER — MELOXICAM 7.5 MG PO TABS
7.5000 mg | ORAL_TABLET | Freq: Every day | ORAL | 0 refills | Status: AC
Start: 2020-03-18 — End: ?

## 2020-03-18 MED ORDER — BENZONATATE 200 MG PO CAPS: 200.0000 mg | ORAL_CAPSULE | Freq: Two times a day (BID) | ORAL | 0 refills | Status: DC | PRN

## 2020-03-18 MED ORDER — TIZANIDINE HCL 4 MG PO TABS: ORAL_TABLET | ORAL | 1 refills | Status: DC

## 2020-03-20 ENCOUNTER — Encounter: Payer: Self-pay | Admitting: Neurology

## 2020-03-20 ENCOUNTER — Telehealth (INDEPENDENT_AMBULATORY_CARE_PROVIDER_SITE_OTHER): Payer: BC Managed Care – PPO | Admitting: Neurology

## 2020-03-20 DIAGNOSIS — G44209 Tension-type headache, unspecified, not intractable: Secondary | ICD-10-CM

## 2020-03-20 DIAGNOSIS — F411 Generalized anxiety disorder: Secondary | ICD-10-CM | POA: Diagnosis not present

## 2020-03-20 MED ORDER — AMITRIPTYLINE HCL 25 MG PO TABS: 25.0000 mg | ORAL_TABLET | Freq: Every day | ORAL | 1 refills | Status: DC

## 2020-03-20 NOTE — Progress Notes (Signed)
Interim history:   Ms. Axelrod is a 30 year old right-handed woman with an underlying medical history of anemia, chronic back pain, anxiety and scoliosis, who presents for a virtual, phone based visit (MyChart video visit was not working from her end) for follow-up consultation of her recurrent headaches after nearly 1 year.  Of note, she canceled 2 interim appointments with the NP and no showed for an appointment on 10/30/2019, also with Debbora Presto, NP.  I first met her at the request of her primary care nurse practitioner, Geryl Rankins, at which time the patient reported a several week history of recurrent headaches.  She describes a dull and achy sensation on either side of her head or top of her head, no significant throbbing sensation, no photophobia or sonophobia or nausea or vomiting.  She had seen physical medicine for neck pain and prescribed a muscle relaxer.  She was on 2 different muscle relaxers.  She was cautioned regarding the sedating properties of muscle relaxers.  She was on Cymbalta low-dose and BuSpar for anxiety.  Her history and presentation was more in keeping with tension headaches and cervicogenic headaches as opposed to migraines. I suggested a low-dose of amitriptyline with gradual titration.  She was advised to stay on the lowest effective dose.  She messaged in the interim in October 2021 reporting lightheadedness upon standing.  Today, 03/20/20: She reports that she was recently diagnosed with COVID, her symptoms are mild including sore throat.  Today she feels better.  She has had headaches more consistently leading up to the COVID diagnosis.  For her headaches, she continues to take amitriptyline but takes it as needed, sometimes she only takes it at night and sometimes 25 mg twice daily.  She has had lightheadedness upon standing up quickly.  She has tried to stand up really slowly.  1 time she fell, she feels lightheadedness particularly at night.  Of note, she continues to  take Cymbalta 20 mg daily and takes the BuSpar 5 mg twice daily.  She takes tizanidine as needed and rarely she takes Robaxin as needed.  She has taken Tylenol for recurrent headaches.  She has not had a recent checkup with her primary care, she has not had time, she had a change in her job and has been very busy.  She had some blood work she reports but I could not see any recent blood work in her chart or care everywhere.  She says that she was mildly anemic.  She has not had any new neurological symptoms.  She does not have light sensitivity or nausea or vomiting.  She has generally tolerated the amitriptyline.  Previously:   05/03/19: (She) reports recurrent headaches for the past several years, sometimes her headaches are nearly daily and sometimes she can go weeks without 1.  She has a tightness and aching sensation, a dull pain typically either on one side or the other or the top of her head.  She does not typically have a significant throbbing on one side, no associated photophobia or sonophobia, no nausea or vomiting.  Very occasionally does she get light sensitivity with her headache.  She has been taking over-the-counter medication such as Tylenol or Excedrin.  She does not take ibuprofen daily.  She does take ibuprofen for menstrual cramps if anything.  Her mother has recurrent headaches but no label of migraines.  The patient denies any associated one-sided weakness or numbness or tingling or droopy face or slurring of speech.  She has  recently been evaluated for back pain by physical medicine and has been placed on a new muscle relaxant, Robaxin.  She takes it once daily and also takes tizanidine at night as needed.  She does not take both together.  She has been on Cymbalta low-dose and BuSpar for anxiety.  She lives with her husband, her mother and her grandmother.  She has no children.  She works as a Quarry manager at a [employer].  She does not smoke cigarettes but has smoked marijuana before.  She  drinks alcohol occasionally, maybe once a week, does not drink daily caffeine, she tries to hydrate well with water.  She has prescription eyeglasses but has not had a formal eye examination in 2 to 3 years.  She is planning to get an eye examination done. I reviewed your office note from 01/19/2019. She reports neck pain and bilateral upper shoulder muscle pain at times. She does not have any telltale triggers for her headaches except for stress she admits.  She does try to sleep enough.   Her Past Medical History Is Significant For: Past Medical History:  Diagnosis Date  . Anemia   . H. pylori infection   . History of kidney stones     Her Past Surgical History Is Significant For: Past Surgical History:  Procedure Laterality Date  . dental procedure    . FLEXIBLE SIGMOIDOSCOPY N/A 01/30/2018   Procedure: FLEXIBLE SIGMOIDOSCOPY WITH PROPOFOL;  Surgeon: Daneil Dolin, MD;  [facility];  Service: Endoscopy;  Laterality: N/A;  1:45pm    Her Family History Is Significant For: Family History  Problem Relation Age of Onset  . Breast cancer Mother 50  . Hypertension Mother   . Hypertension Brother   . Diabetes Maternal Uncle   . Diabetes Other   . Colon cancer Neg Hx   . Colon polyps Neg Hx     Her Social History Is Significant For: Social History   Socioeconomic History  . Marital status: Married    Spouse name: Not on file  . Number of children: Not on file  . Years of education: Not on file  . Highest education level: Not on file  Occupational History  . Not on file  Tobacco Use  . Smoking status: Former Smoker    Packs/day: 0.25    Years: 9.00    Pack years: 2.25    Types: Cigarettes    Quit date: 10/25/2017    Years since quitting: 2.4  . Smokeless tobacco: Never Used  Vaping Use  . Vaping Use: Never used  Substance and Sexual Activity  . Alcohol use: Yes    Comment: socially   . Drug use: Not Currently    Types: Marijuana    Comment: none since  10/2017  . Sexual activity: Yes    Partners: Male    Birth control/protection: None  Other Topics Concern  . Not on file  Social History Narrative  . Not on file   Social Determinants of Health   Financial Resource Strain: Not on file  Food Insecurity: Not on file  Transportation Needs: Not on file  Physical Activity: Not on file  Stress: Not on file  Social Connections: Not on file    Her Allergies Are:  Allergies  Allergen Reactions  . Cephalosporins Rash    Cefepime  :   Her Current Medications Are:  Outpatient Encounter Medications as of 03/20/2020  Medication Sig  . acetaminophen (TYLENOL) 500 MG tablet Take 1,000  mg by mouth every 6 (six) hours as needed for moderate pain.   Marland Kitchen amitriptyline (ELAVIL) 25 MG tablet TAKE 2 TABLETS BY MOUTH EVERY NIGHT  . benzonatate (TESSALON) 200 MG capsule Take 1 capsule (200 mg total) by mouth 2 (two) times daily as needed for cough.  . busPIRone (BUSPAR) 5 MG tablet Take 5 mg by mouth 3 (three) times daily.  . cetirizine (ZYRTEC) 10 MG tablet Take 1 tablet (10 mg total) by mouth daily.  Marland Kitchen dicyclomine (BENTYL) 10 MG capsule Take 1 capsule (10 mg total) by mouth 3 (three) times daily before meals. Must have office visit for refills  . DULoxetine (CYMBALTA) 20 MG capsule Take 20 mg by mouth daily.  . fluticasone (FLONASE) 50 MCG/ACT nasal spray SHAKE LIQUID AND USE 1 SPRAY IN EACH NOSTRIL DAILY  . folic acid (FOLVITE) 1 MG tablet Take 1 mg by mouth daily.  . meloxicam (MOBIC) 7.5 MG tablet Take 1 tablet (7.5 mg total) by mouth daily.  . Multiple Vitamin (MULTIVITAMIN WITH MINERALS) TABS tablet Take 1 tablet by mouth daily.  Marland Kitchen tiZANidine (ZANAFLEX) 4 MG tablet TAKE 1 TABLET(4 MG) BY MOUTH EVERY 6 HOURS AS NEEDED FOR MUSCLE SPASMS  . triamcinolone ointment (KENALOG) 0.5 % APPLY EXTERNALLY TO THE AFFECTED AREA TWICE DAILY  . vitamin B-12 (CYANOCOBALAMIN) 500 MCG tablet Take 500 mcg by mouth daily.   No facility-administered encounter  medications on file as of 03/20/2020.  :  Review of Systems:  Out of a complete 14 point review of systems, all are reviewed and negative with the exception of these symptoms as listed below:  Virtual Visit via Telephone Note on 03/20/20:   I connected with@ on 03/20/20 at 12:30 PM EST by telephone and verified that I am speaking with the correct person using two identifiers.   I discussed the limitations, risks, security and privacy concerns of performing an evaluation and management service by telephone and the availability of in person appointments. I also discussed with the patient that there may be a patient responsible charge related to this service. The patient expressed understanding and agreed to proceed.   History of Present Illness: See above.   Observations/Objective: Pleasant conversant, no acute distress, normal breathing, no pressured speech, no dysarthria noted, good comprehension and good verbal feedback on instructions.  Assessment and Plan:  In summary, MARITSSA HAUGHTON is a 30 year old female with an underlying medical history of anemia, chronic back pain, anxiety and scoliosis, who presents for a phone call follow-up visit for her recurrent headaches of several years duration, likely tension type headaches as her history and description of headache is not telltale for migraines.  She has also had neck pain which can cause cervicogenic headaches.  She has had some modest results with amitriptyline 25 mg strength.  She has been taking the amitriptyline up to twice daily and has not always been fully consistent with taking it daily.  She is encouraged to take it only at night as it can cause daytime grogginess and balance issues and sleepiness.  She is advised to take 25 mg at bedtime and continue with Tylenol sparingly, only if needed.  Since she has had dizziness and reports a fall at night after she got up and became dizzy, she is advised to make an appointment in person  with her primary care to make sure she does not have any significant blood pressure drops or low blood pressure values, and she is encouraged to make sure she has  had pertinent blood work to rule out any medical causes of her dizziness including anemia, iron deficiency, thyroid dysfunction, vitamin D deficiency.  She is advised to follow-up in this clinic in person to see one of our nurse practitioners in 3 months.  I renewed the prescription for amitriptyline 25 mg nightly for 90 days with 1 refill.  She is advised to stay well-hydrated, and change positions slowly.   I answered all her questions today and she was in agreement. Follow Up Instructions:    I discussed the assessment and treatment plan with the patient. The patient was provided an opportunity to ask questions and all were answered. The patient agreed with the plan and demonstrated an understanding of the instructions.   The patient was advised to call back or seek an in-person evaluation if the symptoms worsen or if the condition fails to improve as anticipated.  I provided 15 minutes of non-face-to-face time during this encounter.   Star Age, MD

## 2020-03-20 NOTE — Patient Instructions (Addendum)
Verbal instructions given for phone call visit: We will continue with amitriptyline 25 mg nightly, I changed the prescription to 90 days with refills.  She is encouraged to make a follow-up appointment for a in person visit with [place] in 3 months to see the nurse practitioner but also make a follow-up appointment to see her primary care for her dizziness.  She may need her blood pressure checked, may need evaluation for anemia, thyroid disease, iron deficiency, vitamin D deficiency etc.  She is agreeable to making a follow-up appointment with her primary care.

## 2020-03-29 ENCOUNTER — Other Ambulatory Visit: Payer: Self-pay | Admitting: Nurse Practitioner

## 2020-03-29 DIAGNOSIS — G8929 Other chronic pain: Secondary | ICD-10-CM

## 2020-03-29 NOTE — Telephone Encounter (Signed)
   Requested medications are on the active medication list yes  Last refill 1/16  Notes to clinic Not Delegated

## 2020-04-30 DIAGNOSIS — F411 Generalized anxiety disorder: Secondary | ICD-10-CM | POA: Diagnosis not present

## 2020-05-03 ENCOUNTER — Other Ambulatory Visit: Payer: Self-pay | Admitting: Nurse Practitioner

## 2020-05-03 DIAGNOSIS — R109 Unspecified abdominal pain: Secondary | ICD-10-CM

## 2020-05-03 NOTE — Telephone Encounter (Signed)
MyChart message sent to pt to call and schedule appointment for refill. Routed request and refusal to office.

## 2020-05-30 ENCOUNTER — Other Ambulatory Visit: Payer: Self-pay | Admitting: Nurse Practitioner

## 2020-05-30 DIAGNOSIS — G8929 Other chronic pain: Secondary | ICD-10-CM

## 2020-05-30 NOTE — Telephone Encounter (Signed)
Requested medications are due for refill today yes  Requested medications are on the active medication list yes  Last refill 05/03/20  Last visit 01/2019  Future visit scheduled no  Notes to clinic This was ordered after phone call, unsure if to be continued.

## 2020-06-18 ENCOUNTER — Other Ambulatory Visit: Payer: Self-pay | Admitting: Nurse Practitioner

## 2020-06-18 NOTE — Telephone Encounter (Signed)
Requested medications are due for refill today.  yes  Requested medications are on the active medications list.  yes  Last refill. 06/16/2019  Future visit scheduled.   no  Notes to clinic.  Rx has expired.

## 2020-06-18 NOTE — Telephone Encounter (Signed)
Requested medications are due for refill today.  yes  Requested medications are on the active medications list.  yes  Last refill. 06/16/2019  Future visit scheduled.   no  Notes to clinic.  Expired Rx.

## 2020-06-25 ENCOUNTER — Encounter: Payer: Self-pay | Admitting: Nurse Practitioner

## 2020-06-26 DIAGNOSIS — F411 Generalized anxiety disorder: Secondary | ICD-10-CM | POA: Diagnosis not present

## 2020-08-13 ENCOUNTER — Ambulatory Visit: Payer: BC Managed Care – PPO | Attending: Nurse Practitioner | Admitting: Physician Assistant

## 2020-08-13 ENCOUNTER — Other Ambulatory Visit: Payer: Self-pay

## 2020-08-13 ENCOUNTER — Ambulatory Visit: Payer: BC Managed Care – PPO | Admitting: Physician Assistant

## 2020-08-13 ENCOUNTER — Encounter: Payer: Self-pay | Admitting: Physician Assistant

## 2020-08-13 DIAGNOSIS — R195 Other fecal abnormalities: Secondary | ICD-10-CM | POA: Diagnosis not present

## 2020-08-13 DIAGNOSIS — Z862 Personal history of diseases of the blood and blood-forming organs and certain disorders involving the immune mechanism: Secondary | ICD-10-CM

## 2020-08-13 DIAGNOSIS — R109 Unspecified abdominal pain: Secondary | ICD-10-CM

## 2020-08-13 DIAGNOSIS — N926 Irregular menstruation, unspecified: Secondary | ICD-10-CM

## 2020-08-13 MED ORDER — DICYCLOMINE HCL 10 MG PO CAPS: 10.0000 mg | ORAL_CAPSULE | Freq: Three times a day (TID) | ORAL | 1 refills | Status: DC

## 2020-08-13 NOTE — Progress Notes (Signed)
Virtual Visit via Video Note  I connected with Stephanie Young on 08/13/20 at  3:50 PM EDT by a video enabled telemedicine application and verified that I am speaking with the correct person using two identifiers.  Location: Patient: parked in her car in her work parking lot Provider: [facility] office   I discussed the limitations of evaluation and management by telemedicine and the availability of in person appointments. The patient expressed understanding and agreed to proceed.  History of Present Illness:  Patient is c/o continued abdominal cramping and alternating constipation and loose stools.  This has been going on intermittently for years but seems worse over the last couple of months.  No fever.  No blood in stool.  No N/V.  Dicyclomine helps with the cramping and loose stools. She has seen GI in the past but would like to see them again if this does not improve.     Also c/o irregular periods.  She has a h/o anemia and wants this to be checked as well.  Her mom has arthritis and she would like to have inflammatory markers checked.    She is not currently late for her period but would like to do a pregnancy test today.  She has a h/o h.pylori and also wants to be rechecked for this. She last had it about 5 years ago.      Observations/Objective: NAD.  A&Ox3.  Has on make-up;  Upbeat affect.    Assessment and Plan: 1. Abdominal cramping - Comprehensive metabolic panel - TSH - Sedimentation Rate - Vitamin D, 25-hydroxy - H. pylori breath test - dicyclomine (BENTYL) 10 MG capsule; Take 1 capsule (10 mg total) by mouth 3 (three) times daily before meals.  Dispense: 90 capsule; Refill: 1 - Ambulatory referral to Gastroenterology  2. Irregular periods - TSH - POCT urine pregnancy - CBC with Differential/Platelet; Future  3. History of anemia -CBC  4. Loose bowel movement - Sedimentation Rate - H. pylori breath test - Ambulatory referral to Gastroenterology - dicyclomine  (BENTYL) 10 MG capsule; Take 1 capsule (10 mg total) by mouth 3 (three) times daily before meals.  Dispense: 90 capsule; Refill: 1  Follow Up Instructions: See PCP in 4-6 months   I discussed the assessment and treatment plan with the patient. The patient was provided an opportunity to ask questions and all were answered. The patient agreed with the plan and demonstrated an understanding of the instructions.   The patient was advised to call back or seek an in-person evaluation if the symptoms worsen or if the condition fails to improve as anticipated.  I provided 14 minutes of non-face-to-face time during this encounter.   Georgian Co, PA-C  Patient ID: Stephanie Young, female   DOB: [birth year], 30 y.o.   MRN: [id number]

## 2020-08-14 ENCOUNTER — Encounter: Payer: Self-pay | Admitting: Physician Assistant

## 2020-08-14 LAB — COMPREHENSIVE METABOLIC PANEL
ALT: 17 IU/L (ref 0–32)
AST: 16 IU/L (ref 0–40)
Albumin/Globulin Ratio: 1.5 (ref 1.2–2.2)
Albumin: 4.7 g/dL (ref 3.9–5.0)
Alkaline Phosphatase: 58 IU/L (ref 44–121)
BUN/Creatinine Ratio: 9 (ref 9–23)
BUN: 7 mg/dL (ref 6–20)
Bilirubin Total: 1 mg/dL (ref 0.0–1.2)
CO2: 22 mmol/L (ref 20–29)
Calcium: 9.6 mg/dL (ref 8.7–10.2)
Chloride: 97 mmol/L (ref 96–106)
Creatinine, Ser: 0.8 mg/dL (ref 0.57–1.00)
Globulin, Total: 3.1 g/dL (ref 1.5–4.5)
Glucose: 126 mg/dL — ABNORMAL HIGH (ref 65–99)
Potassium: 3.7 mmol/L (ref 3.5–5.2)
Sodium: 138 mmol/L (ref 134–144)
Total Protein: 7.8 g/dL (ref 6.0–8.5)
eGFR: 102 mL/min/{1.73_m2} (ref >59)

## 2020-08-14 LAB — VITAMIN D 25 HYDROXY (VIT D DEFICIENCY, FRACTURES): Vit D, 25-Hydroxy: 22.6 ng/mL — ABNORMAL LOW (ref 30.0–100.0)

## 2020-08-14 LAB — TSH: TSH: 0.425 u[IU]/mL — ABNORMAL LOW (ref 0.450–4.500)

## 2020-08-14 LAB — SEDIMENTATION RATE: Sed Rate: 13 mm/hr (ref 0–32)

## 2020-08-15 LAB — H. PYLORI BREATH TEST: H pylori Breath Test: NEGATIVE

## 2020-08-19 ENCOUNTER — Encounter: Payer: Self-pay | Admitting: Nurse Practitioner

## 2020-08-19 ENCOUNTER — Other Ambulatory Visit: Payer: Self-pay | Admitting: Nurse Practitioner

## 2020-08-19 DIAGNOSIS — R7989 Other specified abnormal findings of blood chemistry: Secondary | ICD-10-CM

## 2020-08-19 DIAGNOSIS — R7309 Other abnormal glucose: Secondary | ICD-10-CM

## 2020-08-19 DIAGNOSIS — Z862 Personal history of diseases of the blood and blood-forming organs and certain disorders involving the immune mechanism: Secondary | ICD-10-CM

## 2020-08-20 ENCOUNTER — Other Ambulatory Visit: Payer: Self-pay | Admitting: Physician Assistant

## 2020-08-20 DIAGNOSIS — N926 Irregular menstruation, unspecified: Secondary | ICD-10-CM

## 2020-08-20 DIAGNOSIS — R7989 Other specified abnormal findings of blood chemistry: Secondary | ICD-10-CM

## 2020-08-20 MED ORDER — VITAMIN D (ERGOCALCIFEROL) 1.25 MG (50000 UNIT) PO CAPS: 50000.0000 [IU] | ORAL_CAPSULE | ORAL | 0 refills | Status: DC

## 2020-08-28 ENCOUNTER — Other Ambulatory Visit: Payer: Self-pay | Admitting: Nurse Practitioner

## 2020-08-28 DIAGNOSIS — G8929 Other chronic pain: Secondary | ICD-10-CM

## 2020-08-28 NOTE — Telephone Encounter (Signed)
Requested medications are due for refill today yes  Requested medications are on the active medication list yes  Last refill 05/22/20 for Zyrtec, 03/23/20 for Zanaflex  Last visit 03/2020  Future visit scheduled no  Notes to clinic Do not see Zyrtec/allergies noted in OV and Zanaflex Not Delegated.

## 2020-10-07 ENCOUNTER — Encounter: Payer: Self-pay | Admitting: Nurse Practitioner

## 2020-10-08 NOTE — Telephone Encounter (Signed)
Can someone get her added to the schedule today for me please. Thanks.

## 2020-10-21 ENCOUNTER — Other Ambulatory Visit: Payer: Self-pay

## 2020-10-21 ENCOUNTER — Encounter: Payer: Self-pay | Admitting: Nurse Practitioner

## 2020-10-21 ENCOUNTER — Ambulatory Visit: Payer: Self-pay | Attending: Nurse Practitioner | Admitting: Nurse Practitioner

## 2020-10-21 DIAGNOSIS — N946 Dysmenorrhea, unspecified: Secondary | ICD-10-CM

## 2020-10-21 DIAGNOSIS — M5441 Lumbago with sciatica, right side: Secondary | ICD-10-CM

## 2020-10-21 DIAGNOSIS — G8929 Other chronic pain: Secondary | ICD-10-CM

## 2020-10-21 DIAGNOSIS — R220 Localized swelling, mass and lump, head: Secondary | ICD-10-CM

## 2020-10-21 MED ORDER — TIZANIDINE HCL 4 MG PO TABS
4.0000 mg | ORAL_TABLET | Freq: Three times a day (TID) | ORAL | 2 refills | Status: DC | PRN
  Filled 2020-10-21: qty 30, 10d supply, fill #0
  Filled 2020-11-26 – 2020-11-27 (×2): qty 30, 10d supply, fill #1
  Filled 2021-05-16 – 2021-05-22 (×4): qty 30, 10d supply, fill #0

## 2020-10-21 MED ORDER — IBUPROFEN 800 MG PO TABS
800.0000 mg | ORAL_TABLET | Freq: Three times a day (TID) | ORAL | 2 refills | Status: DC | PRN
  Filled 2020-10-21: qty 60, 20d supply, fill #0
  Filled 2020-11-26 – 2020-11-27 (×2): qty 60, 20d supply, fill #1
  Filled 2021-01-15: qty 60, 20d supply, fill #2

## 2020-10-21 NOTE — Progress Notes (Signed)
Virtual Visit via Telephone Note Due to national recommendations of social distancing due to COVID 19, telehealth visit is felt to be most appropriate for this patient at this time.  I discussed the limitations, risks, security and privacy concerns of performing an evaluation and management service by telephone and the availability of in person appointments. I also discussed with the patient that there may be a patient responsible charge related to this service. The patient expressed understanding and agreed to proceed.    I connected with Stephanie Young on 10/21/20  at   8:10 AM EDT  EDT by telephone and verified that I am speaking with the correct person using two identifiers.  Location of Patient: Private Residence   Location of Provider: [facility]    Persons participating in Telemedicine visit: Bertram Denver FNP-BC Stephanie Young    History of Present Illness: Telemedicine visit for: Tooth pain and mandibular mass  She has a mass in the left mandibular region that she states is increasing in size. The mass is non tender to palpation. She had an xray of the area which did not show any abnormalities. She also has a premolar on the left side which is very sensitive to touch and pressure. States she was told that she needed a root canal.   GU She takes ibuprofen as needed for menstrual cramps. Helps to relieve her pain and discomfort.   Past Medical History:  Diagnosis Date   Anemia    H. pylori infection    History of kidney stones     Past Surgical History:  Procedure Laterality Date   dental procedure     FLEXIBLE SIGMOIDOSCOPY N/A 01/30/2018   Procedure: FLEXIBLE SIGMOIDOSCOPY WITH PROPOFOL;  Surgeon: Corbin Ade, MD;  Location[facility];  Service: Endoscopy;  Laterality: N/A;  1:45pm    Family History  Problem Relation Age of Onset   Breast cancer Mother 33   Hypertension Mother    Hypertension Brother    Diabetes  Maternal Uncle    Diabetes Other    Colon cancer Neg Hx    Colon polyps Neg Hx     Social History   Socioeconomic History   Marital status: Married    Spouse name: Not on file   Number of children: Not on file   Years of education: Not on file   Highest education level: Not on file  Occupational History   Not on file  Tobacco Use   Smoking status: Former    Packs/day: 0.25    Years: 9.00    Pack years: 2.25    Types: Cigarettes    Quit date: 10/25/2017    Years since quitting: 2.9   Smokeless tobacco: Never  Vaping Use   Vaping Use: Never used  Substance and Sexual Activity   Alcohol use: Yes    Comment: socially    Drug use: Not Currently    Types: Marijuana    Comment: none since 10/2017   Sexual activity: Yes    Partners: Male    Birth control/protection: None  Other Topics Concern   Not on file  Social History Narrative   Not on file   Social Determinants of Health   Financial Resource Strain: Not on file  Food Insecurity: Not on file  Transportation Needs: Not on file  Physical Activity: Not on file  Stress: Not on file  Social Connections: Not on file     Observations/Objective: Awake, alert and oriented  x 3   Review of Systems  Constitutional:  Negative for fever, malaise/fatigue and weight loss.  HENT: Negative.  Negative for nosebleeds.        SEE HPI  Eyes: Negative.  Negative for blurred vision, double vision and photophobia.  Respiratory: Negative.  Negative for cough and shortness of breath.   Cardiovascular: Negative.  Negative for chest pain, palpitations and leg swelling.  Gastrointestinal: Negative.  Negative for heartburn, nausea and vomiting.  Genitourinary:        SEE HPI  Musculoskeletal:  Positive for back pain. Negative for myalgias.  Skin:        SEE HPI  Neurological: Negative.  Negative for dizziness, focal weakness, seizures and headaches.  Psychiatric/Behavioral: Negative.  Negative for suicidal ideas.    Assessment and  Plan: Diagnoses and all orders for this visit:  Chronic right-sided low back pain with right-sided sciatica -     tiZANidine (ZANAFLEX) 4 MG tablet; Take 1 tablet (4 mg total) by mouth every 8 (eight) hours as needed for muscle spasms. Pain is well controlled.   Menstrual cramps -     ibuprofen (ADVIL) 800 MG tablet; Take 1 tablet (800 mg total) by mouth every 8 (eight) hours as needed.  Mass of mandible -     US Soft Tissue Head/Neck (NON-THYROID); Future    Follow Up Instructions Return if symptoms worsen or fail to improve.     I discussed the assessment and treatment plan with the patient. The patient was provided an opportunity to ask questions and all were answered. The patient agreed with the plan and demonstrated an understanding of the instructions.   The patient was advised to call back or seek an in-person evaluation if the symptoms worsen or if the condition fails to improve as anticipated.  I provided 12 minutes of non-face-to-face time during this encounter including median intraservice time, reviewing previous notes, labs, imaging, medications and explaining diagnosis and management.  Claiborne Rigg, FNP-BC

## 2020-10-22 ENCOUNTER — Other Ambulatory Visit: Payer: Self-pay

## 2020-11-05 ENCOUNTER — Other Ambulatory Visit: Payer: Self-pay

## 2020-11-09 ENCOUNTER — Encounter: Payer: Self-pay | Admitting: Nurse Practitioner

## 2020-11-11 NOTE — Telephone Encounter (Signed)
Alycia can you assist [place] with ordering this ultrasound. Thanks!!

## 2020-11-12 ENCOUNTER — Ambulatory Visit: Payer: Self-pay | Attending: Nurse Practitioner

## 2020-11-12 ENCOUNTER — Other Ambulatory Visit: Payer: Self-pay

## 2020-11-12 DIAGNOSIS — R220 Localized swelling, mass and lump, head: Secondary | ICD-10-CM

## 2020-11-12 DIAGNOSIS — Z862 Personal history of diseases of the blood and blood-forming organs and certain disorders involving the immune mechanism: Secondary | ICD-10-CM

## 2020-11-12 DIAGNOSIS — R7309 Other abnormal glucose: Secondary | ICD-10-CM

## 2020-11-13 LAB — CBC
Hematocrit: 36 % (ref 34.0–46.6)
Hemoglobin: 11.2 g/dL (ref 11.1–15.9)
MCH: 25.2 pg — ABNORMAL LOW (ref 26.6–33.0)
MCHC: 31.1 g/dL — ABNORMAL LOW (ref 31.5–35.7)
MCV: 81 fL (ref 79–97)
Platelets: 474 10*3/uL — ABNORMAL HIGH (ref 150–450)
RBC: 4.44 x10E6/uL (ref 3.77–5.28)
RDW: 14.3 % (ref 11.7–15.4)
WBC: 6.2 10*3/uL (ref 3.4–10.8)

## 2020-11-13 LAB — HEMOGLOBIN A1C
Est. average glucose Bld gHb Est-mCnc: 117 mg/dL
Hgb A1c MFr Bld: 5.7 % — ABNORMAL HIGH (ref 4.8–5.6)

## 2020-11-18 ENCOUNTER — Ambulatory Visit (HOSPITAL_COMMUNITY): Payer: Self-pay

## 2020-11-26 ENCOUNTER — Ambulatory Visit (HOSPITAL_COMMUNITY)
Admission: RE | Admit: 2020-11-26 | Discharge: 2020-11-26 | Disposition: A | Discharge status: Home / Self Care | Payer: Self-pay | Source: Ambulatory Visit | Attending: Nurse Practitioner | Admitting: Nurse Practitioner

## 2020-11-26 ENCOUNTER — Other Ambulatory Visit: Payer: Self-pay

## 2020-11-26 DIAGNOSIS — R220 Localized swelling, mass and lump, head: Secondary | ICD-10-CM | POA: Insufficient documentation

## 2020-11-27 ENCOUNTER — Other Ambulatory Visit: Payer: Self-pay

## 2020-12-03 ENCOUNTER — Ambulatory Visit (HOSPITAL_COMMUNITY): Payer: Self-pay

## 2020-12-08 ENCOUNTER — Other Ambulatory Visit: Payer: Self-pay | Admitting: Physician Assistant

## 2020-12-08 ENCOUNTER — Other Ambulatory Visit: Payer: Self-pay

## 2020-12-08 MED ORDER — MIRTAZAPINE 30 MG PO TABS
ORAL_TABLET | ORAL | 1 refills | Status: DC
  Filled 2020-12-08: qty 30, 30d supply, fill #0
  Filled 2021-01-19: qty 30, 30d supply, fill #1

## 2020-12-08 MED ORDER — BUSPIRONE HCL 5 MG PO TABS
ORAL_TABLET | ORAL | 1 refills | Status: DC
  Filled 2020-12-08: qty 60, 30d supply, fill #0
  Filled 2021-01-15: qty 60, 30d supply, fill #1

## 2020-12-08 MED ORDER — FLUOXETINE HCL 40 MG PO CAPS
ORAL_CAPSULE | ORAL | 1 refills | Status: DC
  Filled 2020-12-08: qty 30, 30d supply, fill #0
  Filled 2021-01-15: qty 30, 30d supply, fill #1

## 2020-12-08 NOTE — Telephone Encounter (Signed)
Requested medication (s) are due for refill today: Due 12/20/20  Requested medication (s) are on the active medication list: yes  Last refill: 08/20/20  #16  0 refills  Future visit scheduled no  Notes to clinic: not delegated  Requested Prescriptions  Pending Prescriptions Disp Refills   Vitamin D, Ergocalciferol, (DRISDOL) 1.25 MG (50000 UNIT) CAPS capsule [Pharmacy Med Name: VITAMIN D2 50,000IU (ERGO) CAP RX] 16 capsule 0    Sig: TAKE 1 CAPSULE BY MOUTH EVERY 7 DAYS     Endocrinology:  Vitamins - Vitamin D Supplementation Failed - 12/08/2020  3:56 AM      Failed - 50,000 IU strengths are not delegated      Failed - Phosphate in normal range and within 360 days    Phosphorus  Date Value Ref Range Status  10/29/2017 3.8 2.5 - 4.6 mg/dL Final  64/33/2951 3.7 2.5 - 4.6 mg/dL Final    Comment:    Performed at [facility], [address], [place], [place] [address]          Failed - Vitamin D in normal range and within 360 days    Vit D, 25-Hydroxy  Date Value Ref Range Status  08/13/2020 22.6 (L) 30.0 - 100.0 ng/mL Final    Comment:    Vitamin D deficiency has been defined by the Institute of Medicine and an Endocrine Society practice guideline as a level of serum 25-OH vitamin D less than 20 ng/mL (1,2). The Endocrine Society went on to further define vitamin D insufficiency as a level between 21 and 29 ng/mL (2). 1. IOM (Institute of Medicine). 2010. Dietary reference    intakes for calcium and D. [place]: The    [employer]. 2. Holick MF, Binkley Austin, Bischoff-Ferrari HA, et al.    Evaluation, treatment, and prevention of vitamin D    deficiency: an Endocrine Society clinical practice    guideline. JCEM. 2011 Jul; 96(7):1911-30.           Passed - Ca in normal range and within 360 days    Calcium  Date Value Ref Range Status  08/13/2020 9.6 8.7 - 10.2 mg/dL Final          Passed - Valid encounter within last 12 months    Recent  Outpatient Visits           1 month ago Chronic right-sided low back pain with right-sided sciatica   [facility] [place], Shea Stakes, NP   3 months ago Abdominal cramping   [facility] [place], [place], [place]   1 year ago Chronic right-sided low back pain with right-sided sciatica   [facility] [place], [place] W, NP   2 years ago Chronic midline low back pain with right-sided sciatica   [facility] [place], Shea Stakes, NP   3 years ago Multifocal pneumonia   [facility] [place], Shea Stakes, NP

## 2020-12-09 ENCOUNTER — Encounter: Payer: Self-pay | Admitting: Nurse Practitioner

## 2020-12-10 ENCOUNTER — Other Ambulatory Visit: Payer: Self-pay

## 2020-12-10 ENCOUNTER — Other Ambulatory Visit: Payer: Self-pay | Admitting: Nurse Practitioner

## 2020-12-10 MED ORDER — PROMETHAZINE-DM 6.25-15 MG/5ML PO SYRP
5.0000 mL | ORAL_SOLUTION | Freq: Four times a day (QID) | ORAL | 0 refills | Status: DC | PRN
  Filled 2020-12-10: qty 240, 12d supply, fill #0

## 2020-12-10 MED ORDER — FLUTICASONE PROPIONATE 50 MCG/ACT NA SUSP
1.0000 | Freq: Every day | NASAL | 1 refills | Status: DC
  Filled 2020-12-10: qty 16, 30d supply, fill #0
  Filled 2021-08-26: qty 16, 60d supply, fill #0

## 2020-12-12 ENCOUNTER — Other Ambulatory Visit: Payer: Self-pay

## 2020-12-25 ENCOUNTER — Other Ambulatory Visit: Payer: Self-pay

## 2021-01-15 ENCOUNTER — Other Ambulatory Visit: Payer: Self-pay

## 2021-01-19 ENCOUNTER — Other Ambulatory Visit: Payer: Self-pay

## 2021-01-29 ENCOUNTER — Other Ambulatory Visit: Payer: Self-pay | Admitting: Physician Assistant

## 2021-01-29 ENCOUNTER — Encounter: Payer: Self-pay | Admitting: Nurse Practitioner

## 2021-02-02 ENCOUNTER — Other Ambulatory Visit: Payer: Self-pay | Admitting: Nurse Practitioner

## 2021-02-02 ENCOUNTER — Other Ambulatory Visit: Payer: Self-pay

## 2021-02-02 DIAGNOSIS — R109 Unspecified abdominal pain: Secondary | ICD-10-CM

## 2021-02-02 DIAGNOSIS — Z8709 Personal history of other diseases of the respiratory system: Secondary | ICD-10-CM

## 2021-02-02 MED ORDER — DICYCLOMINE HCL 10 MG PO CAPS
10.0000 mg | ORAL_CAPSULE | Freq: Three times a day (TID) | ORAL | 1 refills | Status: DC
  Filled 2021-02-02 – 2021-08-25 (×5): qty 90, 30d supply, fill #0

## 2021-02-02 MED ORDER — ALBUTEROL SULFATE HFA 108 (90 BASE) MCG/ACT IN AERS
2.0000 | INHALATION_SPRAY | Freq: Four times a day (QID) | RESPIRATORY_TRACT | 1 refills | Status: DC | PRN
Start: 2021-02-02 — End: 2021-09-15
  Filled 2021-02-02 – 2021-08-26 (×2): qty 18, 25d supply, fill #0

## 2021-02-05 ENCOUNTER — Other Ambulatory Visit: Payer: Self-pay | Admitting: Nurse Practitioner

## 2021-02-05 ENCOUNTER — Encounter: Payer: Self-pay | Admitting: Nurse Practitioner

## 2021-02-05 ENCOUNTER — Other Ambulatory Visit: Payer: Self-pay

## 2021-02-05 MED ORDER — METRONIDAZOLE 0.75 % VA GEL
1.0000 | Freq: Two times a day (BID) | VAGINAL | 0 refills | Status: AC
  Filled 2021-02-05: qty 70, 5d supply, fill #0

## 2021-02-05 MED ORDER — METRONIDAZOLE 0.75 % VA GEL: 1.0000 | Freq: Two times a day (BID) | VAGINAL | 0 refills | Status: DC

## 2021-02-06 ENCOUNTER — Other Ambulatory Visit: Payer: Self-pay

## 2021-02-07 ENCOUNTER — Encounter: Payer: Self-pay | Admitting: Nurse Practitioner

## 2021-02-09 ENCOUNTER — Other Ambulatory Visit: Payer: Self-pay | Admitting: Nurse Practitioner

## 2021-02-09 ENCOUNTER — Other Ambulatory Visit: Payer: Self-pay

## 2021-02-09 DIAGNOSIS — L219 Seborrheic dermatitis, unspecified: Secondary | ICD-10-CM

## 2021-02-09 MED ORDER — KETOCONAZOLE 2 % EX SHAM
1.0000 | MEDICATED_SHAMPOO | CUTANEOUS | 1 refills | Status: AC
Start: 2021-02-09 — End: ?
  Filled 2021-02-09: qty 120, 84d supply, fill #0
  Filled 2021-02-23: qty 120, 30d supply, fill #0

## 2021-02-11 ENCOUNTER — Other Ambulatory Visit: Payer: Self-pay

## 2021-02-11 MED ORDER — BUSPIRONE HCL 5 MG PO TABS
ORAL_TABLET | ORAL | 1 refills | Status: DC
  Filled 2021-02-11 – 2021-03-31 (×2): qty 60, 30d supply, fill #0

## 2021-02-11 MED ORDER — MIRTAZAPINE 30 MG PO TABS: ORAL_TABLET | ORAL | 1 refills | Status: DC

## 2021-02-11 MED ORDER — FLUOXETINE HCL 40 MG PO CAPS
ORAL_CAPSULE | ORAL | 1 refills | Status: DC
  Filled 2021-02-11 – 2021-03-31 (×2): qty 30, 30d supply, fill #0

## 2021-02-12 ENCOUNTER — Other Ambulatory Visit: Payer: Self-pay

## 2021-02-16 ENCOUNTER — Other Ambulatory Visit: Payer: Self-pay

## 2021-02-19 ENCOUNTER — Encounter: Payer: Self-pay | Admitting: Nurse Practitioner

## 2021-02-20 ENCOUNTER — Other Ambulatory Visit: Payer: Self-pay

## 2021-02-20 ENCOUNTER — Other Ambulatory Visit: Payer: Self-pay | Admitting: Nurse Practitioner

## 2021-02-20 DIAGNOSIS — K047 Periapical abscess without sinus: Secondary | ICD-10-CM

## 2021-02-20 MED ORDER — CLINDAMYCIN HCL 300 MG PO CAPS
300.0000 mg | ORAL_CAPSULE | Freq: Three times a day (TID) | ORAL | 0 refills | Status: AC
  Filled 2021-02-20: qty 15, 5d supply, fill #0

## 2021-02-23 ENCOUNTER — Other Ambulatory Visit: Payer: Self-pay

## 2021-02-25 ENCOUNTER — Other Ambulatory Visit: Payer: Self-pay

## 2021-03-10 ENCOUNTER — Encounter: Payer: Self-pay | Admitting: Nurse Practitioner

## 2021-03-12 ENCOUNTER — Telehealth: Payer: Self-pay | Admitting: Nurse Practitioner

## 2021-03-12 NOTE — Telephone Encounter (Signed)
Copied from [place] [id number]. Topic: General - Other >> Mar 11, 2021 12:21 PM Fields, Museum/gallery conservator R wrote: Reason for CRM: pt calling in saying pcp told her to come in for labs, I se no orders I her chart

## 2021-03-12 NOTE — Telephone Encounter (Signed)
Lab appointment made.

## 2021-03-19 ENCOUNTER — Other Ambulatory Visit: Payer: Self-pay

## 2021-03-24 ENCOUNTER — Other Ambulatory Visit: Payer: Self-pay

## 2021-03-31 ENCOUNTER — Other Ambulatory Visit: Payer: Self-pay

## 2021-03-31 MED ORDER — MIRTAZAPINE 30 MG PO TABS
30.0000 mg | ORAL_TABLET | Freq: Every evening | ORAL | 1 refills | Status: DC | PRN
  Filled 2021-03-31: qty 30, 30d supply, fill #0
  Filled 2021-08-25: qty 30, 30d supply, fill #1

## 2021-04-21 ENCOUNTER — Encounter: Payer: Self-pay | Admitting: Nurse Practitioner

## 2021-04-28 ENCOUNTER — Other Ambulatory Visit: Payer: Self-pay

## 2021-04-28 MED ORDER — BUSPIRONE HCL 5 MG PO TABS
ORAL_TABLET | ORAL | 1 refills | Status: DC
Start: 2021-04-28 — End: 2021-07-29
  Filled 2021-04-28: qty 60, 30d supply, fill #0
  Filled 2021-06-22 – 2021-06-23 (×2): qty 60, 30d supply, fill #1

## 2021-04-28 MED ORDER — MIRTAZAPINE 30 MG PO TABS
ORAL_TABLET | ORAL | 1 refills | Status: DC
  Filled 2021-04-28: qty 30, 30d supply, fill #0
  Filled 2021-09-25: qty 30, 30d supply, fill #1

## 2021-04-28 MED ORDER — FLUOXETINE HCL 40 MG PO CAPS
ORAL_CAPSULE | ORAL | 1 refills | Status: DC
  Filled 2021-04-28: qty 30, 30d supply, fill #0
  Filled 2021-06-22 – 2021-06-23 (×2): qty 30, 30d supply, fill #1

## 2021-05-01 ENCOUNTER — Ambulatory Visit: Payer: Self-pay | Admitting: Nurse Practitioner

## 2021-05-13 ENCOUNTER — Other Ambulatory Visit: Payer: Self-pay

## 2021-05-17 ENCOUNTER — Other Ambulatory Visit (HOSPITAL_COMMUNITY): Payer: Self-pay

## 2021-05-18 ENCOUNTER — Other Ambulatory Visit (HOSPITAL_COMMUNITY): Payer: Self-pay

## 2021-05-18 ENCOUNTER — Other Ambulatory Visit: Payer: Self-pay

## 2021-05-19 ENCOUNTER — Other Ambulatory Visit: Payer: Self-pay

## 2021-05-20 ENCOUNTER — Ambulatory Visit: Payer: Self-pay | Admitting: Physician Assistant

## 2021-05-20 ENCOUNTER — Other Ambulatory Visit: Payer: Self-pay

## 2021-05-21 ENCOUNTER — Other Ambulatory Visit (HOSPITAL_COMMUNITY): Payer: Self-pay

## 2021-05-21 ENCOUNTER — Other Ambulatory Visit: Payer: Self-pay

## 2021-05-22 ENCOUNTER — Other Ambulatory Visit: Payer: Self-pay

## 2021-05-22 ENCOUNTER — Other Ambulatory Visit (HOSPITAL_COMMUNITY): Payer: Self-pay

## 2021-05-23 ENCOUNTER — Other Ambulatory Visit (HOSPITAL_COMMUNITY): Payer: Self-pay

## 2021-05-25 ENCOUNTER — Other Ambulatory Visit (HOSPITAL_COMMUNITY): Payer: Self-pay

## 2021-05-26 ENCOUNTER — Encounter: Payer: Self-pay | Admitting: Nurse Practitioner

## 2021-06-22 ENCOUNTER — Other Ambulatory Visit: Payer: Self-pay

## 2021-06-22 ENCOUNTER — Other Ambulatory Visit: Payer: Self-pay | Admitting: Nurse Practitioner

## 2021-06-22 DIAGNOSIS — G8929 Other chronic pain: Secondary | ICD-10-CM

## 2021-06-22 DIAGNOSIS — N946 Dysmenorrhea, unspecified: Secondary | ICD-10-CM

## 2021-06-22 MED ORDER — IBUPROFEN 800 MG PO TABS
800.0000 mg | ORAL_TABLET | Freq: Three times a day (TID) | ORAL | 0 refills | Status: DC | PRN
  Filled 2021-06-22 – 2021-06-23 (×2): qty 60, 20d supply, fill #0

## 2021-06-22 MED ORDER — TIZANIDINE HCL 4 MG PO TABS
4.0000 mg | ORAL_TABLET | Freq: Three times a day (TID) | ORAL | 0 refills | Status: DC | PRN
  Filled 2021-06-22 – 2021-06-23 (×2): qty 30, 10d supply, fill #0

## 2021-06-23 ENCOUNTER — Other Ambulatory Visit (HOSPITAL_COMMUNITY): Payer: Self-pay

## 2021-07-08 ENCOUNTER — Ambulatory Visit: Payer: Self-pay | Admitting: Physician Assistant

## 2021-07-29 ENCOUNTER — Other Ambulatory Visit (HOSPITAL_COMMUNITY): Payer: Self-pay

## 2021-07-29 MED ORDER — FLUOXETINE HCL 40 MG PO CAPS
ORAL_CAPSULE | ORAL | 1 refills | Status: DC
  Filled 2021-07-29: qty 30, 30d supply, fill #0
  Filled 2021-08-25: qty 30, 30d supply, fill #1

## 2021-07-29 MED ORDER — METHYLPHENIDATE HCL ER (OSM) 27 MG PO TBCR
EXTENDED_RELEASE_TABLET | ORAL | 0 refills | Status: DC
  Filled 2021-07-29 – 2021-09-25 (×2): qty 30, 30d supply, fill #0

## 2021-07-29 MED ORDER — BUSPIRONE HCL 5 MG PO TABS
ORAL_TABLET | ORAL | 1 refills | Status: DC
  Filled 2021-07-29: qty 60, 30d supply, fill #0
  Filled 2021-08-25: qty 60, 30d supply, fill #1

## 2021-07-29 MED ORDER — METHYLPHENIDATE HCL ER (OSM) 27 MG PO TBCR: EXTENDED_RELEASE_TABLET | ORAL | 0 refills | Status: DC

## 2021-07-29 MED ORDER — MIRTAZAPINE 30 MG PO TABS
ORAL_TABLET | ORAL | 1 refills | Status: DC
  Filled 2021-07-29: qty 30, 30d supply, fill #0

## 2021-08-07 ENCOUNTER — Other Ambulatory Visit (HOSPITAL_COMMUNITY): Payer: Self-pay

## 2021-08-25 ENCOUNTER — Other Ambulatory Visit: Payer: Self-pay | Admitting: Family Medicine

## 2021-08-25 ENCOUNTER — Other Ambulatory Visit: Payer: Self-pay | Admitting: Nurse Practitioner

## 2021-08-25 DIAGNOSIS — N946 Dysmenorrhea, unspecified: Secondary | ICD-10-CM

## 2021-08-26 ENCOUNTER — Other Ambulatory Visit (HOSPITAL_COMMUNITY): Payer: Self-pay

## 2021-08-26 ENCOUNTER — Other Ambulatory Visit: Payer: Self-pay | Admitting: Family Medicine

## 2021-08-26 DIAGNOSIS — N946 Dysmenorrhea, unspecified: Secondary | ICD-10-CM

## 2021-08-26 MED ORDER — TRIAMCINOLONE ACETONIDE 0.5 % EX OINT
TOPICAL_OINTMENT | Freq: Three times a day (TID) | CUTANEOUS | 1 refills | Status: DC
  Filled 2021-08-26: qty 60, 30d supply, fill #0
  Filled 2022-04-24: qty 60, 30d supply, fill #1

## 2021-08-27 ENCOUNTER — Other Ambulatory Visit (HOSPITAL_COMMUNITY): Payer: Self-pay

## 2021-09-15 ENCOUNTER — Other Ambulatory Visit (HOSPITAL_COMMUNITY): Payer: Self-pay

## 2021-09-15 MED ORDER — ALBUTEROL SULFATE HFA 108 (90 BASE) MCG/ACT IN AERS
INHALATION_SPRAY | RESPIRATORY_TRACT | 0 refills | Status: DC
  Filled 2021-09-15: qty 6.7, 25d supply, fill #0

## 2021-09-24 ENCOUNTER — Other Ambulatory Visit: Payer: Self-pay

## 2021-09-25 ENCOUNTER — Other Ambulatory Visit (HOSPITAL_COMMUNITY): Payer: Self-pay

## 2021-09-25 ENCOUNTER — Other Ambulatory Visit: Payer: Self-pay

## 2021-09-25 MED ORDER — MIRTAZAPINE 30 MG PO TABS
ORAL_TABLET | ORAL | 0 refills | Status: DC
  Filled 2021-09-25 – 2022-02-27 (×2): qty 30, 30d supply, fill #0

## 2021-09-25 MED ORDER — FLUOXETINE HCL 40 MG PO CAPS
ORAL_CAPSULE | ORAL | 0 refills | Status: DC
  Filled 2021-09-25: qty 30, 30d supply, fill #0

## 2021-09-25 MED ORDER — TRAZODONE HCL 50 MG PO TABS
ORAL_TABLET | ORAL | 0 refills | Status: DC
  Filled 2021-09-25: qty 15, 30d supply, fill #0

## 2021-09-25 MED ORDER — DYANAVEL XR 10 MG PO CHER
CHEWABLE_EXTENDED_RELEASE_TABLET | ORAL | 0 refills | Status: DC
  Filled 2021-09-25: qty 30, 30d supply, fill #0

## 2021-09-25 MED ORDER — BUSPIRONE HCL 10 MG PO TABS
ORAL_TABLET | ORAL | 0 refills | Status: DC
  Filled 2021-09-25: qty 60, 30d supply, fill #0

## 2021-09-28 ENCOUNTER — Other Ambulatory Visit (HOSPITAL_COMMUNITY): Payer: Self-pay

## 2021-10-12 ENCOUNTER — Encounter: Payer: Self-pay | Admitting: Nurse Practitioner

## 2021-11-21 IMAGING — US US SOFT TISSUE HEAD/NECK
1 series · 15 of 18 positions shown · non-contrast
Comparison: None.

CLINICAL DATA: Palpable left submandibular region nodule or lymph
node

EXAM:
ULTRASOUND OF HEAD/NECK SOFT TISSUES
TECHNIQUE: Ultrasound examination of the head and neck soft tissues was
performed in the area of clinical concern.

[Series 1: us soft tissue head/neck mc & wl · 18 acquisitions, 15 frames shown]
[im 1/18]
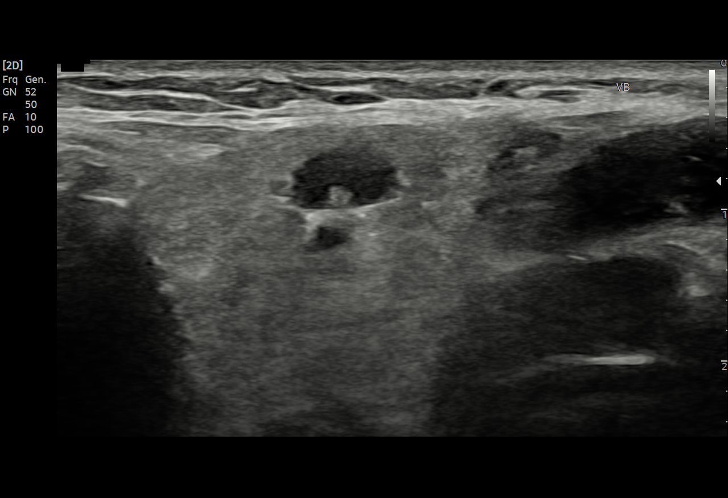
[im 2/18]
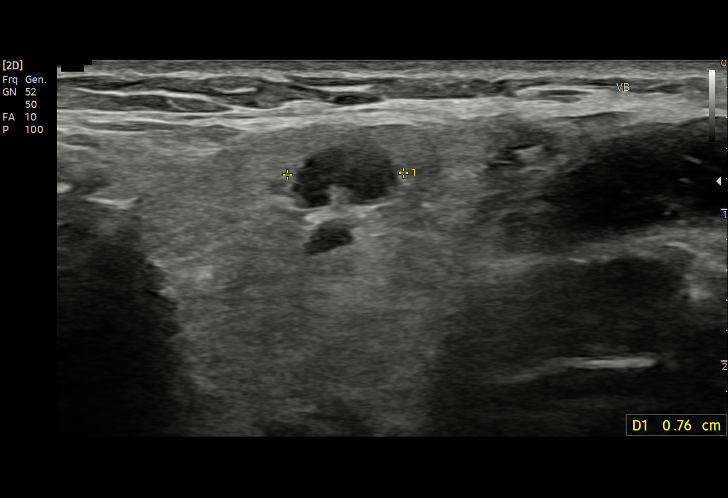
[im 4/18]
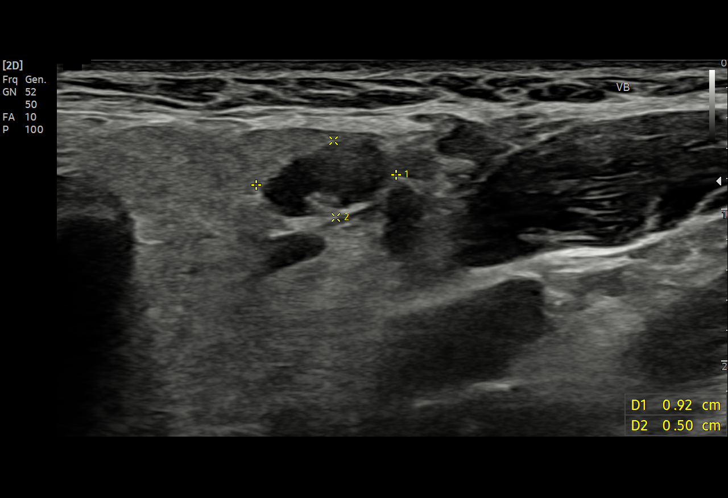
[im 5/18]
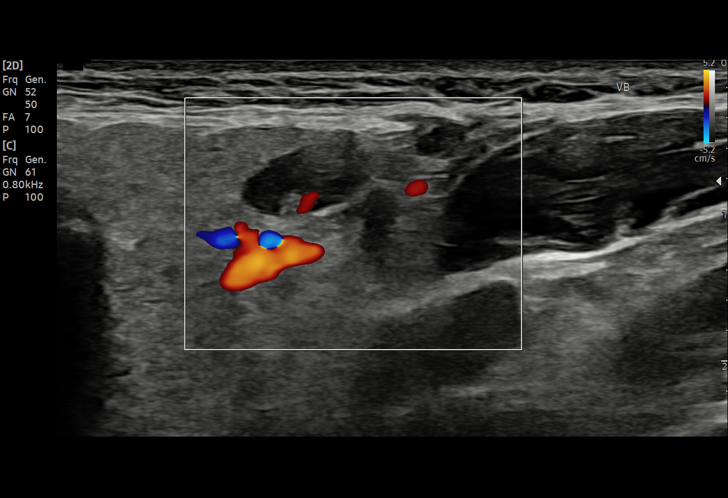
[im 6/18]
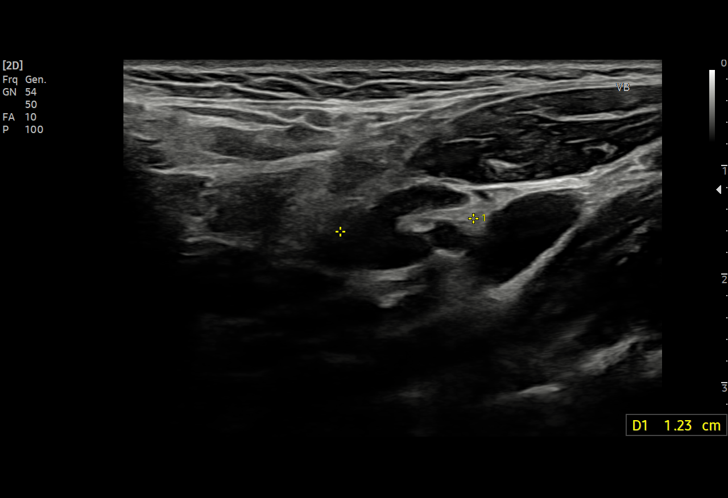
[im 7/18]
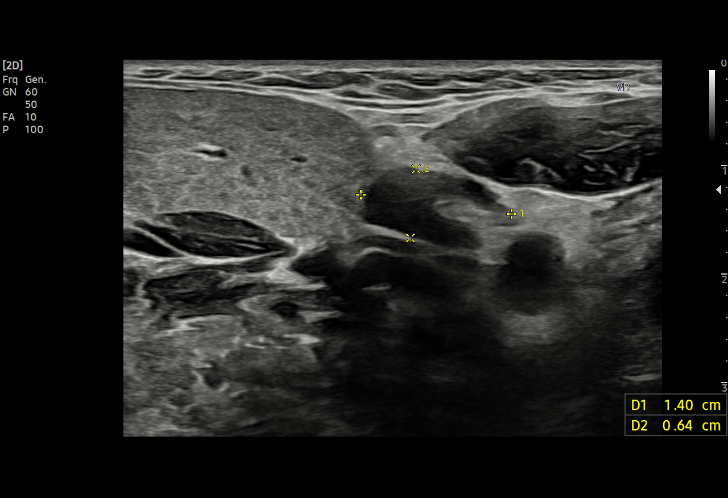
[im 8/18]
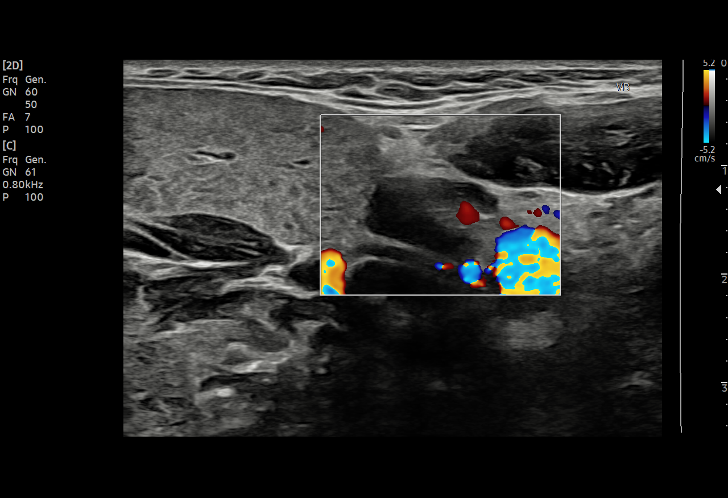
[im 10/18]
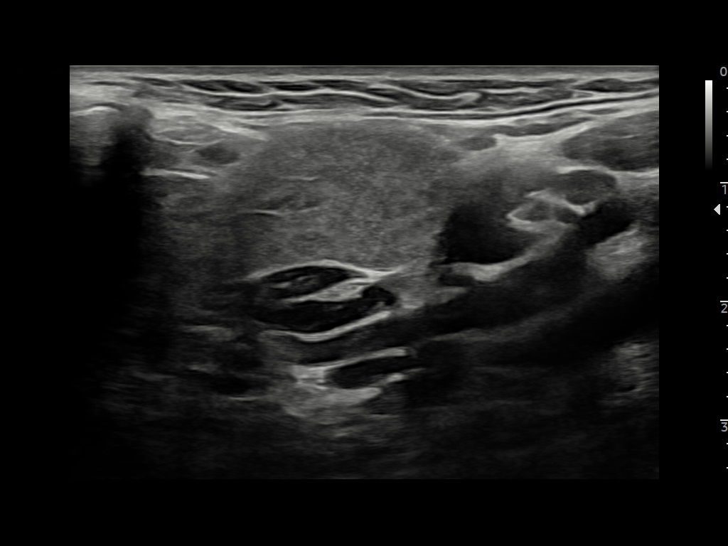
[im 11/18]
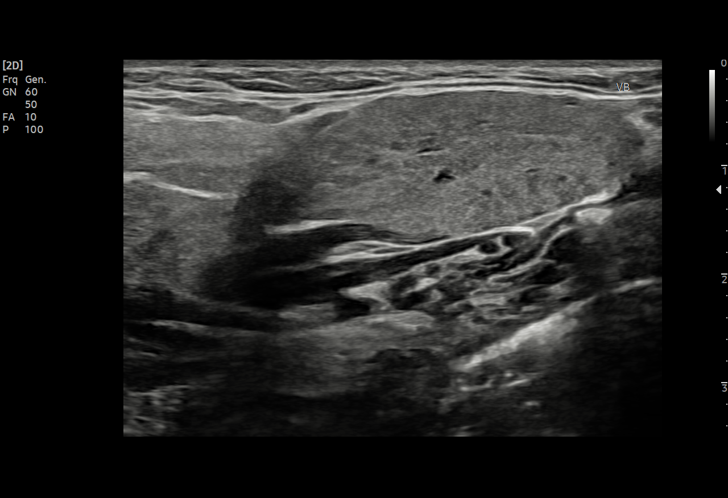
[im 12/18]
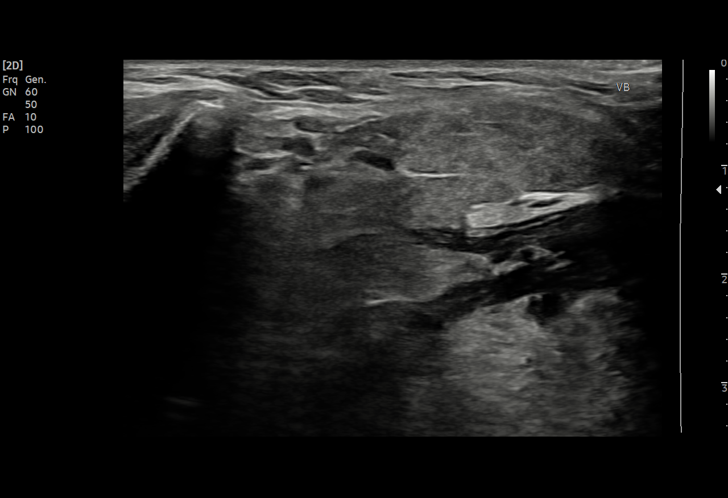
[im 13/18]
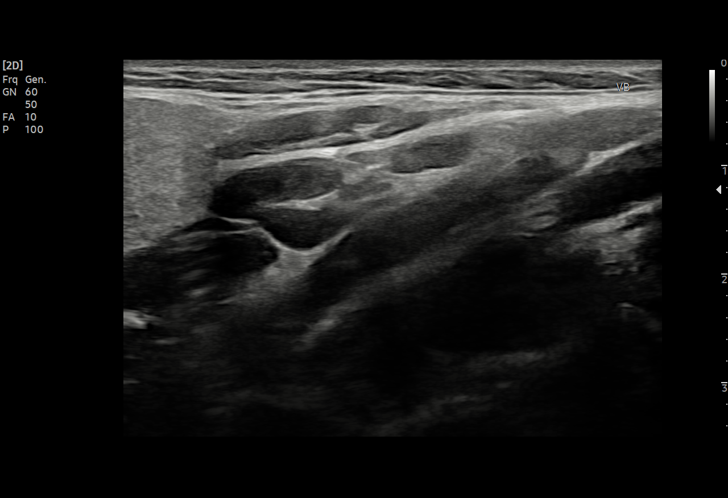
[im 14/18]
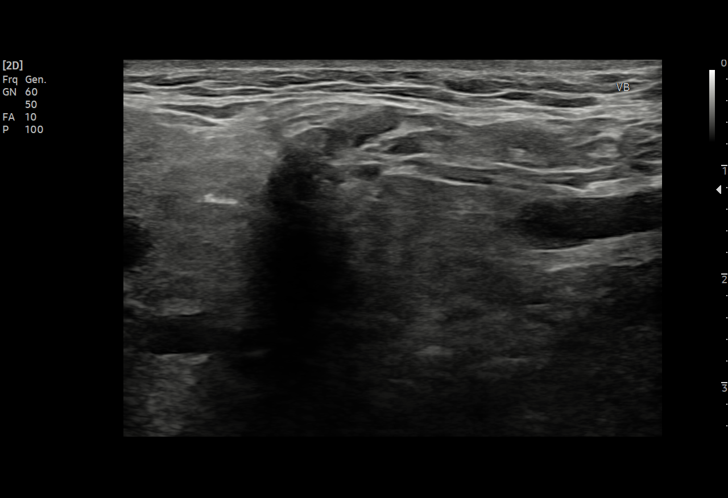
[im 16/18]
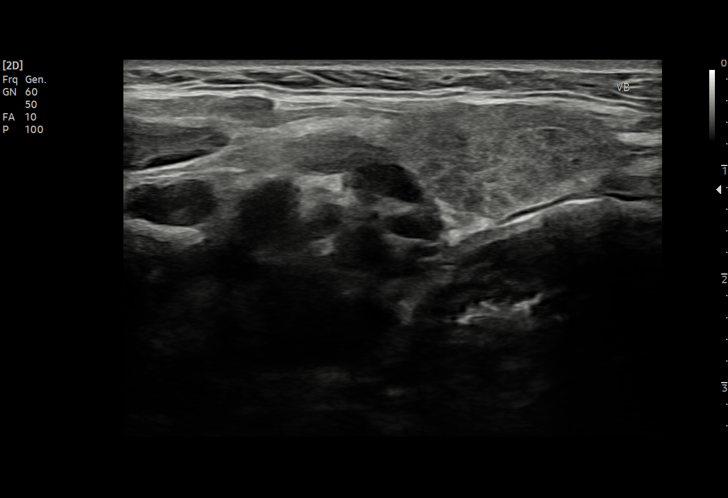
[im 17/18]
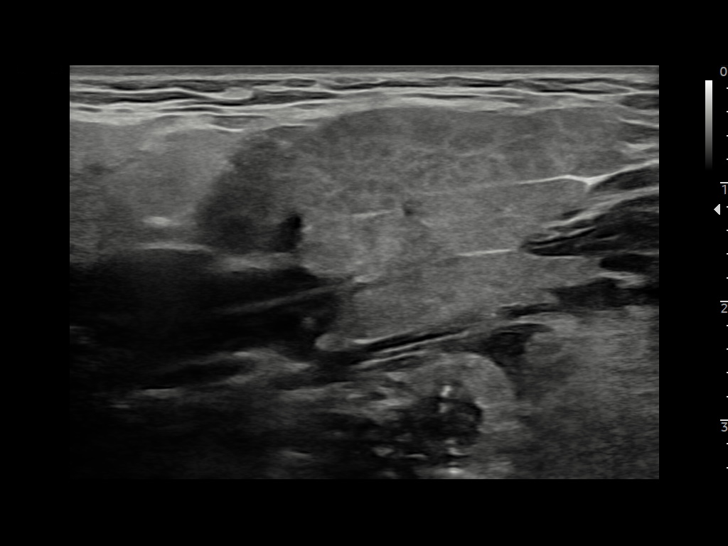
[im 18/18]
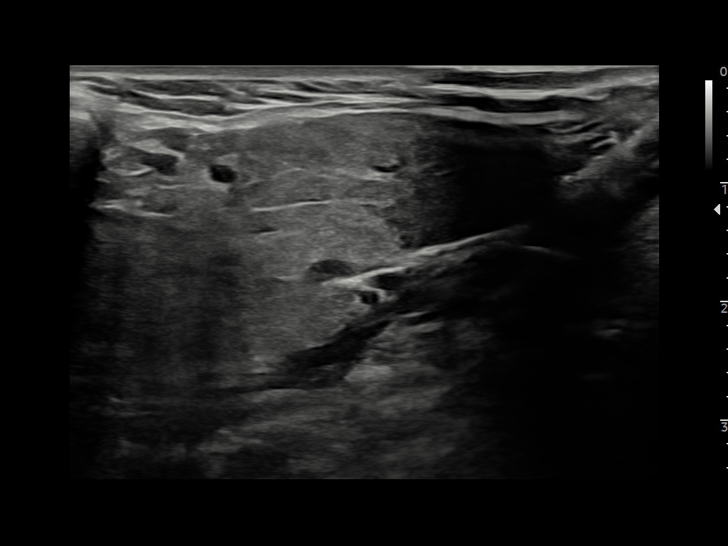

[15 of 18 positions shown; findings below may reference images not displayed]

FINDINGS: Ultrasound performed of the left submandibular area of concern and
correlated with the right submandibular area.

In the left submandibular area of concern, there are submandibular
and cervical region benign-appearing lymph nodes with preserved
hypoechoic cortex and fatty hila. Lymph nodes have short axis
measurements of 6 mm or less. No other regional soft tissue
abnormality, mass, cyst or fluid collection. Submandibular glands
appear symmetric.
IMPRESSION: Benign-appearing left submandibular region lymph nodes.

## 2021-12-09 ENCOUNTER — Ambulatory Visit: Payer: Self-pay | Admitting: Physician Assistant

## 2022-01-06 ENCOUNTER — Ambulatory Visit: Payer: Self-pay | Admitting: Physician Assistant

## 2022-03-02 ENCOUNTER — Other Ambulatory Visit (HOSPITAL_COMMUNITY): Payer: Self-pay

## 2022-03-03 ENCOUNTER — Encounter (HOSPITAL_COMMUNITY): Payer: Self-pay

## 2022-03-03 ENCOUNTER — Other Ambulatory Visit (HOSPITAL_COMMUNITY): Payer: Self-pay

## 2022-03-10 ENCOUNTER — Other Ambulatory Visit: Payer: Self-pay

## 2022-04-06 ENCOUNTER — Other Ambulatory Visit: Payer: Self-pay

## 2022-04-06 ENCOUNTER — Ambulatory Visit: Payer: Self-pay | Attending: Nurse Practitioner | Admitting: Nurse Practitioner

## 2022-04-06 ENCOUNTER — Encounter: Payer: Self-pay | Admitting: Nurse Practitioner

## 2022-04-06 VITALS — BP 119/82 | HR 68 | Ht 65.0 in | Wt 150.0 lb

## 2022-04-06 DIAGNOSIS — Z0000 Encounter for general adult medical examination without abnormal findings: Secondary | ICD-10-CM

## 2022-04-06 DIAGNOSIS — E876 Hypokalemia: Secondary | ICD-10-CM

## 2022-04-06 DIAGNOSIS — E559 Vitamin D deficiency, unspecified: Secondary | ICD-10-CM

## 2022-04-06 DIAGNOSIS — R7989 Other specified abnormal findings of blood chemistry: Secondary | ICD-10-CM

## 2022-04-06 DIAGNOSIS — Z1159 Encounter for screening for other viral diseases: Secondary | ICD-10-CM

## 2022-04-06 DIAGNOSIS — R809 Proteinuria, unspecified: Secondary | ICD-10-CM

## 2022-04-06 DIAGNOSIS — R590 Localized enlarged lymph nodes: Secondary | ICD-10-CM

## 2022-04-06 DIAGNOSIS — Z13220 Encounter for screening for lipoid disorders: Secondary | ICD-10-CM

## 2022-04-06 DIAGNOSIS — J309 Allergic rhinitis, unspecified: Secondary | ICD-10-CM

## 2022-04-06 DIAGNOSIS — Z8709 Personal history of other diseases of the respiratory system: Secondary | ICD-10-CM

## 2022-04-06 DIAGNOSIS — D72829 Elevated white blood cell count, unspecified: Secondary | ICD-10-CM

## 2022-04-06 DIAGNOSIS — R7309 Other abnormal glucose: Secondary | ICD-10-CM

## 2022-04-06 DIAGNOSIS — K921 Melena: Secondary | ICD-10-CM

## 2022-04-06 DIAGNOSIS — R109 Unspecified abdominal pain: Secondary | ICD-10-CM

## 2022-04-06 MED ORDER — DICYCLOMINE HCL 10 MG PO CAPS
10.0000 mg | ORAL_CAPSULE | Freq: Three times a day (TID) | ORAL | 1 refills | Status: DC
  Filled 2022-04-06: qty 90, 30d supply, fill #0
  Filled 2022-05-10: qty 90, 30d supply, fill #1

## 2022-04-06 MED ORDER — FLUTICASONE PROPIONATE 50 MCG/ACT NA SUSP
1.0000 | Freq: Every day | NASAL | 1 refills | Status: AC
  Filled 2022-04-06: qty 16, 30d supply, fill #0
  Filled 2022-05-10 – 2023-02-05 (×2): qty 16, 30d supply, fill #1

## 2022-04-06 MED ORDER — ALBUTEROL SULFATE HFA 108 (90 BASE) MCG/ACT IN AERS
INHALATION_SPRAY | RESPIRATORY_TRACT | 0 refills | Status: AC
  Filled 2022-04-06: qty 6.7, 25d supply, fill #0

## 2022-04-06 NOTE — Progress Notes (Signed)
Assessment & Plan:  Stephanie Young was seen today for annual exam.  Diagnoses and all orders for this visit:  Encounter for annual physical exam -     Hemoglobin A1c -     CBC with Differential -     Lipid panel -     Urinalysis, Complete -     Microalbumin / creatinine urine ratio -     Thyroid Panel With TSH -     VITAMIN D 25 Hydroxy (Vit-D Deficiency, Fractures) -     CMP14+EGFR -     US Soft Tissue Head/Neck (NON-THYROID); Future  Abdominal cramping -     dicyclomine (BENTYL) 10 MG capsule; Take 1 capsule (10 mg total) by mouth 3 (three) times daily before meals.  Need for hepatitis C screening test -     HCV Ab w Reflex to Quant PCR -     Microscopic Examination -     Interpretation:  Leukocytosis, unspecified type -     CBC with Differential  Vitamin D deficiency disease -     VITAMIN D 25 Hydroxy (Vit-D Deficiency, Fractures)  Low TSH level -     Thyroid Panel With TSH  Blood in the stool -     Fecal occult blood, imunochemical([facility])  Proteinuria, unspecified type -     Urinalysis, Complete -     Microalbumin / creatinine urine ratio  Elevated glucose -     Hemoglobin A1c  Lipid screening -     Lipid panel  Hypokalemia -     CMP14+EGFR  Submandibular lymphadenopathy Last [place] 11-26-2020 was normal however Stephanie Young feels the lymph node has increased in size -     US Soft Tissue Head/Neck (NON-THYROID); Future  Allergic rhinitis, unspecified seasonality, unspecified trigger -     fluticasone (FLONASE) 50 MCG/ACT nasal spray; Place 1 spray into both nostrils daily.  History of ARDS -     albuterol (PROVENTIL HFA) 108 (90 Base) MCG/ACT inhaler; Inhale 2 puffs into the lungs every 6 hours as needed for wheezing and shortness of breath.    Patient has been counseled on age-appropriate routine health concerns for screening and prevention. These are reviewed and up-to-date. Referrals have been placed accordingly. Immunizations are up-to-date or  declined.    Subjective:   Chief Complaint  Patient presents with   Annual Exam   HPI Stephanie Young 32 y.o. female presents to office today for annual physical  She has a past medical history of Anemia, ARDS (vaping), H. pylori infection, and kidney stones.   BP Readings from Last 3 Encounters:  04/06/22 119/82  05/03/19 124/62  05/01/19 123/73     Review of Systems  Constitutional:  Negative for fever, malaise/fatigue and weight loss.  HENT: Negative.  Negative for nosebleeds.   Eyes: Negative.  Negative for blurred vision, double vision and photophobia.  Respiratory: Negative.  Negative for cough and shortness of breath.   Cardiovascular: Negative.  Negative for chest pain, palpitations and leg swelling.  Gastrointestinal: Negative.  Negative for heartburn, nausea and vomiting.  Genitourinary: Negative.   Musculoskeletal: Negative.  Negative for myalgias.  Skin: Negative.   Neurological: Negative.  Negative for dizziness, focal weakness, seizures and headaches.  Endo/Heme/Allergies: Negative.   Psychiatric/Behavioral: Negative.  Negative for suicidal ideas.     Past Medical History:  Diagnosis Date   Anemia    H. pylori infection    History of kidney stones     Past Surgical History:  Procedure Laterality  Date   dental procedure     FLEXIBLE SIGMOIDOSCOPY N/A 01/30/2018   Procedure: FLEXIBLE SIGMOIDOSCOPY WITH PROPOFOL;  Surgeon: Daneil Dolin, MD;  Location: [facility];  Service: Endoscopy;  Laterality: N/A;  1:45pm    Family History  Problem Relation Age of Onset   Breast cancer Mother 65   Hypertension Mother    Hypertension Brother    Diabetes Maternal Uncle    Diabetes Other    Colon cancer Neg Hx    Colon polyps Neg Hx     Social History Reviewed with no changes to be made today.   Outpatient Medications Prior to Visit  Medication Sig Dispense Refill   acetaminophen (TYLENOL) 500 MG tablet Take 1,000 mg by mouth every 6 (six) hours as  needed for moderate pain.      cetirizine (ZYRTEC) 10 MG tablet TAKE 1 TABLET(10 MG) BY MOUTH DAILY 30 tablet 2   ibuprofen (ADVIL) 800 MG tablet Take 1 tablet (800 mg total) by mouth every 8 (eight) hours as needed. 60 tablet 0   ketoconazole (NIZORAL) 2 % shampoo Apply 1 application topically 2 (two) times a week. 120 mL 1   meloxicam (MOBIC) 7.5 MG tablet Take 1 tablet (7.5 mg total) by mouth daily. 30 tablet 0   Multiple Vitamin (MULTIVITAMIN WITH MINERALS) TABS tablet Take 1 tablet by mouth daily. 30 tablet 0   tiZANidine (ZANAFLEX) 4 MG tablet Take 1 tablet (4 mg total) by mouth every 8 (eight) hours as needed for muscle spasms. 30 tablet 0   triamcinolone ointment (KENALOG) 0.5 % Apply topically 3 (three) times daily. 60 g 1   vitamin B-12 (CYANOCOBALAMIN) 500 MCG tablet Take 500 mcg by mouth daily.     albuterol (PROVENTIL HFA) 108 (90 Base) MCG/ACT inhaler Inhale 2 puffs into the lungs every 6 hours as needed for wheezing and shortness of breath. 6.7 g 0   dicyclomine (BENTYL) 10 MG capsule Take 1 capsule by mouth 3 (three) times daily before meals) must have office visit for more refills 90 capsule 1   fluticasone (FLONASE) 50 MCG/ACT nasal spray Place 1 spray into both nostrils daily. 16 g 1   mirtazapine (REMERON) 30 MG tablet Take 1 tablet(s) by mouth at bedtime as needed for sleep 30 tablet 1   amitriptyline (ELAVIL) 25 MG tablet Take 1 tablet (25 mg total) by mouth at bedtime. (Patient not taking: Reported on 04/06/2022) 90 tablet 1   Amphetamine ER (DYANAVEL XR) 10 MG CHER Take 1 tablet(s) by mouth every morning for ADHD (Patient not taking: Reported on 04/06/2022) 30 tablet 0   benzonatate (TESSALON) 200 MG capsule Take 1 capsule (200 mg total) by mouth 2 (two) times daily as needed for cough. (Patient not taking: Reported on 04/06/2022) 20 capsule 0   busPIRone (BUSPAR) 10 MG tablet Take 1 tablet(s) by mouth twice a day for anxiety/panic attacks (Patient not taking: Reported on  04/06/2022) 60 tablet 0   busPIRone (BUSPAR) 5 MG tablet Take 5 mg by mouth 3 (three) times daily. (Patient not taking: Reported on 04/06/2022)     busPIRone (BUSPAR) 5 MG tablet Take 1 tablet by mouth twice a day for severe anxiety/panic attacks (Patient not taking: Reported on 04/06/2022) 60 tablet 1   DULoxetine (CYMBALTA) 20 MG capsule Take 20 mg by mouth daily. (Patient not taking: Reported on 04/06/2022)     FLUoxetine (PROZAC) 40 MG capsule Take 1 capsule by mouth daily for anxiety (Patient not taking: Reported on  04/06/2022) 30 capsule 0   folic acid (FOLVITE) 1 MG tablet Take 1 mg by mouth daily. (Patient not taking: Reported on 04/06/2022)     methylphenidate (CONCERTA) 27 MG PO CR tablet Take 1 tablet by mouth every morning for ADHD ** DNF 08/28/21 (Patient not taking: Reported on 04/06/2022) 30 tablet 0   methylphenidate (CONCERTA) 27 MG PO CR tablet Take 1 tablet by mouth every morning for ADHD 30 tablet 0   mirtazapine (REMERON) 30 MG tablet Take 1 tablet (30 mg total) by mouth at bedtime as needed for sleep (Patient not taking: Reported on 04/06/2022) 30 tablet 1   mirtazapine (REMERON) 30 MG tablet Take 1 tablet by mouth at bedtime for sleep. (Patient not taking: Reported on 04/06/2022) 30 tablet 1   mirtazapine (REMERON) 30 MG tablet Take 1 tablet by mouth at bedtime  for sleep (Patient not taking: Reported on 04/06/2022) 30 tablet 1   mirtazapine (REMERON) 30 MG tablet Take 1 tablet by mouth at bedtime  for sleep (Patient not taking: Reported on 04/06/2022) 30 tablet 0   promethazine-dextromethorphan (PROMETHAZINE-DM) 6.25-15 MG/5ML syrup Take 5 mLs by mouth 4 (four) times daily as needed for cough. (Patient not taking: Reported on 04/06/2022) 240 mL 0   traZODone (DESYREL) 50 MG tablet Take 1/2 tablet(s) by mouth at bedtime as needed for sleep (Patient not taking: Reported on 04/06/2022) 15 tablet 0   Vitamin D, Ergocalciferol, (DRISDOL) 1.25 MG (50000 UNIT) CAPS capsule TAKE 1 CAPSULE BY MOUTH  EVERY 7 DAYS (Patient not taking: Reported on 04/06/2022) 16 capsule 0   No facility-administered medications prior to visit.    Allergies  Allergen Reactions   Cephalosporins Rash    Cefepime       Objective:    BP 119/82   Pulse 68   Ht 5\' 5"  (1.651 m)   Wt 150 lb (68 kg)   LMP 03/29/2022 (Exact Date)   SpO2 100%   BMI 24.96 kg/m  Wt Readings from Last 3 Encounters:  04/06/22 150 lb (68 kg)  05/03/19 149 lb (67.6 kg)  05/01/19 149 lb 12.8 oz (67.9 kg)    Physical Exam Vitals and nursing note reviewed.  Constitutional:      Appearance: She is well-developed.  HENT:     Head: Normocephalic and atraumatic.  Neck:   Cardiovascular:     Rate and Rhythm: Normal rate and regular rhythm.     Heart sounds: Normal heart sounds. No murmur heard.    No friction rub. No gallop.  Pulmonary:     Effort: Pulmonary effort is normal. No tachypnea or respiratory distress.     Breath sounds: Normal breath sounds. No decreased breath sounds, wheezing, rhonchi or rales.  Chest:     Chest wall: No tenderness.  Abdominal:     General: Bowel sounds are normal.     Palpations: Abdomen is soft.  Musculoskeletal:        General: Normal range of motion.     Cervical back: Normal range of motion.  Skin:    General: Skin is warm and dry.  Neurological:     Mental Status: She is alert and oriented to person, place, and time.     Coordination: Coordination normal.  Psychiatric:        Behavior: Behavior normal. Behavior is cooperative.        Thought Content: Thought content normal.        Judgment: Judgment normal.  Patient has been counseled extensively about nutrition and exercise as well as the importance of adherence with medications and regular follow-up. The patient was given clear instructions to go to ER or return to medical center if symptoms don't improve, worsen or new problems develop. The patient verbalized understanding.   Follow-up: Return in about 4 months  (around 08/05/2022) for PAP SMEAR.   Claiborne Rigg, FNP-BC [facility] [place], [place] [phone]   04/14/2022, 10:42 PM

## 2022-04-07 ENCOUNTER — Other Ambulatory Visit (HOSPITAL_COMMUNITY): Payer: Self-pay

## 2022-04-07 ENCOUNTER — Other Ambulatory Visit: Payer: Self-pay | Admitting: Nurse Practitioner

## 2022-04-07 ENCOUNTER — Encounter: Payer: Self-pay | Admitting: Nurse Practitioner

## 2022-04-07 DIAGNOSIS — R809 Proteinuria, unspecified: Secondary | ICD-10-CM

## 2022-04-07 MED ORDER — MIRTAZAPINE 30 MG PO TABS
30.0000 mg | ORAL_TABLET | Freq: Every day | ORAL | 1 refills | Status: DC
  Filled 2022-04-07: qty 90, 90d supply, fill #0
  Filled 2022-07-03: qty 90, 90d supply, fill #1

## 2022-04-08 ENCOUNTER — Encounter (HOSPITAL_COMMUNITY): Payer: Self-pay

## 2022-04-08 ENCOUNTER — Other Ambulatory Visit (HOSPITAL_COMMUNITY): Payer: Self-pay

## 2022-04-08 ENCOUNTER — Other Ambulatory Visit: Payer: Self-pay

## 2022-04-09 ENCOUNTER — Other Ambulatory Visit: Payer: Self-pay

## 2022-04-09 ENCOUNTER — Other Ambulatory Visit (HOSPITAL_COMMUNITY): Payer: Self-pay

## 2022-04-09 ENCOUNTER — Other Ambulatory Visit: Payer: Self-pay | Admitting: Nurse Practitioner

## 2022-04-09 LAB — CMP14+EGFR
ALT: 29 IU/L (ref 0–32)
AST: 30 IU/L (ref 0–40)
Albumin/Globulin Ratio: 1.5 (ref 1.2–2.2)
Albumin: 4.4 g/dL (ref 4.0–5.0)
Alkaline Phosphatase: 57 IU/L (ref 44–121)
BUN/Creatinine Ratio: 9 (ref 9–23)
BUN: 7 mg/dL (ref 6–20)
Bilirubin Total: 0.7 mg/dL (ref 0.0–1.2)
CO2: 24 mmol/L (ref 20–29)
Calcium: 9.7 mg/dL (ref 8.7–10.2)
Chloride: 97 mmol/L (ref 96–106)
Creatinine, Ser: 0.81 mg/dL (ref 0.57–1.00)
Globulin, Total: 2.9 g/dL (ref 1.5–4.5)
Glucose: 87 mg/dL (ref 70–99)
Potassium: 4 mmol/L (ref 3.5–5.2)
Sodium: 138 mmol/L (ref 134–144)
Total Protein: 7.3 g/dL (ref 6.0–8.5)
eGFR: 100 mL/min/{1.73_m2} (ref >59)

## 2022-04-09 LAB — VITAMIN D 25 HYDROXY (VIT D DEFICIENCY, FRACTURES): Vit D, 25-Hydroxy: 19.1 ng/mL — ABNORMAL LOW (ref 30.0–100.0)

## 2022-04-09 LAB — URINALYSIS, COMPLETE
Bilirubin, UA: NEGATIVE
Glucose, UA: NEGATIVE
Ketones, UA: NEGATIVE
Leukocytes,UA: NEGATIVE
Nitrite, UA: NEGATIVE
RBC, UA: NEGATIVE
Specific Gravity, UA: 1.02 (ref 1.005–1.030)
Urobilinogen, Ur: 0.2 mg/dL (ref 0.2–1.0)
pH, UA: >=9 — AB (ref 5.0–7.5)

## 2022-04-09 LAB — LIPID PANEL
Chol/HDL Ratio: 2.5 ratio (ref 0.0–4.4)
Cholesterol, Total: 187 mg/dL (ref 100–199)
HDL: 75 mg/dL (ref >39)
LDL Chol Calc (NIH): 100 mg/dL — ABNORMAL HIGH (ref 0–99)
Triglycerides: 67 mg/dL (ref 0–149)
VLDL Cholesterol Cal: 12 mg/dL (ref 5–40)

## 2022-04-09 LAB — CBC WITH DIFFERENTIAL/PLATELET
Basophils Absolute: 0.1 10*3/uL (ref 0.0–0.2)
Basos: 2 %
EOS (ABSOLUTE): 0.1 10*3/uL (ref 0.0–0.4)
Eos: 2 %
Hematocrit: 41.3 % (ref 34.0–46.6)
Hemoglobin: 13.8 g/dL (ref 11.1–15.9)
Immature Grans (Abs): 0 10*3/uL (ref 0.0–0.1)
Immature Granulocytes: 0 %
Lymphocytes Absolute: 1.8 10*3/uL (ref 0.7–3.1)
Lymphs: 24 %
MCH: 27.2 pg (ref 26.6–33.0)
MCHC: 33.4 g/dL (ref 31.5–35.7)
MCV: 81 fL (ref 79–97)
Monocytes Absolute: 0.5 10*3/uL (ref 0.1–0.9)
Monocytes: 7 %
Neutrophils Absolute: 5 10*3/uL (ref 1.4–7.0)
Neutrophils: 65 %
Platelets: 474 10*3/uL — ABNORMAL HIGH (ref 150–450)
RBC: 5.08 x10E6/uL (ref 3.77–5.28)
RDW: 14 % (ref 11.7–15.4)
WBC: 7.6 10*3/uL (ref 3.4–10.8)

## 2022-04-09 LAB — THYROID PANEL WITH TSH
Free Thyroxine Index: 2.2 (ref 1.2–4.9)
T3 Uptake Ratio: 26 % (ref 24–39)
T4, Total: 8.3 ug/dL (ref 4.5–12.0)
TSH: 0.804 u[IU]/mL (ref 0.450–4.500)

## 2022-04-09 LAB — HEMOGLOBIN A1C
Est. average glucose Bld gHb Est-mCnc: 108 mg/dL
Hgb A1c MFr Bld: 5.4 % (ref 4.8–5.6)

## 2022-04-09 LAB — MICROSCOPIC EXAMINATION
Bacteria, UA: NONE SEEN
Casts: NONE SEEN /lpf
Epithelial Cells (non renal): NONE SEEN /hpf (ref 0–10)
WBC, UA: NONE SEEN /hpf (ref 0–5)

## 2022-04-09 LAB — HCV INTERPRETATION

## 2022-04-09 LAB — MICROALBUMIN / CREATININE URINE RATIO
Creatinine, Urine: 125 mg/dL
Microalb/Creat Ratio: 4 mg/g creat (ref 0–29)
Microalbumin, Urine: 5.1 ug/mL

## 2022-04-09 LAB — HCV AB W REFLEX TO QUANT PCR: HCV Ab: NONREACTIVE

## 2022-04-09 MED ORDER — VITAMIN D (ERGOCALCIFEROL) 1.25 MG (50000 UNIT) PO CAPS
50000.0000 [IU] | ORAL_CAPSULE | ORAL | 0 refills | Status: AC
  Filled 2022-04-09: qty 12, 84d supply, fill #0

## 2022-04-14 ENCOUNTER — Encounter: Payer: Self-pay | Admitting: Nurse Practitioner

## 2022-04-24 ENCOUNTER — Other Ambulatory Visit (HOSPITAL_COMMUNITY): Payer: Self-pay

## 2022-04-26 ENCOUNTER — Other Ambulatory Visit (HOSPITAL_COMMUNITY): Payer: Self-pay

## 2022-04-26 ENCOUNTER — Other Ambulatory Visit: Payer: Self-pay

## 2022-04-27 ENCOUNTER — Other Ambulatory Visit (HOSPITAL_COMMUNITY): Payer: Self-pay

## 2022-04-30 ENCOUNTER — Other Ambulatory Visit: Payer: Self-pay

## 2022-05-10 ENCOUNTER — Other Ambulatory Visit (HOSPITAL_COMMUNITY): Payer: Self-pay

## 2022-05-10 ENCOUNTER — Other Ambulatory Visit: Payer: Self-pay | Admitting: Family Medicine

## 2022-05-10 DIAGNOSIS — N946 Dysmenorrhea, unspecified: Secondary | ICD-10-CM

## 2022-05-10 MED ORDER — IBUPROFEN 800 MG PO TABS
800.0000 mg | ORAL_TABLET | Freq: Three times a day (TID) | ORAL | 0 refills | Status: DC | PRN
  Filled 2022-05-10 – 2022-06-01 (×2): qty 60, 20d supply, fill #0

## 2022-05-11 ENCOUNTER — Other Ambulatory Visit: Payer: Self-pay

## 2022-05-13 ENCOUNTER — Encounter (HOSPITAL_COMMUNITY): Payer: Self-pay

## 2022-05-13 ENCOUNTER — Other Ambulatory Visit (HOSPITAL_COMMUNITY): Payer: Self-pay

## 2022-05-14 ENCOUNTER — Other Ambulatory Visit: Payer: Self-pay

## 2022-06-01 ENCOUNTER — Other Ambulatory Visit (HOSPITAL_COMMUNITY): Payer: Self-pay

## 2022-06-02 ENCOUNTER — Other Ambulatory Visit (HOSPITAL_COMMUNITY): Payer: Self-pay

## 2022-06-07 ENCOUNTER — Other Ambulatory Visit (HOSPITAL_BASED_OUTPATIENT_CLINIC_OR_DEPARTMENT_OTHER): Payer: Self-pay

## 2022-06-07 ENCOUNTER — Emergency Department (HOSPITAL_BASED_OUTPATIENT_CLINIC_OR_DEPARTMENT_OTHER): Payer: Managed Care, Other (non HMO)

## 2022-06-07 ENCOUNTER — Other Ambulatory Visit: Payer: Self-pay

## 2022-06-07 ENCOUNTER — Encounter (HOSPITAL_BASED_OUTPATIENT_CLINIC_OR_DEPARTMENT_OTHER): Payer: Self-pay

## 2022-06-07 ENCOUNTER — Emergency Department (HOSPITAL_BASED_OUTPATIENT_CLINIC_OR_DEPARTMENT_OTHER)
Admission: EM | Admit: 2022-06-07 | Discharge: 2022-06-07 | Disposition: A | Discharge status: Home / Self Care | Payer: Managed Care, Other (non HMO) | Attending: Emergency Medicine | Admitting: Emergency Medicine

## 2022-06-07 DIAGNOSIS — R197 Diarrhea, unspecified: Secondary | ICD-10-CM | POA: Diagnosis not present

## 2022-06-07 DIAGNOSIS — R1030 Lower abdominal pain, unspecified: Secondary | ICD-10-CM | POA: Diagnosis not present

## 2022-06-07 DIAGNOSIS — R112 Nausea with vomiting, unspecified: Secondary | ICD-10-CM | POA: Insufficient documentation

## 2022-06-07 DIAGNOSIS — R109 Unspecified abdominal pain: Secondary | ICD-10-CM | POA: Diagnosis present

## 2022-06-07 LAB — COMPREHENSIVE METABOLIC PANEL
ALT: 17 U/L (ref 0–44)
AST: 19 U/L (ref 15–41)
Albumin: 4.7 g/dL (ref 3.5–5.0)
Alkaline Phosphatase: 42 U/L (ref 38–126)
Anion gap: 12 (ref 5–15)
BUN: 12 mg/dL (ref 6–20)
CO2: 28 mmol/L (ref 22–32)
Calcium: 11.1 mg/dL — ABNORMAL HIGH (ref 8.9–10.3)
Chloride: 100 mmol/L (ref 98–111)
Creatinine, Ser: 0.93 mg/dL (ref 0.44–1.00)
GFR, Estimated: >60 mL/min (ref >60)
Glucose, Bld: 126 mg/dL — ABNORMAL HIGH (ref 70–99)
Potassium: 3.4 mmol/L — ABNORMAL LOW (ref 3.5–5.1)
Sodium: 140 mmol/L (ref 135–145)
Total Bilirubin: 0.9 mg/dL (ref 0.3–1.2)
Total Protein: 7.7 g/dL (ref 6.5–8.1)

## 2022-06-07 LAB — CBC
HCT: 42.5 % (ref 36.0–46.0)
Hemoglobin: 13.7 g/dL (ref 12.0–15.0)
MCH: 27.3 pg (ref 26.0–34.0)
MCHC: 32.2 g/dL (ref 30.0–36.0)
MCV: 84.8 fL (ref 80.0–100.0)
Platelets: 501 10*3/uL — ABNORMAL HIGH (ref 150–400)
RBC: 5.01 MIL/uL (ref 3.87–5.11)
RDW: 14.4 % (ref 11.5–15.5)
WBC: 15 10*3/uL — ABNORMAL HIGH (ref 4.0–10.5)
nRBC: 0 % (ref 0.0–0.2)

## 2022-06-07 LAB — URINALYSIS, ROUTINE W REFLEX MICROSCOPIC
Bilirubin Urine: NEGATIVE
Glucose, UA: NEGATIVE mg/dL
Hgb urine dipstick: NEGATIVE
Nitrite: NEGATIVE
Specific Gravity, Urine: 1.03 (ref 1.005–1.030)
pH: 7.5 (ref 5.0–8.0)

## 2022-06-07 LAB — LIPASE, BLOOD: Lipase: <10 U/L — ABNORMAL LOW (ref 11–51)

## 2022-06-07 LAB — PREGNANCY, URINE: Preg Test, Ur: NEGATIVE

## 2022-06-07 MED ORDER — SODIUM CHLORIDE 0.9 % IV BOLUS
1000.0000 mL | Freq: Once | INTRAVENOUS | Status: AC
  Administered 2022-06-07: 1000 mL via INTRAVENOUS

## 2022-06-07 MED ORDER — METOCLOPRAMIDE HCL 5 MG/ML IJ SOLN
10.0000 mg | Freq: Once | INTRAMUSCULAR | Status: AC
  Administered 2022-06-07: 10 mg via INTRAVENOUS
  Filled 2022-06-07: qty 2

## 2022-06-07 MED ORDER — NITROFURANTOIN MONOHYD MACRO 100 MG PO CAPS
100.0000 mg | ORAL_CAPSULE | Freq: Two times a day (BID) | ORAL | 0 refills | Status: AC
  Filled 2022-06-07: qty 10, 5d supply, fill #0

## 2022-06-07 MED ORDER — MORPHINE SULFATE (PF) 4 MG/ML IV SOLN
4.0000 mg | Freq: Once | INTRAVENOUS | Status: AC
  Administered 2022-06-07: 4 mg via INTRAVENOUS
  Filled 2022-06-07: qty 1

## 2022-06-07 MED ORDER — IOHEXOL 350 MG/ML SOLN
100.0000 mL | Freq: Once | INTRAVENOUS | Status: AC | PRN
  Administered 2022-06-07: 60 mL via INTRAVENOUS

## 2022-06-07 MED ORDER — METOCLOPRAMIDE HCL 10 MG PO TABS
10.0000 mg | ORAL_TABLET | Freq: Four times a day (QID) | ORAL | 0 refills | Status: AC | PRN
  Filled 2022-06-07: qty 20, 5d supply, fill #0

## 2022-06-07 MED ORDER — ONDANSETRON HCL 4 MG/2ML IJ SOLN
4.0000 mg | Freq: Once | INTRAMUSCULAR | Status: AC
  Administered 2022-06-07: 4 mg via INTRAVENOUS
  Filled 2022-06-07: qty 2

## 2022-06-07 NOTE — ED Provider Notes (Signed)
[facility] Provider Note   CSN: [id number] Arrival date & time: 06/07/22  1145     History  Chief Complaint  Patient presents with   Abdominal Pain    Stephanie Young is a 32 y.o. female with past medical history significant for anemia, H. pylori infection, kidney stones, anemia, hemorrhoids presents to the ED complaining of abdominal pain with associated nausea, vomiting, and diarrhea that began today.  Patient reports the pain is cramping-like and is severe.  Pain is intermittent.  Patient has episodes of vomiting when she has this pain.  She reports in between the cramping episodes, her abdomen feels "sore".  Patient states she has been having these intermittent episodes for the past 2 months, but today was the worst one.  Denies fever, chills, dysuria, difficulty urinating, decreased urine, vaginal bleeding, vaginal discharge, syncope, weakness, lightheadedness, dizziness, blood in stool.       Home Medications Prior to Admission medications   Medication Sig Start Date End Date Taking? Authorizing Provider  metoCLOPramide (REGLAN) 10 MG tablet Take 1 tablet (10 mg total) by mouth every 6 (six) hours as needed for nausea or vomiting. 06/07/22  Yes Tatum Massman R, PA-C  nitrofurantoin, macrocrystal-monohydrate, (MACROBID) 100 MG capsule Take 1 capsule (100 mg total) by mouth 2 (two) times daily. 06/07/22  Yes Lander Eslick R, PA-C  acetaminophen (TYLENOL) 500 MG tablet Take 1,000 mg by mouth every 6 (six) hours as needed for moderate pain.     [provider]  albuterol (PROVENTIL HFA) 108 (90 Base) MCG/ACT inhaler Inhale 2 puffs into the lungs every 6 hours as needed for wheezing and shortness of breath. 04/06/22   Gildardo Pounds, NP  cetirizine (ZYRTEC) 10 MG tablet TAKE 1 TABLET(10 MG) BY MOUTH DAILY 08/29/20   Charlott Rakes, MD  dicyclomine (BENTYL) 10 MG capsule Take 1 capsule (10 mg total) by mouth 3 (three) times daily before  meals. 04/06/22   Gildardo Pounds, NP  fluticasone (FLONASE) 50 MCG/ACT nasal spray Place 1 spray into both nostrils daily. 04/06/22   Gildardo Pounds, NP  ibuprofen (ADVIL) 800 MG tablet Take 1 tablet (800 mg total) by mouth every 8 (eight) hours as needed. 05/10/22   Charlott Rakes, MD  ketoconazole (NIZORAL) 2 % shampoo Apply 1 application topically 2 (two) times a week. 02/09/21   Gildardo Pounds, NP  meloxicam (MOBIC) 7.5 MG tablet Take 1 tablet (7.5 mg total) by mouth daily. 03/18/20   Gildardo Pounds, NP  mirtazapine (REMERON) 30 MG tablet Take 1 tablet (30 mg total) by mouth at bedtime. 04/07/22   Gildardo Pounds, NP  Multiple Vitamin (MULTIVITAMIN WITH MINERALS) TABS tablet Take 1 tablet by mouth daily. 11/02/17   Bonnielee Haff, MD  tiZANidine (ZANAFLEX) 4 MG tablet Take 1 tablet (4 mg total) by mouth every 8 (eight) hours as needed for muscle spasms. 06/22/21   Charlott Rakes, MD  triamcinolone ointment (KENALOG) 0.5 % Apply topically 3 (three) times daily. 08/26/21   Gildardo Pounds, NP  vitamin B-12 (CYANOCOBALAMIN) 500 MCG tablet Take 500 mcg by mouth daily.    [provider]  Vitamin D, Ergocalciferol, (DRISDOL) 1.25 MG (50000 UNIT) CAPS capsule Take 1 capsule (50,000 Units total) by mouth every 7 (seven) days. 04/09/22   Gildardo Pounds, NP      Allergies    Sulfa antibiotics and Cephalosporins    Review of Systems   Review of Systems  Constitutional:  Negative for fever.  Gastrointestinal:  Positive for abdominal pain, constipation, nausea and vomiting. Negative for blood in stool and diarrhea.  Genitourinary:  Negative for difficulty urinating, dysuria, flank pain, vaginal bleeding and vaginal discharge.  Neurological:  Negative for dizziness, syncope, weakness and light-headedness.    Physical Exam Updated Vital Signs BP 113/69   Pulse (!) 55   Temp 97.9 F (36.6 C) (Axillary)   Resp 18   Ht 5\' 5"  (1.651 m)   Wt 66.2 kg   SpO2 100%   BMI 24.30 kg/m   Physical Exam Vitals and nursing note reviewed.  Constitutional:      General: She is not in acute distress.    Appearance: She is not ill-appearing.  HENT:     Mouth/Throat:     Mouth: Mucous membranes are moist.     Pharynx: Oropharynx is clear.  Cardiovascular:     Rate and Rhythm: Normal rate and regular rhythm.     Pulses: Normal pulses.     Heart sounds: Normal heart sounds.  Pulmonary:     Effort: Pulmonary effort is normal. No respiratory distress.     Breath sounds: Normal breath sounds and air entry.  Abdominal:     General: Abdomen is flat. Bowel sounds are normal. There is no distension.     Palpations: Abdomen is soft.     Tenderness: There is abdominal tenderness. There is no right CVA tenderness or left CVA tenderness.  Skin:    General: Skin is warm and dry.     Capillary Refill: Capillary refill takes less than 2 seconds.  Neurological:     Mental Status: She is alert. Mental status is at baseline.  Psychiatric:        Mood and Affect: Mood normal.        Behavior: Behavior normal.     ED Results / Procedures / Treatments   Labs (all labs ordered are listed, but only abnormal results are displayed) Labs Reviewed  LIPASE, BLOOD - Abnormal; Notable for the following components:      Result Value   Lipase <10 (*)    All other components within normal limits  COMPREHENSIVE METABOLIC PANEL - Abnormal; Notable for the following components:   Potassium 3.4 (*)    Glucose, Bld 126 (*)    Calcium 11.1 (*)    All other components within normal limits  CBC - Abnormal; Notable for the following components:   WBC 15.0 (*)    Platelets 501 (*)    All other components within normal limits  URINALYSIS, ROUTINE W REFLEX MICROSCOPIC - Abnormal; Notable for the following components:   Color, Urine BROWN (*)    APPearance HAZY (*)    Ketones, ur TRACE (*)    Protein, ur TRACE (*)    Leukocytes,Ua TRACE (*)    Bacteria, UA RARE (*)    All other components within  normal limits  PREGNANCY, URINE    EKG None  Radiology CT ABDOMEN PELVIS W CONTRAST  Result Date: 06/07/2022 CLINICAL DATA:  Crampy like abdominal pain. Reportedly vomited blood. Questionable blood in the stool. EXAM: CT ABDOMEN AND PELVIS WITH CONTRAST TECHNIQUE: Multidetector CT imaging of the abdomen and pelvis was performed using the standard protocol following bolus administration of intravenous contrast. RADIATION DOSE REDUCTION: This exam was performed according to the departmental dose-optimization program which includes automated exposure control, adjustment of the mA and/or kV according to patient size and/or use of iterative reconstruction technique. CONTRAST:  45mL OMNIPAQUE  IOHEXOL 350 MG/ML SOLN COMPARISON:  08/31/2016. FINDINGS: Lower chest: Lung bases essentially clear. Hepatobiliary: No focal liver abnormality is seen. No gallstones, gallbladder wall thickening, or biliary dilatation. Pancreas: Unremarkable. No pancreatic ductal dilatation or surrounding inflammatory changes. Spleen: Normal in size without focal abnormality. Adrenals/Urinary Tract: Adrenal glands are unremarkable. Kidneys are normal, without renal calculi, focal lesion, or hydronephrosis. Bladder is unremarkable. Stomach/Bowel: Normal stomach. Small bowel and colon are normal in caliber. No wall thickening. No inflammation. Mild to moderate increase in the colonic stool burden appendix not discretely seen, but no findings of acute appendicitis. Vascular/Lymphatic: No significant vascular findings are present. No enlarged abdominal or pelvic lymph nodes. Reproductive: Uterus and bilateral adnexa are unremarkable. Small amount of pelvic free fluid presumed physiologic. Other: No abdominal wall hernia or abnormality. No significant abdominopelvic ascites. Musculoskeletal: No acute fracture. 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 No bone lesion. IMPRESSION: 1. No acute findings within  the abdomen or pelvis. 2. Mild to moderate increase in the colonic stool burden. 3. 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 Electronically Signed   By: Lajean Manes M.D.   On: 06/07/2022 14:23    Procedures Procedures    Medications Ordered in ED Medications  sodium chloride 0.9 % bolus 1,000 mL (0 mLs Intravenous Stopped 06/07/22 1420)  ondansetron (ZOFRAN) injection 4 mg (4 mg Intravenous Given 06/07/22 1320)  morphine (PF) 4 MG/ML injection 4 mg (4 mg Intravenous Given 06/07/22 1321)  iohexol (OMNIPAQUE) 350 MG/ML injection 100 mL (60 mLs Intravenous Contrast Given 06/07/22 1403)  metoCLOPramide (REGLAN) injection 10 mg (10 mg Intravenous Given 06/07/22 1556)  sodium chloride 0.9 % bolus 1,000 mL (0 mLs Intravenous Stopped 06/07/22 1704)    ED Course/ Medical Decision Making/ A&P                             Medical Decision Making Amount and/or Complexity of Data Reviewed Labs: ordered. Radiology: ordered.  Risk Prescription drug management.   This patient presents to the ED with chief complaint(s) of abdominal cramping with nausea, vomiting, diarrhea for the past 2 months with pertinent past medical history of H. pylori infection, anemia, kidney stones, hemorrhoids.  The complaint involves an extensive differential diagnosis and also carries with it a high risk of complications and morbidity.    The differential diagnosis includes gastritis, gastroenteritis, norovirus, infectious diarrhea, acute cystitis, diverticulitis, pancreatitis  The initial plan is to obtain abdominal pain work-up  Initial Assessment:   On exam, patient is resting in bed and is not in acute distress.  Heart rate is in the upper 50s with regular rhythm.  Abdomen is soft with generalized tenderness, with increased tenderness in the lower quadrants.  Bowel sounds are  normal.  No appreciable hernias or rashes.  Skin is warm and dry.  During initial assessment, patient began having severe abdominal cramping and had to excuse herself to the restroom.  Patient reports that she had an episode of emesis.  Independent ECG/labs interpretation:  The following labs were independently interpreted:  CBC with leukocytosis and thrombocytosis.  No anemia.  Metabolic panel with borderline hypokalemia and hypercalcemia.  No other electrolyte derangement.  LFTs are within normal range.  Renal function is normal.  Lipase less than 10, not indicative of  pancreatitis.  Pregnancy is negative. UA with brown color and hazy appearance, rare and trace leukocytes bacteria.  There is also trace acute tones and protein.  Independent visualization and interpretation of imaging: I independently visualized the following imaging with scope of interpretation limited to determining acute life threatening conditions related to emergency care: CT abdomen pelvis, which revealed mild to moderate colonic stool burden, but no acute abdominal pelvic findings to explain patient's symptoms.  There is no evidence of diverticulitis or colitis.  I agree with radiologist interpretation.  Treatment and Reassessment: Patient initially given morphine, Zofran, and IV fluids.  Patient reports her nausea was slightly better, but was still experiencing significant abdominal cramping which resulted in her vomiting.  Will give patient another fluid bolus and try Reglan.  Upon reassessment, patient is resting comfortably in bed and reports that her symptoms are significantly improved.  Will send patient home on a prescription for Reglan and referral to gastroenterology.  Disposition:   I suspect that patient may have gastrointestinal process such as IBS or IBD that needs to be evaluated by gastroenterology.  Patient has been having these abdominal cramping episodes for the past 2 months.  Prescription for Reglan was  sent to patient's pharmacy.  Discussed use of stool softeners, increased fiber, increase water intake to help with constipation and to have regular bowel movements.  Concerned that patient may be developing a urinary tract infection given the new brown color in her urine and appearance of leukocytes and bacteria.  This may also be causing some abdominal cramping in the suprapubic region.  Patient does have elevated white count which could be inflammation or infection.  Will cover for possible UTI with antibiotics.  Patient provided with information for [facility] gastroenterology so she may follow-up.  Patient may require colonoscopy/endoscopy.  Patient is agreeable with discharge home and verbalized her understanding of discharge instructions.   The patient has been appropriately medically screened and/or stabilized in the ED. I have low suspicion for any other emergent medical condition which would require further screening, evaluation or treatment in the ED or require inpatient management. At time of discharge the patient is hemodynamically stable and in no acute distress. I have discussed work-up results and diagnosis with patient and answered all questions. Patient is agreeable with discharge plan. We discussed strict return precautions for returning to the emergency department and they verbalized understanding.            Final Clinical Impression(s) / ED Diagnoses Final diagnoses:  Lower abdominal pain    Rx / DC Orders ED Discharge Orders          Ordered    metoCLOPramide (REGLAN) 10 MG tablet  Every 6 hours PRN        06/07/22 1720    nitrofurantoin, macrocrystal-monohydrate, (MACROBID) 100 MG capsule  2 times daily        06/07/22 1723              Lenard Simmer, PA-C 06/07/22 1934    Vanetta Mulders, MD 06/11/22 1624

## 2022-06-07 NOTE — ED Triage Notes (Signed)
Patient here POV from Home.  Endorses ABD Pain and N/V/D that began today. Pain is Cramping Like. Did note some Blood in Vomit and maybe in the Stool. Pain is more oriented to the Mid/Lower ABD.   NAD Noted during Triage. A&Ox4. GCS 15. Ambulatory.

## 2022-06-07 NOTE — Discharge Instructions (Addendum)
Thank you for allowing me to be a part of your care today.    I have provided information for [facility].  I would call their office to schedule a follow-up appointment as soon as possible.  I have sent a prescription for Reglan to the pharmacy for you to take for nausea and vomiting as well as an antibiotic to treat a possible UTI you are developing.   I recommend increasing your fiber and water intake.  If you experience constipation, I recommend using MiraLax and consuming increased water.  This helps pull water into the bowel.  You may also try Colace as a stool softener.  If stool softeners do not work, try stimulant laxatives such as Dulcolax.    Return to the ED if you have worsening of your symptoms or if you have any new concerns.

## 2022-06-07 NOTE — ED Notes (Signed)
Discharge instructions, medications and follow up care reviewed and explained. Pt verbalized understanding and had no further questions.   

## 2022-06-15 ENCOUNTER — Emergency Department (HOSPITAL_BASED_OUTPATIENT_CLINIC_OR_DEPARTMENT_OTHER)
Admission: EM | Admit: 2022-06-15 | Discharge: 2022-06-15 | Disposition: A | Discharge status: Home / Self Care | Payer: Managed Care, Other (non HMO) | Attending: Emergency Medicine | Admitting: Emergency Medicine

## 2022-06-15 ENCOUNTER — Encounter (HOSPITAL_BASED_OUTPATIENT_CLINIC_OR_DEPARTMENT_OTHER): Payer: Self-pay | Admitting: Emergency Medicine

## 2022-06-15 ENCOUNTER — Other Ambulatory Visit: Payer: Self-pay

## 2022-06-15 ENCOUNTER — Emergency Department (HOSPITAL_BASED_OUTPATIENT_CLINIC_OR_DEPARTMENT_OTHER): Payer: Managed Care, Other (non HMO)

## 2022-06-15 ENCOUNTER — Other Ambulatory Visit (HOSPITAL_BASED_OUTPATIENT_CLINIC_OR_DEPARTMENT_OTHER): Payer: Self-pay

## 2022-06-15 DIAGNOSIS — K529 Noninfective gastroenteritis and colitis, unspecified: Secondary | ICD-10-CM | POA: Diagnosis not present

## 2022-06-15 DIAGNOSIS — R1030 Lower abdominal pain, unspecified: Secondary | ICD-10-CM

## 2022-06-15 DIAGNOSIS — K6289 Other specified diseases of anus and rectum: Secondary | ICD-10-CM | POA: Diagnosis not present

## 2022-06-15 DIAGNOSIS — K5900 Constipation, unspecified: Secondary | ICD-10-CM | POA: Insufficient documentation

## 2022-06-15 DIAGNOSIS — R1031 Right lower quadrant pain: Secondary | ICD-10-CM | POA: Diagnosis present

## 2022-06-15 LAB — LIPASE, BLOOD: Lipase: <10 U/L — ABNORMAL LOW (ref 11–51)

## 2022-06-15 LAB — COMPREHENSIVE METABOLIC PANEL WITH GFR
ALT: 13 U/L (ref 0–44)
AST: 14 U/L — ABNORMAL LOW (ref 15–41)
Albumin: 3.6 g/dL (ref 3.5–5.0)
Alkaline Phosphatase: 35 U/L — ABNORMAL LOW (ref 38–126)
Anion gap: 7 (ref 5–15)
BUN: 9 mg/dL (ref 6–20)
CO2: 32 mmol/L (ref 22–32)
Calcium: 9.2 mg/dL (ref 8.9–10.3)
Chloride: 101 mmol/L (ref 98–111)
Creatinine, Ser: 0.83 mg/dL (ref 0.44–1.00)
GFR, Estimated: >60 mL/min
Glucose, Bld: 104 mg/dL — ABNORMAL HIGH (ref 70–99)
Potassium: 3.8 mmol/L (ref 3.5–5.1)
Sodium: 140 mmol/L (ref 135–145)
Total Bilirubin: 0.6 mg/dL (ref 0.3–1.2)
Total Protein: 6.1 g/dL — ABNORMAL LOW (ref 6.5–8.1)

## 2022-06-15 LAB — URINALYSIS, ROUTINE W REFLEX MICROSCOPIC
Bacteria, UA: NONE SEEN
Bilirubin Urine: NEGATIVE
Glucose, UA: NEGATIVE mg/dL
Ketones, ur: NEGATIVE mg/dL
Leukocytes,Ua: NEGATIVE
Nitrite: NEGATIVE
RBC / HPF: >50 RBC/hpf (ref 0–5)
Specific Gravity, Urine: 1.027 (ref 1.005–1.030)
pH: 7 (ref 5.0–8.0)

## 2022-06-15 LAB — CBC
HCT: 37.8 % (ref 36.0–46.0)
Hemoglobin: 12.4 g/dL (ref 12.0–15.0)
MCH: 27.6 pg (ref 26.0–34.0)
MCHC: 32.8 g/dL (ref 30.0–36.0)
MCV: 84 fL (ref 80.0–100.0)
Platelets: 413 10*3/uL — ABNORMAL HIGH (ref 150–400)
RBC: 4.5 MIL/uL (ref 3.87–5.11)
RDW: 14.2 % (ref 11.5–15.5)
WBC: 9.3 10*3/uL (ref 4.0–10.5)
nRBC: 0 % (ref 0.0–0.2)

## 2022-06-15 LAB — PREGNANCY, URINE: Preg Test, Ur: NEGATIVE

## 2022-06-15 LAB — HEMOGLOBIN AND HEMATOCRIT, BLOOD
HCT: 31.7 % — ABNORMAL LOW (ref 36.0–46.0)
Hemoglobin: 10.4 g/dL — ABNORMAL LOW (ref 12.0–15.0)

## 2022-06-15 MED ORDER — IOHEXOL 300 MG/ML  SOLN
100.0000 mL | Freq: Once | INTRAMUSCULAR | Status: AC | PRN
  Administered 2022-06-15: 80 mL via INTRAVENOUS

## 2022-06-15 MED ORDER — METOCLOPRAMIDE HCL 5 MG/ML IJ SOLN
10.0000 mg | Freq: Once | INTRAMUSCULAR | Status: AC
  Administered 2022-06-15: 10 mg via INTRAVENOUS
  Filled 2022-06-15: qty 2

## 2022-06-15 MED ORDER — GLYCERIN (ADULT) 2 G RE SUPP
1.0000 | RECTAL | 0 refills | Status: AC | PRN
  Filled 2022-06-15: qty 12, fill #0

## 2022-06-15 MED ORDER — SODIUM CHLORIDE 0.9 % IV BOLUS
1000.0000 mL | Freq: Once | INTRAVENOUS | Status: AC
  Administered 2022-06-15: 1000 mL via INTRAVENOUS

## 2022-06-15 MED ORDER — SENNOSIDES-DOCUSATE SODIUM 8.6-50 MG PO TABS
1.0000 | ORAL_TABLET | Freq: Every day | ORAL | 1 refills | Status: AC
  Filled 2022-06-15: qty 15, 15d supply, fill #0

## 2022-06-15 MED ORDER — DICYCLOMINE HCL 10 MG PO CAPS
10.0000 mg | ORAL_CAPSULE | Freq: Once | ORAL | Status: AC
  Administered 2022-06-15: 10 mg via ORAL
  Filled 2022-06-15: qty 1

## 2022-06-15 MED ORDER — AMOXICILLIN-POT CLAVULANATE 875-125 MG PO TABS
1.0000 | ORAL_TABLET | Freq: Two times a day (BID) | ORAL | 0 refills | Status: AC
  Filled 2022-06-15 – 2022-06-16 (×2): qty 14, 7d supply, fill #0

## 2022-06-15 MED ORDER — DOCUSATE SODIUM 250 MG PO CAPS
250.0000 mg | ORAL_CAPSULE | Freq: Every day | ORAL | 0 refills | Status: AC
  Filled 2022-06-15: qty 10, 10d supply, fill #0

## 2022-06-15 NOTE — ED Provider Notes (Signed)
Patient care taken over at shift handoff from outgoing provider.  For full HPI please refer to previous providers note.  Plan is to follow-up with CT abdomen pelvis. Physical Exam  BP 98/61   Pulse 66   Temp 98 F (36.7 C)   Resp 16   Wt 67.1 kg   LMP 05/22/2022   SpO2 100%   BMI 24.63 kg/m   Physical Exam Vitals and nursing note reviewed.  Constitutional:      Appearance: Normal appearance.  HENT:     Head: Normocephalic and atraumatic.     Mouth/Throat:     Mouth: Mucous membranes are moist.  Eyes:     General: No scleral icterus. Cardiovascular:     Rate and Rhythm: Normal rate and regular rhythm.     Pulses: Normal pulses.     Heart sounds: Normal heart sounds.  Pulmonary:     Effort: Pulmonary effort is normal.     Breath sounds: Normal breath sounds.  Abdominal:     General: Abdomen is flat.     Palpations: Abdomen is soft.     Tenderness: There is abdominal tenderness in the right lower quadrant, suprapubic area and left lower quadrant. There is no guarding or rebound.  Musculoskeletal:        General: No deformity.  Skin:    General: Skin is warm.     Findings: No rash.  Neurological:     General: No focal deficit present.     Mental Status: She is alert.  Psychiatric:        Mood and Affect: Mood normal.     Procedures  Procedures  ED Course / MDM   Clinical Course as of 06/15/22 2055  Tue Jun 15, 2022  1533 WBC, UA: 6-10 [JS]    Clinical Course User Index [JS] Claude Manges, PA-C   Medical Decision Making Amount and/or Complexity of Data Reviewed Labs: ordered. Decision-making details documented in ED Course. Radiology: ordered.  Risk OTC drugs. Prescription drug management.   32 year old female past with history of IBS presents with a chief complaint of abdominal pain has been going on for a week.  Workup including CMP with no acute electrolyte abnormality or AKI.  CBC with no evidence of leukocytosis or anemia.  Repeated hemoglobin  showed a hemoglobin of 10.4.  CT abdomen pelvis showed large amount of stool in the colon in addition to colitis/proctitis.  Abdominal exam without peritoneal signs. No evidence of acute abdomen at this time. Well appearing. Presentation not consistent with other acute, emergent causes of abdominal pain at this time.  Prescribed medications for constipation including Colace, senna, glycerin suppository and Augmentin for colitis/proctitis.  Advised patient to follow-up with GI and OB/GYN for further evaluation and management.  Strict return precaution given.  Disposition Continued outpatient therapy. Follow-up with GI and OBGYN recommended for reevaluation of symptoms. Treatment plan discussed with patient.  Pt acknowledged understanding was agreeable to the plan. Worrisome signs and symptoms were discussed with patient, and patient acknowledged understanding to return to the ED if they noticed these signs and symptoms. Patient was stable upon discharge.   This chart was dictated using voice recognition software.  Despite best efforts to proofread,  errors can occur which can change the documentation meaning.         Jeanelle Malling, PA 06/15/22 2102    Mardene Sayer, MD 06/16/22 1125

## 2022-06-15 NOTE — ED Provider Notes (Signed)
[place] EMERGENCY DEPARTMENT AT [facility] Provider Note   CSN: [id number] Arrival date & time: 06/15/22  1232     History IBS Chief Complaint  Patient presents with   Abdominal Pain    Stephanie Young is a 32 y.o. female.  32 y.o female with a PMH of IBS presents to the ED with a chief complaint of abdominal pain along with constipation and blood in her stool which has been ongoing for about 1 week.  Patient reports she was evaluated in the emergency department previously, did have some lower abdominal cramping, has been taking MiraLAX daily, not very often to help with constipation.  Feels like she has not had a full bowel movement since about a week ago.  She has been taking reglan to help with her nausea. She does have vaginal bleeding which is she reports is not her menstrual cycle, but is not concern for sexual transmitted infection as she reports no sexual intercourse. LMP 05/23/2022. No fever, no vaginal discharge, no vomiting.   The history is provided by the patient.  Abdominal Pain Pain location:  Suprapubic, LLQ and RLQ Pain quality: cramping   Pain radiates to:  Does not radiate Pain severity:  Mild Onset quality:  Gradual Duration:  1 week Timing:  Constant Chronicity:  New Associated symptoms: no chest pain, no chills, no fever, no nausea, no shortness of breath, no sore throat and no vomiting        Home Medications Prior to Admission medications   Medication Sig Start Date End Date Taking? Authorizing Provider  amoxicillin-clavulanate (AUGMENTIN) 875-125 MG tablet Take 1 tablet by mouth every 12 (twelve) hours. 06/15/22  Yes Jeanelle Malling, PA  docusate sodium (COLACE) 250 MG capsule Take 1 capsule (250 mg total) by mouth daily. 06/15/22  Yes Jeanelle Malling, PA  glycerin adult 2 g suppository Place 1 suppository rectally as needed for constipation. 06/15/22  Yes Jeanelle Malling, PA  senna-docusate (SENOKOT-S) 8.6-50 MG tablet Take 1 tablet by mouth daily. 06/15/22  Yes Jeanelle Malling, PA  acetaminophen (TYLENOL) 500 MG tablet Take 1,000 mg by mouth every 6 (six) hours as needed for moderate pain.     [provider]  albuterol (PROVENTIL HFA) 108 (90 Base) MCG/ACT inhaler Inhale 2 puffs into the lungs every 6 hours as needed for wheezing and shortness of breath. 04/06/22   Claiborne Rigg, NP  cetirizine (ZYRTEC) 10 MG tablet TAKE 1 TABLET(10 MG) BY MOUTH DAILY 08/29/20   Hoy Register, MD  dicyclomine (BENTYL) 10 MG capsule Take 1 capsule (10 mg total) by mouth 3 (three) times daily before meals. 04/06/22   Claiborne Rigg, NP  fluticasone (FLONASE) 50 MCG/ACT nasal spray Place 1 spray into both nostrils daily. 04/06/22   Claiborne Rigg, NP  ibuprofen (ADVIL) 800 MG tablet Take 1 tablet (800 mg total) by mouth every 8 (eight) hours as needed. 05/10/22   Hoy Register, MD  ketoconazole (NIZORAL) 2 % shampoo Apply 1 application topically 2 (two) times a week. 02/09/21   Claiborne Rigg, NP  meloxicam (MOBIC) 7.5 MG tablet Take 1 tablet (7.5 mg total) by mouth daily. 03/18/20   Claiborne Rigg, NP  metoCLOPramide (REGLAN) 10 MG tablet Take 1 tablet (10 mg total) by mouth every 6 (six) hours as needed for nausea or vomiting. 06/07/22   Clark, Meghan R, PA-C  mirtazapine (REMERON) 30 MG tablet Take 1 tablet (30 mg total) by mouth at bedtime. 04/07/22   Meredeth Ide,  Shea StakesZelda W, NP  Multiple Vitamin (MULTIVITAMIN WITH MINERALS) TABS tablet Take 1 tablet by mouth daily. 11/02/17   Osvaldo ShipperKrishnan, Gokul, MD  nitrofurantoin, macrocrystal-monohydrate, (MACROBID) 100 MG capsule Take 1 capsule (100 mg total) by mouth 2 (two) times daily. 06/07/22   Clark, Meghan R, PA-C  tiZANidine (ZANAFLEX) 4 MG tablet Take 1 tablet (4 mg total) by mouth every 8 (eight) hours as needed for muscle spasms. 06/22/21   Hoy RegisterNewlin, Enobong, MD  triamcinolone ointment (KENALOG) 0.5 % Apply topically 3 (three) times daily. 08/26/21   Claiborne RiggFleming, [name], NP  vitamin B-12 (CYANOCOBALAMIN) 500 MCG tablet Take 500 mcg by mouth  daily.    [provider]  Vitamin D, Ergocalciferol, (DRISDOL) 1.25 MG (50000 UNIT) CAPS capsule Take 1 capsule (50,000 Units total) by mouth every 7 (seven) days. 04/09/22   Claiborne RiggFleming, [name], NP      Allergies    Sulfa antibiotics and Cephalosporins    Review of Systems   Review of Systems  Constitutional:  Negative for chills and fever.  HENT:  Negative for sore throat.   Respiratory:  Negative for shortness of breath.   Cardiovascular:  Negative for chest pain.  Gastrointestinal:  Positive for abdominal pain and blood in stool. Negative for nausea and vomiting.  Genitourinary:  Negative for flank pain.  All other systems reviewed and are negative.   Physical Exam Updated Vital Signs BP 103/69   Pulse 60   Temp 98 F (36.7 C)   Resp 18   Wt 67.1 kg   LMP 05/22/2022   SpO2 94%   BMI 24.63 kg/m  Physical Exam Vitals and nursing note reviewed.  Constitutional:      General: She is not in acute distress.    Appearance: She is well-developed.  HENT:     Head: Normocephalic and atraumatic.     Mouth/Throat:     Pharynx: No oropharyngeal exudate.  Eyes:     Pupils: Pupils are equal, round, and reactive to light.  Cardiovascular:     Rate and Rhythm: Regular rhythm.     Heart sounds: Normal heart sounds.  Pulmonary:     Effort: Pulmonary effort is normal. No respiratory distress.     Breath sounds: Normal breath sounds.  Abdominal:     General: Abdomen is flat. Bowel sounds are normal. There is no distension.     Palpations: Abdomen is soft.     Tenderness: There is abdominal tenderness in the right lower quadrant, suprapubic area and left lower quadrant. There is no right CVA tenderness or left CVA tenderness.     Comments: Minimal tenderness with palpation.  No guarding, no rebound.  Negative McBurney's point.   Musculoskeletal:        General: No tenderness or deformity.     Cervical back: Normal range of motion.     Right lower leg: No edema.     Left  lower leg: No edema.  Skin:    General: Skin is warm and dry.  Neurological:     Mental Status: She is alert and oriented to person, place, and time.     ED Results / Procedures / Treatments   Labs (all labs ordered are listed, but only abnormal results are displayed) Labs Reviewed  LIPASE, BLOOD - Abnormal; Notable for the following components:      Result Value   Lipase <10 (*)    All other components within normal limits  COMPREHENSIVE METABOLIC PANEL - Abnormal; Notable for the following  components:   Glucose, Bld 104 (*)    Total Protein 6.1 (*)    AST 14 (*)    Alkaline Phosphatase 35 (*)    All other components within normal limits  CBC - Abnormal; Notable for the following components:   Platelets 413 (*)    All other components within normal limits  URINALYSIS, ROUTINE W REFLEX MICROSCOPIC - Abnormal; Notable for the following components:   APPearance HAZY (*)    Hgb urine dipstick LARGE (*)    Protein, ur TRACE (*)    All other components within normal limits  HEMOGLOBIN AND HEMATOCRIT, BLOOD - Abnormal; Notable for the following components:   Hemoglobin 10.4 (*)    HCT 31.7 (*)    All other components within normal limits  PREGNANCY, URINE    EKG None  Radiology CT ABDOMEN PELVIS W CONTRAST  Result Date: 06/15/2022 CLINICAL DATA:  Abdominal pain with constipation and blood in stool for 1 week. EXAM: CT ABDOMEN AND PELVIS WITH CONTRAST TECHNIQUE: Multidetector CT imaging of the abdomen and pelvis was performed using the standard protocol following bolus administration of intravenous contrast. RADIATION DOSE REDUCTION: This exam was performed according to the departmental dose-optimization program which includes automated exposure control, adjustment of the mA and/or kV according to patient size and/or use of iterative reconstruction technique. CONTRAST:  9mL OMNIPAQUE IOHEXOL 300 MG/ML  SOLN COMPARISON:  06/07/2022. FINDINGS: Lower chest: Dependent atelectasis is  present at the lung bases. Hepatobiliary: No focal liver abnormality is seen. No gallstones, gallbladder wall thickening, or biliary dilatation. Pancreas: Unremarkable. No pancreatic ductal dilatation or surrounding inflammatory changes. Spleen: Normal in size without focal abnormality. Adrenals/Urinary Tract: The adrenal glands are within normal limits. The kidneys enhance symmetrically. No renal calculus or hydronephrosis. Bladder is unremarkable. Stomach/Bowel: Stomach is within normal limits. Appendix appears normal. Large amount of stool is present in the colon. There is bowel wall thickening involving the sigmoid colon and rectum with surrounding fat stranding. No bowel obstruction, free air or pneumatosis. Vascular/Lymphatic: No significant vascular findings are present. No enlarged abdominal or pelvic lymph nodes. Reproductive: Uterus and bilateral adnexa are unremarkable. Other: A small amount of free fluid is noted in the right adnexa and cul-de-sac, likely physiologic. 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 No acute osseous abnormality. IMPRESSION: Large amount of stool in the colon, compatible with history of constipation. Bowel wall thickening involving the sigmoid colon and rectum with surrounding fat stranding, suggesting colitis/proctitis. Electronically Signed   By: Thornell Sartorius M.D.   On: 06/15/2022 20:17    Procedures Procedures    Medications Ordered in ED Medications  sodium chloride 0.9 % bolus 1,000 mL (0 mLs Intravenous Stopped 06/15/22 1908)  dicyclomine (BENTYL) capsule 10 mg (10 mg Oral Given 06/15/22 1619)  metoCLOPramide (REGLAN) injection 10 mg (10 mg Intravenous Given 06/15/22 1619)  sodium chloride 0.9 % bolus 1,000 mL (0 mLs Intravenous Stopped 06/15/22 1908)  iohexol (OMNIPAQUE) 300 MG/ML solution 100 mL (80 mLs Intravenous Contrast Given 06/15/22 1939)    ED Course/ Medical Decision Making/ A&P Clinical Course as of 06/16/22  1257  Tue Jun 15, 2022  1533 WBC, UA: 6-10 [JS]    Clinical Course User Index [JS] Claude Manges, PA-C                             Medical Decision Making Amount and/or Complexity of Data Reviewed Labs: ordered. Decision-making  details documented in ED Course. Radiology: ordered.  Risk OTC drugs. Prescription drug management.    This patient presents to the ED for concern of abdominal pain , this involves a number of treatment options, and is a complaint that carries with it a high risk of complications and morbidity.  The differential diagnosis includes ectopic, viral illness versus acute on chronic abdominal pain.   Co morbidities: Discussed in HPI   Brief History:  See HPI.   EMR reviewed including pt PMHx, past surgical history and past visits to ER.   See HPI for more details   Lab Tests:  I ordered and independently interpreted labs.  The pertinent results include:    I personally reviewed all laboratory work and imaging. Metabolic panel without any acute abnormality specifically kidney function within normal limits and no significant electrolyte abnormalities. CBC without leukocytosis or significant anemia.   Imaging Studies: CT Abdomen pelvis pending  Medicines ordered:  I ordered medication including bentyl, bolus, reglan  for symptomatic treatment Reevaluation of the patient after these medicines showed that the patient improved I have reviewed the patients home medicines and have made adjustments as needed  Reevaluation:  After the interventions noted above I re-evaluated patient and found that they have :stayed the same   Social Determinants of Health:  The patient's social determinants of health were a factor in the care of this patient   Problem List / ED Course:  Patient here with lower abdominal pain has been ongoing for about 1 week.  She did have a CT of her abdomen and pelvis about a week ago which did not show any acute findings.  She  reports she has not been able to really move her bowels since about a week ago.  She has been taking MiraLAX but not as often as she could, she has been taking Reglan to help with some nausea but has been eating adequately.  On evaluation she does not have any focal tenderness along the lower abdomen, she does not have any vaginal discharge, or concern for pregnancy at this time she reports she is not sexually active.  Her blood work was unremarkable CBC with no leukocytosis, hemoglobin is stable.  CMP with no electrolyte derangement, creatinine levels unremarkable.  LFTs are within normal limits.  Lipase is less than 10.  UA with trace of protein, 6-10 white blood cell count however she does not have any urinary symptoms on today's visit.  She did have 2 episodes of soft blood pressures with a systolic in the 90s, she is alert and oriented x 3 with these episodes occurred reassessed by me.  She was given 2 L bolus to help with symptomatic treatment.  I did discuss with her reCT in her abdomen in order to further evaluate her cause for the hypotension while in the emergency department, she is afebrile.  I do suspect patient will likely be able to be dispositioned home if all is negative.   Dispostion:  Patient care signed out to incoming team pending CT  Portions of this note were generated with Dragon dictation software. Dictation errors may occur despite best attempts at proofreading.   Final Clinical Impression(s) / ED Diagnoses Final diagnoses:  Lower abdominal pain  Constipation, unspecified constipation type  Colitis  Proctitis    Rx / DC Orders ED Discharge Orders          Ordered    glycerin adult 2 g suppository  As needed  06/15/22 2051    docusate sodium (COLACE) 250 MG capsule  Daily        06/15/22 2051    amoxicillin-clavulanate (AUGMENTIN) 875-125 MG tablet  Every 12 hours        06/15/22 2051    senna-docusate (SENOKOT-S) 8.6-50 MG tablet  Daily        06/15/22 2053               Claude Manges, PA-C 06/16/22 1257    Mardene Sayer, MD 06/16/22 [phone]

## 2022-06-15 NOTE — Discharge Instructions (Addendum)
Please follow up with your primary care doctor within 2-3 days. For constipation we also recommend a diet high in fiber (beans, fruits, vegetables, whole grains). Take Colace 100-200 mg up to three times per day. You may take along with Senokot 1-2 tabs, ingest with full glass of water.  You may also take MiraLAX 1-2 capfuls 1-2 times a day until stools become soft and then slowly decrease the amount of MiraLAX used.  Maintain fluid intake 6-8 glasses per day. Please increase fibers in your diet. You may also take Milk of Magnesia 30 mL as needed for constipation, you may repeat in 2 hours again if no bowl movement.  Follow-up with GI and OB/GYN as directed.

## 2022-06-15 NOTE — ED Triage Notes (Signed)
Pt arrives pov, steady gait, c/o ABD pain with constipation and blood in stool x 1 week. Recently tx for same. Also endorses vaginal bleeding, states "not time for my period.". Pt drinking soda and eating crackers in WR. Endorses reglan at 0700

## 2022-06-16 ENCOUNTER — Other Ambulatory Visit (HOSPITAL_BASED_OUTPATIENT_CLINIC_OR_DEPARTMENT_OTHER): Payer: Self-pay

## 2022-06-23 ENCOUNTER — Encounter: Payer: Self-pay | Admitting: Nurse Practitioner

## 2022-07-03 ENCOUNTER — Other Ambulatory Visit: Payer: Self-pay | Admitting: Family Medicine

## 2022-07-03 DIAGNOSIS — G8929 Other chronic pain: Secondary | ICD-10-CM

## 2022-07-05 ENCOUNTER — Other Ambulatory Visit: Payer: Self-pay

## 2022-07-05 ENCOUNTER — Other Ambulatory Visit (HOSPITAL_COMMUNITY): Payer: Self-pay

## 2022-07-05 ENCOUNTER — Encounter (HOSPITAL_COMMUNITY): Payer: Self-pay

## 2022-07-05 MED ORDER — TIZANIDINE HCL 4 MG PO TABS
4.0000 mg | ORAL_TABLET | Freq: Three times a day (TID) | ORAL | 0 refills | Status: DC | PRN
Start: 2022-07-05 — End: 2022-10-11
  Filled 2022-07-05: qty 30, 10d supply, fill #0

## 2022-07-05 NOTE — Telephone Encounter (Signed)
Requested medication (s) are due for refill today: yes  Requested medication (s) are on the active medication list: yes  Last refill:  06/22/21  Future visit scheduled: yes  Notes to clinic:  Unable to refill per protocol, cannot delegate.      Requested Prescriptions  Pending Prescriptions Disp Refills   tiZANidine (ZANAFLEX) 4 MG tablet 30 tablet 0    Sig: Take 1 tablet (4 mg total) by mouth every 8 (eight) hours as needed for muscle spasms.     Not Delegated - Cardiovascular:  Alpha-2 Agonists - tizanidine Failed - 07/03/2022 10:04 PM      Failed - This refill cannot be delegated      Passed - Valid encounter within last 6 months    Recent Outpatient Visits           3 months ago Encounter for annual physical exam   [place] [facility] [place], [place] W, NP   1 year ago Chronic right-sided low back pain with right-sided sciatica   [place] Claiborne Rigg, NP   1 year ago Abdominal cramping   [place] [facility] [place], [place], [place]   3 years ago Chronic right-sided low back pain with right-sided sciatica   [facility] [place], [place] W, NP   4 years ago Chronic midline low back pain with right-sided sciatica   [facility] Claiborne Rigg, NP       Future Appointments             In 2 weeks Claiborne Rigg, NP [facility]

## 2022-07-06 ENCOUNTER — Encounter: Payer: Self-pay | Admitting: Pharmacist

## 2022-07-06 ENCOUNTER — Other Ambulatory Visit: Payer: Self-pay

## 2022-07-06 ENCOUNTER — Other Ambulatory Visit (HOSPITAL_COMMUNITY): Payer: Self-pay

## 2022-07-20 ENCOUNTER — Ambulatory Visit: Payer: Self-pay | Admitting: Nurse Practitioner

## 2022-07-29 ENCOUNTER — Telehealth: Payer: Self-pay

## 2022-07-29 NOTE — Telephone Encounter (Signed)
Shot record has been mailed to patient    Copied from CRM [id number]. Topic: General - Other >> Jul 28, 2022  4:34 PM Ja-Kwan M wrote: Reason for CRM: Pt requests that a copy of her tdap proving that it was done be mailed to her home address. Pt address verified.

## 2022-08-04 ENCOUNTER — Other Ambulatory Visit: Payer: Self-pay | Admitting: Family Medicine

## 2022-08-04 ENCOUNTER — Other Ambulatory Visit: Payer: Self-pay | Admitting: Nurse Practitioner

## 2022-08-04 DIAGNOSIS — G8929 Other chronic pain: Secondary | ICD-10-CM

## 2022-08-04 DIAGNOSIS — N946 Dysmenorrhea, unspecified: Secondary | ICD-10-CM

## 2022-08-06 ENCOUNTER — Other Ambulatory Visit (HOSPITAL_COMMUNITY): Payer: Self-pay

## 2022-08-10 ENCOUNTER — Ambulatory Visit: Payer: Managed Care, Other (non HMO) | Admitting: Nurse Practitioner

## 2022-08-30 ENCOUNTER — Other Ambulatory Visit: Payer: Self-pay | Admitting: Family Medicine

## 2022-08-30 DIAGNOSIS — N946 Dysmenorrhea, unspecified: Secondary | ICD-10-CM

## 2022-08-30 DIAGNOSIS — G8929 Other chronic pain: Secondary | ICD-10-CM

## 2022-08-31 NOTE — Telephone Encounter (Signed)
Requested medication (s) are due for refill today: na  Requested medication (s) are on the active medication list: yes   Last refill:  ibuprofen- 05/10/22 #60 0 refills, zanaflex- 07/05/22 #30 0 refills  Future visit scheduled: no   Notes to clinic:   last OV 4 months ago. Last labs 06/15/22 no refills remain. Not delegated per protocol do you want to refill Rx?     Requested Prescriptions  Pending Prescriptions Disp Refills   ibuprofen (ADVIL) 800 MG tablet 60 tablet 0    Sig: Take 1 tablet (800 mg total) by mouth every 8 (eight) hours as needed.     Analgesics:  NSAIDS Failed - 08/30/2022  4:03 PM      Failed - Manual Review: Labs are only required if the patient has taken medication for more than 8 weeks.      Failed - HGB in normal range and within 360 days    Hemoglobin  Date Value Ref Range Status  06/15/2022 10.4 (L) 12.0 - 15.0 g/dL Final  21/30/8657 84.6 11.1 - 15.9 g/dL Final         Failed - PLT in normal range and within 360 days    Platelets  Date Value Ref Range Status  06/15/2022 413 (H) 150 - 400 K/uL Final  04/06/2022 474 (H) 150 - 450 x10E3/uL Final         Failed - HCT in normal range and within 360 days    HCT  Date Value Ref Range Status  06/15/2022 31.7 (L) 36.0 - 46.0 % Final    Comment:    Performed at [facility], [address] [place], [place] [address]   Hematocrit  Date Value Ref Range Status  04/06/2022 41.3 34.0 - 46.6 % Final         Passed - Cr in normal range and within 360 days    Creatinine, Ser  Date Value Ref Range Status  06/15/2022 0.83 0.44 - 1.00 mg/dL Final         Passed - eGFR is 30 or above and within 360 days    GFR calc Af Amer  Date Value Ref Range Status  11/30/2019 122 >59 mL/min/1.73 Final    Comment:    **[facility] currently reports eGFR in compliance with the current**   recommendations of the [employer]. [facility] will   update reporting as new guidelines are published from  the NKF-ASN   Task force.    GFR, Estimated  Date Value Ref Range Status  06/15/2022 >60 >60 mL/min Final    Comment:    (NOTE) Calculated using the CKD-EPI Creatinine Equation (2021)    eGFR  Date Value Ref Range Status  04/06/2022 100 >59 mL/min/1.73 Final         Passed - Patient is not pregnant      Passed - Valid encounter within last 12 months    Recent Outpatient Visits           4 months ago Encounter for annual physical exam   [place] [facility] [place], [place] W, NP   1 year ago Chronic right-sided low back pain with right-sided sciatica   [facility] Claiborne Rigg, NP   2 years ago Abdominal cramping   [facility] [place], [place], [place]   3 years ago Chronic right-sided low back pain with right-sided sciatica   [facility] [place], [place] W, NP   4 years ago Chronic midline low back pain with right-sided sciatica   [place] [facility] [place], [place] W, NP               tiZANidine (ZANAFLEX) 4 MG tablet 30 tablet 0    Sig: Take 1 tablet (4 mg total) by mouth every 8 (eight) hours as needed for muscle spasms.     Not Delegated - Cardiovascular:  Alpha-2 Agonists - tizanidine Failed - 08/30/2022  4:03 PM      Failed - This refill cannot be delegated      Passed - Valid encounter within last 6 months    Recent Outpatient Visits           4 months ago Encounter for annual physical exam   [facility] [place], [place] W, NP   1 year ago Chronic right-sided low back pain with right-sided sciatica   [place] [facility] Claiborne Rigg, NP   2 years ago Abdominal cramping    [facility] [place], [place], [place]   3 years ago Chronic right-sided low back pain with right-sided sciatica   [facility] [place], [place] W, NP   4 years ago Chronic midline low back pain with right-sided sciatica   [facility] [place], Shea Stakes, NP

## 2022-09-02 ENCOUNTER — Other Ambulatory Visit (HOSPITAL_COMMUNITY): Payer: Self-pay

## 2022-09-29 ENCOUNTER — Other Ambulatory Visit: Payer: Self-pay | Admitting: Nurse Practitioner

## 2022-09-29 ENCOUNTER — Other Ambulatory Visit (HOSPITAL_COMMUNITY): Payer: Self-pay

## 2022-09-29 ENCOUNTER — Other Ambulatory Visit (HOSPITAL_BASED_OUTPATIENT_CLINIC_OR_DEPARTMENT_OTHER): Payer: Self-pay

## 2022-09-29 MED ORDER — MIRTAZAPINE 30 MG PO TABS
30.0000 mg | ORAL_TABLET | Freq: Every day | ORAL | 0 refills | Status: DC
  Filled 2022-09-29 (×2): qty 30, 30d supply, fill #0

## 2022-10-11 ENCOUNTER — Ambulatory Visit: Payer: 59 | Attending: Nurse Practitioner | Admitting: Nurse Practitioner

## 2022-10-11 ENCOUNTER — Other Ambulatory Visit (HOSPITAL_BASED_OUTPATIENT_CLINIC_OR_DEPARTMENT_OTHER): Payer: Self-pay

## 2022-10-11 ENCOUNTER — Encounter: Payer: Self-pay | Admitting: Nurse Practitioner

## 2022-10-11 ENCOUNTER — Other Ambulatory Visit (HOSPITAL_COMMUNITY)
Admission: RE | Admit: 2022-10-11 | Discharge: 2022-10-11 | Disposition: A | Discharge status: Home / Self Care | Payer: 59 | Source: Ambulatory Visit | Attending: Nurse Practitioner | Admitting: Nurse Practitioner

## 2022-10-11 ENCOUNTER — Other Ambulatory Visit: Payer: Self-pay

## 2022-10-11 VITALS — BP 107/66 | HR 75 | Ht 65.0 in | Wt 150.0 lb

## 2022-10-11 DIAGNOSIS — Z124 Encounter for screening for malignant neoplasm of cervix: Secondary | ICD-10-CM | POA: Insufficient documentation

## 2022-10-11 DIAGNOSIS — R109 Unspecified abdominal pain: Secondary | ICD-10-CM

## 2022-10-11 DIAGNOSIS — N946 Dysmenorrhea, unspecified: Secondary | ICD-10-CM

## 2022-10-11 DIAGNOSIS — M5441 Lumbago with sciatica, right side: Secondary | ICD-10-CM

## 2022-10-11 DIAGNOSIS — G8929 Other chronic pain: Secondary | ICD-10-CM

## 2022-10-11 DIAGNOSIS — Z3141 Encounter for fertility testing: Secondary | ICD-10-CM

## 2022-10-11 MED ORDER — IBUPROFEN 800 MG PO TABS
800.0000 mg | ORAL_TABLET | Freq: Three times a day (TID) | ORAL | 1 refills | Status: DC | PRN
  Filled 2022-10-11: qty 60, 20d supply, fill #0
  Filled 2022-11-10: qty 60, 20d supply, fill #1

## 2022-10-11 MED ORDER — TRIAMCINOLONE ACETONIDE 0.5 % EX OINT
TOPICAL_OINTMENT | Freq: Three times a day (TID) | CUTANEOUS | 1 refills | Status: DC
  Filled 2022-10-11 (×2): qty 30, 15d supply, fill #0
  Filled 2022-11-10: qty 60, 30d supply, fill #1

## 2022-10-11 MED ORDER — TIZANIDINE HCL 4 MG PO TABS
4.0000 mg | ORAL_TABLET | Freq: Three times a day (TID) | ORAL | 3 refills | Status: DC | PRN
Start: 2022-10-11 — End: 2023-02-19
  Filled 2022-10-11: qty 30, 10d supply, fill #0
  Filled 2022-11-10: qty 30, 10d supply, fill #1
  Filled 2022-12-10: qty 30, 10d supply, fill #2
  Filled 2023-01-19: qty 30, 10d supply, fill #3

## 2022-10-11 MED ORDER — DICYCLOMINE HCL 10 MG PO CAPS
10.0000 mg | ORAL_CAPSULE | Freq: Three times a day (TID) | ORAL | 1 refills | Status: DC
  Filled 2022-10-11: qty 90, 30d supply, fill #0

## 2022-10-11 NOTE — Progress Notes (Signed)
Assessment & Plan:  Stephanie Young was seen today for gynecologic exam.  Diagnoses and all orders for this visit:  Encounter for Papanicolaou smear of cervix -     Cytology - PAP -     Cervicovaginal ancillary only  Menstrual cramps -     ibuprofen (ADVIL) 800 MG tablet; Take 1 tablet (800 mg total) by mouth every 8 (eight) hours as needed.  Chronic right-sided low back pain with right-sided sciatica Well controlled -     tiZANidine (ZANAFLEX) 4 MG tablet; Take 1 tablet (4 mg total) by mouth every 8 (eight) hours as needed for muscle spasms.  Abdominal cramping -     dicyclomine (BENTYL) 10 MG capsule; Take 1 capsule (10 mg total) by mouth 3 (three) times daily before meals.  Other orders -     triamcinolone ointment (KENALOG) 0.5 %; Apply topically 3 (three) times daily.    Patient has been counseled on age-appropriate routine health concerns for screening and prevention. These are reviewed and up-to-date. Referrals have been placed accordingly. Immunizations are up-to-date or declined.    Subjective:   Chief Complaint  Patient presents with   Gynecologic Exam   Gynecologic Exam Pertinent negatives include no abdominal pain, chills, fever, flank pain or rash.   Stephanie Young 32 y.o. female presents to office today for well woman exam.  She is requesting a referral to gynecology for fertility testing.  Overall she is doing well today.  Has no other questions or concerns.   Review of Systems  Constitutional: Negative.  Negative for chills, fever, malaise/fatigue and weight loss.  Respiratory: Negative.  Negative for cough, shortness of breath and wheezing.   Cardiovascular: Negative.  Negative for chest pain, orthopnea and leg swelling.  Gastrointestinal:  Negative for abdominal pain.  Genitourinary: Negative.  Negative for flank pain.  Skin: Negative.  Negative for rash.  Psychiatric/Behavioral:  Negative for suicidal ideas.     Past Medical History:  Diagnosis Date    Anemia    H. pylori infection    History of kidney stones     Past Surgical History:  Procedure Laterality Date   dental procedure     FLEXIBLE SIGMOIDOSCOPY N/A 01/30/2018   Procedure: FLEXIBLE SIGMOIDOSCOPY WITH PROPOFOL;  Surgeon: Corbin Ade, MD;  Location: [facility];  Service: Endoscopy;  Laterality: N/A;  1:45pm    Family History  Problem Relation Age of Onset   Breast cancer Mother 37   Hypertension Mother    Hypertension Brother    Diabetes Maternal Uncle    Diabetes Other    Colon cancer Neg Hx    Colon polyps Neg Hx     Social History Reviewed with no changes to be made today.   Outpatient Medications Prior to Visit  Medication Sig Dispense Refill   acetaminophen (TYLENOL) 500 MG tablet Take 1,000 mg by mouth every 6 (six) hours as needed for moderate pain.      albuterol (PROVENTIL HFA) 108 (90 Base) MCG/ACT inhaler Inhale 2 puffs into the lungs every 6 hours as needed for wheezing and shortness of breath. 6.7 g 0   cetirizine (ZYRTEC) 10 MG tablet TAKE 1 TABLET(10 MG) BY MOUTH DAILY 30 tablet 2   docusate sodium (COLACE) 250 MG capsule Take 1 capsule (250 mg total) by mouth daily. 10 capsule 0   fluticasone (FLONASE) 50 MCG/ACT nasal spray Place 1 spray into both nostrils daily. 16 g 1   meloxicam (MOBIC) 7.5 MG tablet Take 1  tablet (7.5 mg total) by mouth daily. 30 tablet 0   mirtazapine (REMERON) 30 MG tablet Take 1 tablet (30 mg total) by mouth at bedtime. 30 tablet 0   Multiple Vitamin (MULTIVITAMIN WITH MINERALS) TABS tablet Take 1 tablet by mouth daily. 30 tablet 0   senna-docusate (SENOKOT-S) 8.6-50 MG tablet Take 1 tablet by mouth daily. 15 tablet 1   Vitamin D, Ergocalciferol, (DRISDOL) 1.25 MG (50000 UNIT) CAPS capsule Take 1 capsule (50,000 Units total) by mouth every 7 (seven) days. 12 capsule 0   dicyclomine (BENTYL) 10 MG capsule Take 1 capsule (10 mg total) by mouth 3 (three) times daily before meals. 90 capsule 1   ibuprofen (ADVIL) 800 MG  tablet Take 1 tablet (800 mg total) by mouth every 8 (eight) hours as needed. 60 tablet 0   tiZANidine (ZANAFLEX) 4 MG tablet Take 1 tablet (4 mg total) by mouth every 8 (eight) hours as needed for muscle spasms. 30 tablet 0   triamcinolone ointment (KENALOG) 0.5 % Apply topically 3 (three) times daily. 60 g 1   amoxicillin-clavulanate (AUGMENTIN) 875-125 MG tablet Take 1 tablet by mouth every 12 (twelve) hours. (Patient not taking: Reported on 10/11/2022) 14 tablet 0   glycerin adult 2 g suppository Place 1 suppository rectally as needed for constipation. (Patient not taking: Reported on 10/11/2022) 12 suppository 0   ketoconazole (NIZORAL) 2 % shampoo Apply 1 application topically 2 (two) times a week. (Patient not taking: Reported on 10/11/2022) 120 mL 1   metoCLOPramide (REGLAN) 10 MG tablet Take 1 tablet (10 mg total) by mouth every 6 (six) hours as needed for nausea or vomiting. (Patient not taking: Reported on 10/11/2022) 20 tablet 0   nitrofurantoin, macrocrystal-monohydrate, (MACROBID) 100 MG capsule Take 1 capsule (100 mg total) by mouth 2 (two) times daily. (Patient not taking: Reported on 10/11/2022) 10 capsule 0   vitamin B-12 (CYANOCOBALAMIN) 500 MCG tablet Take 500 mcg by mouth daily. (Patient not taking: Reported on 10/11/2022)     No facility-administered medications prior to visit.    Allergies  Allergen Reactions   Sulfa Antibiotics Hives   Cephalosporins Rash    Cefepime       Objective:    BP 107/66 (BP Location: Right Arm, Patient Position: Sitting, Cuff Size: Normal)   Pulse 75   Ht 5\' 5"  (1.651 m)   Wt 150 lb (68 kg)   LMP 09/19/2022   SpO2 100%   BMI 24.96 kg/m  Wt Readings from Last 3 Encounters:  10/11/22 150 lb (68 kg)  06/15/22 148 lb (67.1 kg)  06/07/22 146 lb (66.2 kg)    Physical Exam Exam conducted with a chaperone present.  Constitutional:      Appearance: She is well-developed.  HENT:     Head: Normocephalic.  Cardiovascular:     Rate and Rhythm:  Normal rate and regular rhythm.     Heart sounds: Normal heart sounds.  Pulmonary:     Effort: Pulmonary effort is normal.     Breath sounds: Normal breath sounds.  Abdominal:     General: Bowel sounds are normal.     Palpations: Abdomen is soft.     Hernia: There is no hernia in the left inguinal area.  Genitourinary:    Exam position: Lithotomy position.     Labia:        Right: No rash, tenderness, lesion or injury.        Left: No rash, tenderness, lesion or injury.  Vagina: Normal. No signs of injury and foreign body. No vaginal discharge, erythema, tenderness or bleeding.     Cervix: Normal.     Uterus: Deviated (right side). Not enlarged.      Adnexa:        Right: No mass, tenderness or fullness.         Left: No mass, tenderness or fullness.       Rectum: Normal. No external hemorrhoid.  Lymphadenopathy:     Lower Body: No right inguinal adenopathy. No left inguinal adenopathy.  Skin:    General: Skin is warm and dry.  Neurological:     Mental Status: She is alert and oriented to person, place, and time.  Psychiatric:        Behavior: Behavior normal.        Thought Content: Thought content normal.        Judgment: Judgment normal.          Patient has been counseled extensively about nutrition and exercise as well as the importance of adherence with medications and regular follow-up. The patient was given clear instructions to go to ER or return to medical center if symptoms don't improve, worsen or new problems develop. The patient verbalized understanding.   Follow-up: No follow-ups on file.   Claiborne Rigg, FNP-BC [facility] and [facility] [place], [place] [phone]   10/11/2022, 10:11 AM

## 2022-10-13 ENCOUNTER — Encounter: Payer: Self-pay | Admitting: Nurse Practitioner

## 2022-11-01 ENCOUNTER — Encounter: Payer: Self-pay | Admitting: Nurse Practitioner

## 2022-11-02 ENCOUNTER — Other Ambulatory Visit: Payer: Self-pay | Admitting: Nurse Practitioner

## 2022-11-02 ENCOUNTER — Other Ambulatory Visit: Payer: Self-pay

## 2022-11-02 ENCOUNTER — Other Ambulatory Visit (HOSPITAL_COMMUNITY): Payer: Self-pay

## 2022-11-02 DIAGNOSIS — F5101 Primary insomnia: Secondary | ICD-10-CM

## 2022-11-02 MED ORDER — TRAZODONE HCL 50 MG PO TABS
25.0000 mg | ORAL_TABLET | Freq: Every evening | ORAL | 3 refills | Status: DC | PRN
Start: 2022-11-02 — End: 2022-12-16
  Filled 2022-11-02 (×2): qty 30, 30d supply, fill #0
  Filled 2022-12-07: qty 30, 30d supply, fill #1

## 2022-11-03 ENCOUNTER — Ambulatory Visit: Payer: Managed Care, Other (non HMO) | Admitting: Nurse Practitioner

## 2022-11-10 ENCOUNTER — Other Ambulatory Visit (HOSPITAL_COMMUNITY): Payer: Self-pay

## 2022-11-11 ENCOUNTER — Other Ambulatory Visit: Payer: Self-pay

## 2022-11-13 ENCOUNTER — Other Ambulatory Visit (HOSPITAL_COMMUNITY): Payer: Self-pay

## 2022-12-06 ENCOUNTER — Other Ambulatory Visit (HOSPITAL_COMMUNITY): Payer: Self-pay

## 2022-12-07 ENCOUNTER — Other Ambulatory Visit (HOSPITAL_BASED_OUTPATIENT_CLINIC_OR_DEPARTMENT_OTHER): Payer: Self-pay

## 2022-12-07 ENCOUNTER — Other Ambulatory Visit: Payer: Self-pay

## 2022-12-07 ENCOUNTER — Encounter: Payer: Self-pay | Admitting: Nurse Practitioner

## 2022-12-07 DIAGNOSIS — N946 Dysmenorrhea, unspecified: Secondary | ICD-10-CM

## 2022-12-07 DIAGNOSIS — G8929 Other chronic pain: Secondary | ICD-10-CM

## 2022-12-08 ENCOUNTER — Other Ambulatory Visit: Payer: Self-pay

## 2022-12-08 ENCOUNTER — Other Ambulatory Visit (HOSPITAL_BASED_OUTPATIENT_CLINIC_OR_DEPARTMENT_OTHER): Payer: Self-pay

## 2022-12-10 ENCOUNTER — Other Ambulatory Visit (HOSPITAL_COMMUNITY): Payer: Self-pay

## 2022-12-10 ENCOUNTER — Other Ambulatory Visit: Payer: Self-pay

## 2022-12-10 ENCOUNTER — Encounter (HOSPITAL_COMMUNITY): Payer: Self-pay

## 2022-12-10 ENCOUNTER — Other Ambulatory Visit: Payer: Self-pay | Admitting: Nurse Practitioner

## 2022-12-10 DIAGNOSIS — N946 Dysmenorrhea, unspecified: Secondary | ICD-10-CM

## 2022-12-10 MED ORDER — TRIAMCINOLONE ACETONIDE 0.5 % EX OINT
TOPICAL_OINTMENT | Freq: Three times a day (TID) | CUTANEOUS | 1 refills | Status: DC
  Filled 2022-12-10: qty 60, 30d supply, fill #0
  Filled 2023-01-19: qty 60, 30d supply, fill #1

## 2022-12-10 MED ORDER — IBUPROFEN 800 MG PO TABS
800.0000 mg | ORAL_TABLET | Freq: Three times a day (TID) | ORAL | 1 refills | Status: DC | PRN
Start: 2022-12-10 — End: 2023-02-19
  Filled 2022-12-10: qty 60, 20d supply, fill #0
  Filled 2023-01-15: qty 60, 20d supply, fill #1

## 2022-12-11 ENCOUNTER — Other Ambulatory Visit (HOSPITAL_COMMUNITY): Payer: Self-pay

## 2022-12-16 ENCOUNTER — Other Ambulatory Visit: Payer: Self-pay | Admitting: Nurse Practitioner

## 2022-12-16 ENCOUNTER — Encounter: Payer: Self-pay | Admitting: Nurse Practitioner

## 2022-12-16 DIAGNOSIS — F5101 Primary insomnia: Secondary | ICD-10-CM

## 2022-12-16 MED ORDER — TRAZODONE HCL 150 MG PO TABS
75.0000 mg | ORAL_TABLET | Freq: Every evening | ORAL | 1 refills | Status: AC | PRN
  Filled 2022-12-16 – 2023-01-01 (×2): qty 30, 60d supply, fill #0
  Filled 2023-02-19: qty 30, 60d supply, fill #1

## 2022-12-17 ENCOUNTER — Other Ambulatory Visit (HOSPITAL_COMMUNITY): Payer: Self-pay

## 2022-12-17 ENCOUNTER — Other Ambulatory Visit: Payer: Self-pay

## 2023-01-03 ENCOUNTER — Other Ambulatory Visit (HOSPITAL_COMMUNITY): Payer: Self-pay

## 2023-01-03 ENCOUNTER — Other Ambulatory Visit: Payer: Self-pay

## 2023-01-15 ENCOUNTER — Other Ambulatory Visit (HOSPITAL_COMMUNITY): Payer: Self-pay

## 2023-01-19 ENCOUNTER — Other Ambulatory Visit: Payer: Self-pay

## 2023-01-27 ENCOUNTER — Encounter: Payer: Self-pay | Admitting: Nurse Practitioner

## 2023-01-29 ENCOUNTER — Other Ambulatory Visit: Payer: Self-pay | Admitting: Nurse Practitioner

## 2023-01-29 DIAGNOSIS — R21 Rash and other nonspecific skin eruption: Secondary | ICD-10-CM

## 2023-01-29 MED ORDER — HYDROXYZINE HCL 10 MG PO TABS
10.0000 mg | ORAL_TABLET | Freq: Three times a day (TID) | ORAL | 0 refills | Status: AC | PRN
Start: 2023-01-29 — End: ?

## 2023-01-29 MED ORDER — PREDNISONE 20 MG PO TABS
20.0000 mg | ORAL_TABLET | Freq: Every day | ORAL | 0 refills | Status: AC
Start: 2023-01-29 — End: 2023-02-03

## 2023-02-06 ENCOUNTER — Other Ambulatory Visit (HOSPITAL_COMMUNITY): Payer: Self-pay

## 2023-02-19 ENCOUNTER — Other Ambulatory Visit (HOSPITAL_COMMUNITY): Payer: Self-pay

## 2023-02-19 ENCOUNTER — Other Ambulatory Visit: Payer: Self-pay | Admitting: Family Medicine

## 2023-02-19 ENCOUNTER — Other Ambulatory Visit: Payer: Self-pay | Admitting: Nurse Practitioner

## 2023-02-19 DIAGNOSIS — N946 Dysmenorrhea, unspecified: Secondary | ICD-10-CM

## 2023-02-19 DIAGNOSIS — G8929 Other chronic pain: Secondary | ICD-10-CM

## 2023-02-21 ENCOUNTER — Other Ambulatory Visit: Payer: Self-pay

## 2023-02-21 ENCOUNTER — Other Ambulatory Visit (HOSPITAL_COMMUNITY): Payer: Self-pay

## 2023-02-21 MED ORDER — IBUPROFEN 800 MG PO TABS
800.0000 mg | ORAL_TABLET | Freq: Three times a day (TID) | ORAL | 1 refills | Status: AC | PRN
  Filled 2023-02-21: qty 60, 20d supply, fill #0
  Filled 2023-03-26 – 2023-04-04 (×4): qty 60, 20d supply, fill #1

## 2023-02-21 MED ORDER — TIZANIDINE HCL 4 MG PO TABS
4.0000 mg | ORAL_TABLET | Freq: Three times a day (TID) | ORAL | 0 refills | Status: DC | PRN
  Filled 2023-02-21: qty 30, 10d supply, fill #0

## 2023-03-26 ENCOUNTER — Other Ambulatory Visit: Payer: Self-pay | Admitting: Family Medicine

## 2023-03-26 ENCOUNTER — Other Ambulatory Visit: Payer: Self-pay | Admitting: Nurse Practitioner

## 2023-03-26 ENCOUNTER — Other Ambulatory Visit (HOSPITAL_COMMUNITY): Payer: Self-pay

## 2023-03-26 DIAGNOSIS — F5101 Primary insomnia: Secondary | ICD-10-CM

## 2023-03-26 DIAGNOSIS — G8929 Other chronic pain: Secondary | ICD-10-CM

## 2023-03-27 ENCOUNTER — Other Ambulatory Visit: Payer: Self-pay

## 2023-03-28 NOTE — Telephone Encounter (Signed)
Requested medication (s) are due for refill today: routing for review  Requested medication (s) are on the active medication list: yes  Last refill:  12/10/22  Future visit scheduled: no  Notes to clinic:  Unable to refill per protocol, appointment needed.      Requested Prescriptions  Pending Prescriptions Disp Refills   traZODone (DESYREL) 150 MG tablet 30 tablet 1    Sig: Take 0.5 tablets (75 mg total) by mouth at bedtime as needed for sleep.     Psychiatry: Antidepressants - Serotonin Modulator Passed - 03/28/2023  9:13 AM      Passed - Valid encounter within last 6 months    Recent Outpatient Visits           5 months ago Encounter for Papanicolaou smear of cervix   [facility] [facility] - [facility] Dept Of [place]. [facility] Claiborne Rigg, NP   11 months ago Encounter for annual physical exam   [place] [facility] [place] - [facility] Dept Of [place]. [facility] [place], [place] W, NP   2 years ago Chronic right-sided low back pain with right-sided sciatica   [place] [facility] - [facility] Dept Of [place]. [facility] Claiborne Rigg, NP   2 years ago Abdominal cramping   [place] [facility] [place] - A Dept Of [place]. [facility] [place], [place], [place]   4 years ago Chronic right-sided low back pain with right-sided sciatica   [place] [facility] - A Dept Of [place]. [facility] Claiborne Rigg, [place]

## 2023-03-28 NOTE — Telephone Encounter (Signed)
Requested medication (s) are due for refill today: routing for review  Requested medication (s) are on the active medication list: yes  Last refill:  12/10/22  Future visit scheduled: no  Notes to clinic:  Unable to refill per protocol, cannot delegate.      Requested Prescriptions  Pending Prescriptions Disp Refills   triamcinolone ointment (KENALOG) 0.5 % 60 g 1    Sig: Apply topically 3 (three) times daily.     Not Delegated - Dermatology:  Corticosteroids Failed - 03/28/2023  9:12 AM      Failed - This refill cannot be delegated      Passed - Valid encounter within last 12 months    Recent Outpatient Visits           5 months ago Encounter for Papanicolaou smear of cervix   [place] [facility] - A Dept Of [place]. [facility] Claiborne Rigg, NP   11 months ago Encounter for annual physical exam   [facility] [facility] [place] - [facility] Dept Of [place]. [facility] [place], [place] W, NP   2 years ago Chronic right-sided low back pain with right-sided sciatica   [place] [facility] - A Dept Of [place]. [facility] Claiborne Rigg, NP   2 years ago Abdominal cramping   [place] [place] [facility] [place] - A Dept Of [place]. [facility] [place], [place], [place]   4 years ago Chronic right-sided low back pain with right-sided sciatica   [place] [facility] - A Dept Of [place]. [facility] [place], [place] W, NP               tiZANidine (ZANAFLEX) 4 MG tablet 30 tablet 0    Sig: Take 1 tablet (4 mg total) by mouth every 8 (eight) hours as needed for muscle spasms.     Not Delegated - Cardiovascular:  Alpha-2 Agonists - tizanidine Failed - 03/28/2023  9:12 AM      Failed - This refill cannot be delegated      Passed - Valid encounter within last 6 months    Recent Outpatient Visits           5 months ago Encounter for Papanicolaou smear of cervix   [place] [facility] [place] - [facility]  Dept Of [place]. [facility] Claiborne Rigg, NP   11 months ago Encounter for annual physical exam   [place] [facility] [place] - A Dept Of [place]. [facility] [place], [place] W, NP   2 years ago Chronic right-sided low back pain with right-sided sciatica   [place] [facility] - A Dept Of [place]. [facility] Claiborne Rigg, NP   2 years ago Abdominal cramping   [place] [facility] [place] - A Dept Of [place]. [facility] [place], [place], [place]   4 years ago Chronic right-sided low back pain with right-sided sciatica   [place] [facility] - A Dept Of . [facility] Claiborne Rigg, [place]

## 2023-04-01 ENCOUNTER — Other Ambulatory Visit (HOSPITAL_COMMUNITY): Payer: Self-pay

## 2023-04-01 ENCOUNTER — Other Ambulatory Visit (HOSPITAL_BASED_OUTPATIENT_CLINIC_OR_DEPARTMENT_OTHER): Payer: Self-pay

## 2023-04-01 ENCOUNTER — Encounter: Payer: Self-pay | Admitting: Nurse Practitioner

## 2023-04-02 ENCOUNTER — Other Ambulatory Visit (HOSPITAL_BASED_OUTPATIENT_CLINIC_OR_DEPARTMENT_OTHER): Payer: Self-pay

## 2023-04-04 ENCOUNTER — Other Ambulatory Visit: Payer: Self-pay

## 2023-04-04 ENCOUNTER — Other Ambulatory Visit (HOSPITAL_COMMUNITY): Payer: Self-pay

## 2023-04-04 DIAGNOSIS — G8929 Other chronic pain: Secondary | ICD-10-CM

## 2023-04-04 DIAGNOSIS — R109 Unspecified abdominal pain: Secondary | ICD-10-CM

## 2023-04-04 MED ORDER — TIZANIDINE HCL 4 MG PO TABS
4.0000 mg | ORAL_TABLET | Freq: Three times a day (TID) | ORAL | 0 refills | Status: AC | PRN
  Filled 2023-04-04 (×3): qty 30, 10d supply, fill #0

## 2023-04-04 MED ORDER — DICYCLOMINE HCL 10 MG PO CAPS
10.0000 mg | ORAL_CAPSULE | Freq: Three times a day (TID) | ORAL | 1 refills | Status: AC
  Filled 2023-04-04 (×3): qty 90, 30d supply, fill #0

## 2023-04-04 MED ORDER — TRIAMCINOLONE ACETONIDE 0.5 % EX OINT
TOPICAL_OINTMENT | Freq: Three times a day (TID) | CUTANEOUS | 1 refills | Status: AC
  Filled 2023-04-04 (×2): qty 60, 30d supply, fill #0
  Filled 2023-04-04: qty 60, fill #0

## 2023-04-27 ENCOUNTER — Other Ambulatory Visit: Payer: Self-pay | Admitting: Nurse Practitioner

## 2023-04-27 DIAGNOSIS — F5101 Primary insomnia: Secondary | ICD-10-CM

## 2023-05-03 ENCOUNTER — Other Ambulatory Visit (HOSPITAL_COMMUNITY): Payer: Self-pay

## 2023-05-03 ENCOUNTER — Other Ambulatory Visit: Payer: Self-pay | Admitting: Nurse Practitioner

## 2023-05-03 DIAGNOSIS — G8929 Other chronic pain: Secondary | ICD-10-CM

## 2023-05-18 ENCOUNTER — Other Ambulatory Visit (HOSPITAL_COMMUNITY): Payer: Self-pay
